# Patient Record
Sex: Female | Born: 1975 | Race: White | Hispanic: No | Marital: Married | Smoking: Former smoker
Health system: Southern US, Community
[De-identification: ages and names within clinical notes are randomized; demographics above are authoritative.]

## PROBLEM LIST (undated history)

## (undated) DIAGNOSIS — F419 Anxiety disorder, unspecified: Secondary | ICD-10-CM

## (undated) DIAGNOSIS — E079 Disorder of thyroid, unspecified: Secondary | ICD-10-CM

## (undated) DIAGNOSIS — I10 Essential (primary) hypertension: Secondary | ICD-10-CM

## (undated) DIAGNOSIS — F329 Major depressive disorder, single episode, unspecified: Secondary | ICD-10-CM

## (undated) DIAGNOSIS — F32A Depression, unspecified: Secondary | ICD-10-CM

## (undated) DIAGNOSIS — E039 Hypothyroidism, unspecified: Secondary | ICD-10-CM

## (undated) HISTORY — DX: Anxiety disorder, unspecified: F41.9

## (undated) HISTORY — DX: Hypothyroidism, unspecified: E03.9

---
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
## 2002-05-27 ENCOUNTER — Other Ambulatory Visit: Admission: RE | Admit: 2002-05-27 | Discharge: 2002-05-27 | Payer: Self-pay | Admitting: Obstetrics and Gynecology

## 2002-10-01 ENCOUNTER — Encounter: Payer: Self-pay | Admitting: Obstetrics and Gynecology

## 2002-10-01 ENCOUNTER — Ambulatory Visit (HOSPITAL_COMMUNITY): Admission: RE | Admit: 2002-10-01 | Discharge: 2002-10-01 | Payer: Self-pay | Admitting: Obstetrics and Gynecology

## 2002-12-02 ENCOUNTER — Inpatient Hospital Stay (HOSPITAL_COMMUNITY): Admission: AD | Admit: 2002-12-02 | Discharge: 2002-12-02 | Payer: Self-pay | Admitting: Obstetrics and Gynecology

## 2003-02-16 ENCOUNTER — Inpatient Hospital Stay (HOSPITAL_COMMUNITY): Admission: AD | Admit: 2003-02-16 | Discharge: 2003-02-17 | Payer: Self-pay | Admitting: Obstetrics and Gynecology

## 2003-02-25 ENCOUNTER — Inpatient Hospital Stay (HOSPITAL_COMMUNITY): Admission: AD | Admit: 2003-02-25 | Discharge: 2003-02-28 | Payer: Self-pay | Admitting: Obstetrics and Gynecology

## 2003-03-01 ENCOUNTER — Encounter: Admission: RE | Admit: 2003-03-01 | Discharge: 2003-03-31 | Payer: Self-pay | Admitting: Obstetrics and Gynecology

## 2003-08-08 ENCOUNTER — Other Ambulatory Visit: Admission: RE | Admit: 2003-08-08 | Discharge: 2003-08-08 | Payer: Self-pay | Admitting: Obstetrics and Gynecology

## 2004-06-08 ENCOUNTER — Other Ambulatory Visit: Admission: RE | Admit: 2004-06-08 | Discharge: 2004-06-08 | Payer: Self-pay | Admitting: Obstetrics and Gynecology

## 2005-06-15 ENCOUNTER — Other Ambulatory Visit: Admission: RE | Admit: 2005-06-15 | Discharge: 2005-06-15 | Payer: Self-pay | Admitting: Obstetrics and Gynecology

## 2009-04-24 ENCOUNTER — Inpatient Hospital Stay (HOSPITAL_COMMUNITY): Admission: AD | Admit: 2009-04-24 | Discharge: 2009-04-24 | Payer: Self-pay | Admitting: Obstetrics and Gynecology

## 2009-05-01 ENCOUNTER — Inpatient Hospital Stay (HOSPITAL_COMMUNITY): Admission: RE | Admit: 2009-05-01 | Discharge: 2009-05-04 | Payer: Self-pay | Admitting: Obstetrics and Gynecology

## 2009-05-01 ENCOUNTER — Encounter (INDEPENDENT_AMBULATORY_CARE_PROVIDER_SITE_OTHER): Payer: Self-pay | Admitting: Obstetrics and Gynecology

## 2010-02-14 DIAGNOSIS — M1990 Unspecified osteoarthritis, unspecified site: Secondary | ICD-10-CM

## 2010-02-14 HISTORY — DX: Unspecified osteoarthritis, unspecified site: M19.90

## 2010-05-09 LAB — CBC
Hemoglobin: 14.1 g/dL (ref 12.0–15.0)
MCHC: 33.8 g/dL (ref 30.0–36.0)
MCV: 94.6 fL (ref 78.0–100.0)
RBC: 4.42 MIL/uL (ref 3.87–5.11)
WBC: 12.1 10*3/uL — ABNORMAL HIGH (ref 4.0–10.5)
WBC: 13.1 10*3/uL — ABNORMAL HIGH (ref 4.0–10.5)

## 2010-05-09 LAB — RPR: RPR Ser Ql: NONREACTIVE

## 2010-06-18 ENCOUNTER — Other Ambulatory Visit: Payer: Self-pay | Admitting: Obstetrics and Gynecology

## 2010-06-18 ENCOUNTER — Ambulatory Visit (HOSPITAL_COMMUNITY)
Admission: RE | Admit: 2010-06-18 | Discharge: 2010-06-18 | Disposition: A | Discharge status: Home / Self Care | Payer: BC Managed Care – PPO | Source: Ambulatory Visit | Attending: Obstetrics and Gynecology | Admitting: Obstetrics and Gynecology

## 2010-06-18 DIAGNOSIS — N920 Excessive and frequent menstruation with regular cycle: Secondary | ICD-10-CM | POA: Insufficient documentation

## 2010-06-18 LAB — CBC
HCT: 40.6 % (ref 36.0–46.0)
Hemoglobin: 13.5 g/dL (ref 12.0–15.0)
MCH: 30.1 pg (ref 26.0–34.0)
MCV: 90.4 fL (ref 78.0–100.0)
Platelets: 225 10*3/uL (ref 150–400)
RBC: 4.49 MIL/uL (ref 3.87–5.11)
RDW: 12.5 % (ref 11.5–15.5)

## 2010-06-22 NOTE — Op Note (Signed)
  NAMECHERREE, Anna Glover NO.:  [id number]  MEDICAL RECORD NO.:  [id number]           PATIENT TYPE:  O  LOCATION:  [facility]                          [facility]  PHYSICIAN:  Sherron Monday, MD        DATE OF BIRTH:  1975/10/05  DATE OF PROCEDURE:  06/18/2010 DATE OF DISCHARGE:                              OPERATIVE REPORT   PREOPERATIVE DIAGNOSIS:  Menorrhagia.  POSTOPERATIVE DIAGNOSIS:  Menorrhagia.  PROCEDURE:  Diagnostic hysteroscopy, dilation and curettage, and NovaSure endometrial ablation.  SURGEON:  Sherron Monday, MD  ANESTHESIA:  General by LMA as well as 14 mL of 1% lidocaine for local anesthetic and paracervical block.  FINDINGS:  Uterus sounds to 10 cm, cervix 4 cm, NovaSure endometrial ablation time 90 seconds with a 25 mL deficit on the hysteroscopy.  SPECIMENS:  Endometrial curettings to Pathology.  ESTIMATED BLOOD LOSS:  Minimal.  IV FLUIDS:  1200 mL.  URINE OUTPUT:  I and O cath prior to the procedure. COMPLICATIONS:  None.  DISPOSITION:  Stable to PACU following the procedure.  PROCEDURE:  After informed consent was reviewed with the patient including risks, benefits, and alternatives of the procedure including but not limited to bleeding, infection, damage to her uterus, and continued bleeding, she was transported to the OR and placed on the table in supine position.  General anesthesia was introduced and the LMA placed and this was found to be adequate.  She was then placed in the Yelofin stirrups, prepped and draped in the normal sterile fashion using an open-sided speculum, her cervix was easily identified and paracervical block was placed at approximately, 12  o'clock, 5 o'clock, and 7 o'clock of 14 mL.  Her uterus was sounded to 10 cm.  Her cervix was then dilated to accommodate the hysteroscope.  A brief survey was performed revealing normal ostia no obvious polyps or fibroids.  The hysteroscope was removed from her uterus.  A D  and C was performed for grittiness in all four quadrants.  The NovaSure device was then placed during the dilatation.  Her cervix was then measured to be 4 cm.  The device was placed, tested, and the NovaSure ablation was performed.  Instruments were removed from her vagina, and the patient tolerated the procedure well.  Sponge, lap, and needle counts were correct x2 at the end of the procedure.  She was awakened in stable condition and transferred to the PACU.     Sherron Monday, MD     JB/MEDQ  D:  06/18/2010  T:  06/19/2010  Job:  [id number]  Electronically Signed by Sherron Monday MD on 06/22/2010 08:54:53 AM

## 2010-06-22 NOTE — H&P (Signed)
  NAMEDORTHIA, TOUT NO.:  [id number]  MEDICAL RECORD NO.:  [id number]           PATIENT TYPE:  O  LOCATION:  [facility]                           [facility]  PHYSICIAN:  Sherron Monday, MD        DATE OF BIRTH:  [birth year]  DATE OF ADMISSION:  05/25/2010 DATE OF DISCHARGE:                             HISTORY & PHYSICAL   ADMITTING DIAGNOSIS:  Menorrhagia.  PROCEDURE PLANNED:  Hysteroscopy, D and C, NovaSure endometrial ablation.  HISTORY OF PRESENT ILLNESS:  A 35 year old G2, P2 who presents with heavy irregular menses, desirous of long-term management as she has had a tubal ligation.  She has no desire for childbearing, does not want hormonal contraception, so the patient is desirous of NovaSure endometrial ablation, but was unable to tolerate a saline infusion ultrasound in the office or biopsy, therefore a hysteroscopy, D and C will be performed prior to the NovaSure endometrial ablation.  Discussed with the patient risks, benefits and alternatives of procedure including, but not limited to bleeding, infection, damage to surrounding organs and continued bleeding.  She voiced understanding and wishes to proceed.  PAST MEDICAL HISTORY:  Significant for irritable bowel syndrome, hypothyroid and depression.  PAST SURGICAL HISTORY:  Significant for C-section x2 as well as a tubal ligation.  PAST OB-GYN HISTORY:  G2, P0 with two term low transverse cesarean section.  MEDICATIONS:  Zoloft, Claritin and Synthroid.  ALLERGIES:  ERYTHROMYCIN AND PENICILLIN.  SOCIAL HISTORY:  Denies alcohol, tobacco or drug use.  She has no history of any abnormal Pap smears or sexually transmitted diseases. 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  FAMILY HISTORY:  Significant for heart disease and hypertension.  PHYSICAL EXAM:  VITAL SIGNS:  Afebrile.  Vital signs stable. GENERAL:  No apparent distress. CARDIOVASCULAR:  Regular rate and rhythm. LUNGS:  Clear to auscultation  bilaterally. ABDOMEN:  Soft, nontender, nondistended with good bowel sounds. EXTREMITIES:  Symmetric and nontender. PELVIC:  Normal external female genitalia.  Normal Bartholin's, urethra, Skene glands.  Cervix and vagina without lesions.  No cervical motion tenderness.  Normal uterus.  Adnexa, no masses, nontender.  ASSESSMENT AND PLAN:  A 35 year old G2, P2 for hysteroscopy, D and C and NovaSure endometrial ablation.  She voices understanding of the risks, benefits, and alternatives and wishes to proceed to control her heavy bleeding.     Sherron Monday, MD     JB/MEDQ  D:  06/17/2010  T:  06/17/2010  Job:  [id number]  Electronically Signed by Sherron Monday MD on 06/22/2010 08:54:45 AM

## 2010-07-02 NOTE — Discharge Summary (Signed)
NAME:  Anna Glover, Anna Glover                        ACCOUNT NO.:  [id number]   MEDICAL RECORD NO.:  [id number]                   PATIENT TYPE:  INP   LOCATION:  9141                                 [facility]  [facility]   PHYSICIAN:  Huel Cote, M.D.              DATE OF BIRTH:  1976/01/18   DATE OF ADMISSION:  02/25/2003  DATE OF DISCHARGE:  02/28/2003                                 DISCHARGE SUMMARY   DISCHARGE DIAGNOSES:  1. Term pregnancy at 39 weeks, delivered.  2. Status post primary low transverse cesarean section for arrest of     dilation and fetal intolerance of labor.   DISCHARGE MEDICATIONS:  1. Motrin 600 mg p.o. q.6h.  2. Percocet one to two tablets p.o. q.4h. p.r.n.   FOLLOW UP:  The patient is to follow up in the office in approximately two  weeks for an incision check.   HOSPITAL COURSE:  The patient is a 35 year old G1, P0 who was admitted at 39  weeks for induction of labor, given a favorable cervix and ongoing symptoms  of prodromal labor.  Prenatal care had been complicated by second trimester  bleeding from an endocervical polyp which resolved and also positive group B  Strep status.   Prenatal labs are as follows:  A positive, antibody negative, RPR  nonreactive, rubella immune.  Hepatitis B surface antigen negative.  HIV  negative.  GC negative.  Chlamydia negative.  Triple screen negative.  Group  B Strep positive.   PAST OBSTETRICAL HISTORY:  None.   PAST GYNECOLOGICAL HISTORY:  None.   PAST SURGICAL HISTORY:  1. Adenoidectomy.  2. Fracture of the right arm.   PAST MEDICAL HISTORY:  None.   ALLERGIES:  1. ERYTHROMYCIN.  2. SUDAFED.   On admission she was afebrile with stable vital signs.  Fetal heart rate was  reactive.  She was gravid and nontender.  Cervix was 50% effaced, 2 cm and -  1 station.  She had ruptured membranes performed with clear fluid obtained  and was placed on Pitocin per protocol.  She advanced to 4 cm and received  an  epidural for discomfort.  She then began to have much slower progress and  an IUPC was placed when the patient was approximately 5 cm for monitoring of  contractions and fetal decelerations.  The patient was noted to have fetal  heart rate with occasional variable decelerations which did respond to  positioning and oxygen.  However, had not achieved adequate labor by her  IUPC at that time.  With increasing Pitocin, it was found that the fetal  heart rate began to have decreased variability and repetitive decelerations.  Cervix reached 80%, 6 to 7 and 0 station and did not progress beyond that  despite increasing Pitocin.  The patient had a dysfunctional labor pattern  and fetal heart rate did not tolerate increasing Pitocin further.  For that  reason, we decided  to proceed with a low transverse cesarean section.  The  patient was delivered of a vigorous female infant, Apgars were 8 and 9, weight  was 7 pounds 15 ounces, and the patient did well without postoperative  complications.   On postoperative day #1, her hemoglobin was 12.2.  She continued to improve.   On postoperative day #3, was tolerating a regular diet, pain was well  controlled and she felt very well and was able to be discharged home.                                               Huel Cote, M.D.    KR/MEDQ  D:  04/07/2003  T:  04/07/2003  Job:  [id number]

## 2010-07-02 NOTE — Op Note (Signed)
NAME:  Anna Glover, Anna Glover                        ACCOUNT NO.:  [id number]   MEDICAL RECORD NO.:  [id number]                   PATIENT TYPE:  INP   LOCATION:  9141                                 [facility]   PHYSICIAN:  Huel Cote, M.D.              DATE OF BIRTH:  1975-09-24   DATE OF PROCEDURE:  02/25/2003  DATE OF DISCHARGE:                                 OPERATIVE REPORT   PREOPERATIVE DIAGNOSES:  1. Term pregnancy at 39 weeks.  2. Fetal intolerance to labor.  3. Arrest of dilation.   POSTOPERATIVE DIAGNOSES:  1. Term pregnancy at 39 weeks.  2. Fetal intolerance to labor.  3. Arrest of dilation.   PROCEDURE:  Primary low transverse cesarean section.   SURGEON:  Huel Cote, M.D.   ANESTHESIA:  Epidural.   FLUIDS:  Estimated blood loss 200 mL.   FINDINGS:  A vigorous female infant in the vertex presentation.  Apgars are 8  and 9.  Weight was 7 pounds, 15 ounces.  Normal uterus, tubes, and ovaries  were noted.   DESCRIPTION OF PROCEDURE:  The patient was taken to the operating room where  epidural anesthesia was found to be adequate by Allis clamp test.  She was  then prepped and draped in the normal sterile fashion in the dorsal supine  position with a leftward tilt.  A Pfannenstiel skin incision was then made  with the scalpel and carried through to the underlying layer of fascia with  a knife and Bovie cautery, and the fascia was then nicked in the midline.  The incision was then extended laterally with Mayo scissors, and the  inferior aspect was grasped with Kocher clamps, elevated, and dissected off  the underlying rectus muscles.  In a similar fashion, the superior aspect of  the fascia was dissected off the rectus muscle.  The rectus muscles were  then separated in the midline, the peritoneum identified, and entered  sharply.  The peritoneal incision was then extended both superiorly and  inferiorly with careful attention to avoid both bowel and  bladder.  The  lower uterine segment was then exposed, and the uterus was increased in a  transverse fashion in the lower uterine segment.  The cavity itself was  entered bluntly, and the incision was extended digitally.  The infant's head  was quite low in the birth canal and quite molded and was delivered  atraumatically and bulb suctioned with the remainder of the body delivered  without difficulty.  The cord was clamped and cut, and the infant was handed  to awaiting pediatricians.  Cord blood was then obtained, and the placenta  was delivered manually.  The uterus was cleared of all clots and debris with  a moist lap sponge, and the incision inspected carefully.  At the right  aspect of the incision, there was an extension inferiorly approximately 3 cm  below the incision.  This was grasped  with an Allis clamp and the uterus  exteriorized for better visualization.  This extension was then repaired  with 0 chromic in a running locked fashion.  The remainder of the uterine  incision was repaired in the normal fashion with 0 chromic in a running  fashion.  The incision appeared hemostatic and therefore the uterus was re-  placed in the abdomen.  The incision was again closely inspected as well as  the extension.  One small area of bleeding at the extension was controlled  with one figure-of-eight suture of 0 Vicryl.  All instruments and sponges  were then removed from the patient's abdomen, and the uterine incision was  completely hemostatic.  The subfascial planes were inspected and found to be  hemostatic; therefore, the fascia was closed with 0 Vicryl in a running  fashion.  Subcutaneous tissue was reapproximated with 3-0 plain in a  subcutaneous stitch and finally the skin was closed with staples.  Sponge,  lap, and needle counts were correct x 2, and the patient was taken to the  recovery room in stable condition.                                               Huel Cote,  M.D.    KR/MEDQ  D:  02/25/2003  T:  02/25/2003  Job:  [id number]

## 2011-09-19 ENCOUNTER — Other Ambulatory Visit: Payer: Self-pay | Admitting: Obstetrics and Gynecology

## 2011-09-19 DIAGNOSIS — N6452 Nipple discharge: Secondary | ICD-10-CM

## 2011-09-27 ENCOUNTER — Ambulatory Visit
Admission: RE | Admit: 2011-09-27 | Discharge: 2011-09-27 | Disposition: A | Discharge status: Home / Self Care | Payer: BC Managed Care – PPO | Source: Ambulatory Visit | Attending: Obstetrics and Gynecology | Admitting: Obstetrics and Gynecology

## 2011-09-27 DIAGNOSIS — N6452 Nipple discharge: Secondary | ICD-10-CM

## 2012-01-17 ENCOUNTER — Encounter (HOSPITAL_BASED_OUTPATIENT_CLINIC_OR_DEPARTMENT_OTHER): Payer: Self-pay

## 2012-01-17 ENCOUNTER — Emergency Department (HOSPITAL_BASED_OUTPATIENT_CLINIC_OR_DEPARTMENT_OTHER): Payer: BC Managed Care – PPO

## 2012-01-17 ENCOUNTER — Emergency Department (HOSPITAL_BASED_OUTPATIENT_CLINIC_OR_DEPARTMENT_OTHER)
Admission: EM | Admit: 2012-01-17 | Discharge: 2012-01-17 | Disposition: A | Discharge status: Home / Self Care | Payer: BC Managed Care – PPO | Attending: Emergency Medicine | Admitting: Emergency Medicine

## 2012-01-17 DIAGNOSIS — F411 Generalized anxiety disorder: Secondary | ICD-10-CM | POA: Insufficient documentation

## 2012-01-17 DIAGNOSIS — R0989 Other specified symptoms and signs involving the circulatory and respiratory systems: Secondary | ICD-10-CM | POA: Insufficient documentation

## 2012-01-17 DIAGNOSIS — R0789 Other chest pain: Secondary | ICD-10-CM

## 2012-01-17 DIAGNOSIS — E079 Disorder of thyroid, unspecified: Secondary | ICD-10-CM | POA: Insufficient documentation

## 2012-01-17 DIAGNOSIS — Z79899 Other long term (current) drug therapy: Secondary | ICD-10-CM | POA: Insufficient documentation

## 2012-01-17 DIAGNOSIS — F3289 Other specified depressive episodes: Secondary | ICD-10-CM | POA: Insufficient documentation

## 2012-01-17 DIAGNOSIS — F329 Major depressive disorder, single episode, unspecified: Secondary | ICD-10-CM | POA: Insufficient documentation

## 2012-01-17 DIAGNOSIS — R064 Hyperventilation: Secondary | ICD-10-CM | POA: Insufficient documentation

## 2012-01-17 DIAGNOSIS — R0609 Other forms of dyspnea: Secondary | ICD-10-CM | POA: Insufficient documentation

## 2012-01-17 DIAGNOSIS — R209 Unspecified disturbances of skin sensation: Secondary | ICD-10-CM | POA: Insufficient documentation

## 2012-01-17 HISTORY — DX: Depression, unspecified: F32.A

## 2012-01-17 HISTORY — DX: Disorder of thyroid, unspecified: E07.9

## 2012-01-17 HISTORY — DX: Major depressive disorder, single episode, unspecified: F32.9

## 2012-01-17 LAB — CBC WITH DIFFERENTIAL/PLATELET
Basophils Absolute: 0 10*3/uL (ref 0.0–0.1)
Basophils Relative: 0 % (ref 0–1)
Eosinophils Absolute: 0.2 10*3/uL (ref 0.0–0.7)
MCH: 30.9 pg (ref 26.0–34.0)
MCHC: 35.9 g/dL (ref 30.0–36.0)
Neutrophils Relative %: 59 % (ref 43–77)
Platelets: 173 10*3/uL (ref 150–400)

## 2012-01-17 LAB — COMPREHENSIVE METABOLIC PANEL
ALT: 31 U/L (ref 0–35)
Albumin: 4.3 g/dL (ref 3.5–5.2)
Alkaline Phosphatase: 58 U/L (ref 39–117)
BUN: 16 mg/dL (ref 6–23)
Potassium: 3.2 mEq/L — ABNORMAL LOW (ref 3.5–5.1)
Sodium: 141 mEq/L (ref 135–145)
Total Protein: 7.5 g/dL (ref 6.0–8.3)

## 2012-01-17 LAB — TROPONIN I: Troponin I: <0.3 ng/mL (ref <0.30)

## 2012-01-17 NOTE — ED Provider Notes (Addendum)
History     CSN: [id number]  Arrival date & time 01/17/12  2206   First MD Initiated Contact with Patient 01/17/12 2218      Chief Complaint  Patient presents with  . Chest Pain    (Consider location/radiation/quality/duration/timing/severity/associated sxs/prior treatment) HPI Comments: Patient started with pressure in the chest about one two hours ago while knitting.  She then had difficulty catching her breath and developed tingling all over.  She denies nausea or radiation to the arm or jaw.  She denies any recent exertional symptoms.  She reports to me she ran a marathon last year but has not been as active recently due to a knee injury.  She does not smoke, is not on medications for DM, HTN, or Cholesterol.  She does have a family history.    Patient is a 36 y.o. female presenting with chest pain. The history is provided by the patient.  Chest Pain The chest pain began 1 - 2 hours ago. Chest pain occurs constantly. The chest pain is improving. The pain is associated with breathing. The severity of the pain is severe. The quality of the pain is described as pressure-like. The pain does not radiate.     Past Medical History  Diagnosis Date  . Thyroid disease   . Depression     Past Surgical History  Procedure Date  . Cesarean section     No family history on file.  History  Substance Use Topics  . Smoking status: Never Smoker   . Smokeless tobacco: Not on file  . Alcohol Use: No    OB History    Grav Para Term Preterm Abortions TAB SAB Ect Mult Living                  Review of Systems  Cardiovascular: Positive for chest pain.  All other systems reviewed and are negative.    Allergies  Erythromycin and Penicillins  Home Medications   Current Outpatient Rx  Name  Route  Sig  Dispense  Refill  . LEVOTHYROXINE SODIUM 137 MCG PO TABS   Oral   Take 137 mcg by mouth daily.         . SERTRALINE HCL 50 MG PO TABS   Oral   Take 50 mg by mouth  daily.           BP 126/72  Pulse 67  Temp 97.5 F (36.4 C) (Oral)  Resp 20  Ht 6' (1.829 m)  Wt 215 lb (97.523 kg)  BMI 29.16 kg/m2  SpO2 100%  LMP 12/27/2011  Physical Exam  Nursing note and vitals reviewed. Constitutional: She is oriented to person, place, and time. She appears well-developed and well-nourished. No distress.       Patient is anxious and hyperventilating.    HENT:  Head: Normocephalic and atraumatic.  Neck: Normal range of motion. Neck supple.  Cardiovascular: Normal rate and regular rhythm.  Exam reveals no gallop and no friction rub.   No murmur heard. Pulmonary/Chest: Effort normal and breath sounds normal. No respiratory distress. She has no wheezes.  Abdominal: Soft. Bowel sounds are normal. She exhibits no distension. There is no tenderness.  Musculoskeletal: Normal range of motion. She exhibits no edema.  Neurological: She is alert and oriented to person, place, and time.  Skin: Skin is warm and dry. She is not diaphoretic.    ED Course  Procedures (including critical care time)   Labs Reviewed  CBC WITH DIFFERENTIAL  COMPREHENSIVE METABOLIC  PANEL  TROPONIN I   No results found.   No diagnosis found.   Date: 01/17/2012  Rate: 62  Rhythm: normal sinus rhythm  QRS Axis: normal  Intervals: normal  ST/T Wave abnormalities: normal  Conduction Disutrbances:none  Narrative Interpretation:   Old EKG Reviewed: none available    MDM  The patient presents here with tightness in the chest, shortness of breath, and tingling in her extremities.  She was hyperventilating when she arrived here and looked every bit a panic attack.  EKG was normal and the troponin is negative.  She is now feeling better.  I do not believe that any further workup is indicated at this time.  Doubt cardiac etiology.  Also doubt PE.  Her oxygen sats are 100% and she is not tachycardic.  She will be discharged to home, to return prn.     Geoffery Lyons, MD 01/17/12  4696  Geoffery Lyons, MD 01/17/12 2325

## 2012-01-17 NOTE — ED Notes (Signed)
CP x 1 hour-episode of same one week ago-did not seek medical treatment at that time

## 2012-01-17 NOTE — ED Notes (Signed)
Patient transported to X-ray 

## 2012-07-15 HISTORY — PX: CHOLECYSTECTOMY: SHX55

## 2013-07-10 ENCOUNTER — Encounter (INDEPENDENT_AMBULATORY_CARE_PROVIDER_SITE_OTHER): Payer: Self-pay | Admitting: General Surgery

## 2013-07-10 ENCOUNTER — Ambulatory Visit (INDEPENDENT_AMBULATORY_CARE_PROVIDER_SITE_OTHER): Payer: BC Managed Care – PPO | Admitting: General Surgery

## 2013-07-10 VITALS — BP 120/78 | HR 70 | Temp 97.6°F | Resp 16 | Ht 72.0 in | Wt 200.4 lb

## 2013-07-10 DIAGNOSIS — R222 Localized swelling, mass and lump, trunk: Secondary | ICD-10-CM

## 2013-07-10 DIAGNOSIS — R1900 Intra-abdominal and pelvic swelling, mass and lump, unspecified site: Secondary | ICD-10-CM

## 2013-07-10 NOTE — Progress Notes (Signed)
Patient ID: Anna Glover, female   DOB: Jul 17, 1975, 38 y.o.   MRN: [id number]  Chief Complaint  Patient presents with  . New Evaluation    eval hernia VS lipoma abd wall    HPI Anna Glover is a 38 y.o. female.  Patient is a 38 year old female who was referred by Dr. Elmon KirschnerBrovard secondary 2 right lower quadrant nodule. This is just medial to her ASIS. She states it is tender to touch at times. Patient states that it has slowly gotten bigger over the last 2 months. HPI  Past Medical History  Diagnosis Date  . Thyroid disease   . Depression     Past Surgical History  Procedure Laterality Date  . Cesarean section      History reviewed. No pertinent family history.  Social History History  Substance Use Topics  . Smoking status: Former Smoker    Quit date: 07/10/2009  . Smokeless tobacco: Not on file  . Alcohol Use: 0.6 oz/week    1 Glasses of wine per week     Comment: occ    Allergies  Allergen Reactions  . Cefdinir Other (See Comments)    Rectal bleeding  . Erythromycin Nausea And Vomiting  . Penicillins Hives  . Sulfur Other (See Comments)    Rectal bleeding    Current Outpatient Prescriptions  Medication Sig Dispense Refill  . Biotin 1000 MCG tablet Take 1,000 mcg by mouth 1 day or 1 dose.      . levothyroxine (SYNTHROID, LEVOTHROID) 137 MCG tablet Take 137 mcg by mouth daily.      . Loratadine (CLARITIN) 10 MG CAPS Take by mouth 1 day or 1 dose.      Marland Kitchen. Lysine 1000 MG TABS Take by mouth.      . sertraline (ZOLOFT) 50 MG tablet Take 50 mg by mouth daily.       No current facility-administered medications for this visit.    Review of Systems Review of Systems  Constitutional: Negative.   HENT: Negative.   Respiratory: Negative.   Cardiovascular: Negative.   Gastrointestinal: Negative.   Neurological: Negative.   All other systems reviewed and are negative.   Blood pressure 120/78, pulse 70, temperature 97.6 F (36.4 C), temperature source  Temporal, resp. rate 16, height 6' (1.829 m), weight 200 lb 6.4 oz (90.901 kg).  Physical Exam Physical Exam  Constitutional: She is oriented to person, place, and time. She appears well-developed and well-nourished.  HENT:  Head: Normocephalic and atraumatic.  Eyes: Conjunctivae and EOM are normal. Pupils are equal, round, and reactive to light.  Neck: Normal range of motion. Neck supple.  Cardiovascular: Normal rate, regular rhythm and normal heart sounds.   Pulmonary/Chest: Effort normal and breath sounds normal.  Abdominal: Soft. Bowel sounds are normal. She exhibits no distension and no mass. There is no tenderness. There is no rebound and no guarding. Hernia confirmed negative in the ventral area.    2 x 2 centimeter, mobile, nontender mass  Musculoskeletal: Normal range of motion.  Neurological: She is alert and oriented to person, place, and time.  Skin: Skin is warm and dry.  Psychiatric: She has a normal mood and affect.    Data Reviewed none  Assessment    38 year old female with a right lower quadrant abdominal wall mass, versus cyst, versus lymph node    Plan    1. Will obtain an ultrasound of this nodule to evaluate for possible lipoma versus cyst versus lymph node. Once we  obtain the results we will call patient with the results and discuss possible further excision of this mass.        Axel Filler 07/10/2013, 3:44 PM

## 2013-07-10 NOTE — Addendum Note (Signed)
Addended by: Maryan Puls on: 07/10/2013 05:36 PM   Modules accepted: Orders

## 2013-07-11 ENCOUNTER — Telehealth (INDEPENDENT_AMBULATORY_CARE_PROVIDER_SITE_OTHER): Payer: Self-pay | Admitting: *Deleted

## 2013-07-11 NOTE — Telephone Encounter (Signed)
LM for pt to return my call regarding her [place]. Please advise pt it is scheduled at [facility] [address]  Appt: 07/12/13 arrive at 10:15 and NPO after midnight tonight.  Thanks!  Victorino Dike

## 2013-07-11 NOTE — Telephone Encounter (Signed)
Patient called back and the appt information below was given.  Patient verbalized understanding.

## 2013-07-12 ENCOUNTER — Other Ambulatory Visit (INDEPENDENT_AMBULATORY_CARE_PROVIDER_SITE_OTHER): Payer: Self-pay | Admitting: General Surgery

## 2013-07-12 ENCOUNTER — Ambulatory Visit
Admission: RE | Admit: 2013-07-12 | Discharge: 2013-07-12 | Disposition: A | Discharge status: Home / Self Care | Payer: BC Managed Care – PPO | Source: Ambulatory Visit | Attending: General Surgery | Admitting: General Surgery

## 2013-07-12 DIAGNOSIS — R222 Localized swelling, mass and lump, trunk: Secondary | ICD-10-CM

## 2013-07-15 ENCOUNTER — Telehealth (INDEPENDENT_AMBULATORY_CARE_PROVIDER_SITE_OTHER): Payer: Self-pay

## 2013-07-15 NOTE — Telephone Encounter (Signed)
Message copied by Maryan Puls on Mon Jul 15, 2013  9:23 AM ------      Message from: Axel Filler      Created: Sat Jul 13, 2013  8:40 AM       Can you call her and let her know that it is likely a lipoma.  If she wants surgery we can set it up and she doesn't have to come in.            Let me know            AR      ----- Message -----         From: Rad Results In Interface         Sent: 07/12/2013  11:49 AM           To: Axel Filler, MD                   ------

## 2013-07-15 NOTE — Telephone Encounter (Signed)
Called and left message for patient to call our office RE:  US findings are likely a Lipoma.  If patient would like to proceed with excision we can have our OR schedulers call to schedule her surgery.

## 2013-07-15 NOTE — Telephone Encounter (Signed)
Patient return call to the office:  Patient given [place] results (Lipoma) at this time she will not proceed with surgical excision.  Patient advised to call our office if she decides to proceed with surgery.

## 2014-06-01 ENCOUNTER — Encounter (HOSPITAL_COMMUNITY): Payer: Self-pay

## 2014-06-01 ENCOUNTER — Emergency Department (HOSPITAL_COMMUNITY)
Admission: EM | Admit: 2014-06-01 | Discharge: 2014-06-01 | Disposition: A | Discharge status: Home / Self Care | Payer: BC Managed Care – PPO | Attending: Emergency Medicine | Admitting: Emergency Medicine

## 2014-06-01 DIAGNOSIS — Z792 Long term (current) use of antibiotics: Secondary | ICD-10-CM | POA: Insufficient documentation

## 2014-06-01 DIAGNOSIS — Z79899 Other long term (current) drug therapy: Secondary | ICD-10-CM | POA: Insufficient documentation

## 2014-06-01 DIAGNOSIS — R519 Headache, unspecified: Secondary | ICD-10-CM

## 2014-06-01 DIAGNOSIS — Z880 Allergy status to penicillin: Secondary | ICD-10-CM | POA: Insufficient documentation

## 2014-06-01 DIAGNOSIS — R51 Headache: Secondary | ICD-10-CM | POA: Diagnosis present

## 2014-06-01 DIAGNOSIS — Z87891 Personal history of nicotine dependence: Secondary | ICD-10-CM | POA: Insufficient documentation

## 2014-06-01 DIAGNOSIS — M542 Cervicalgia: Secondary | ICD-10-CM | POA: Insufficient documentation

## 2014-06-01 DIAGNOSIS — E079 Disorder of thyroid, unspecified: Secondary | ICD-10-CM | POA: Diagnosis not present

## 2014-06-01 DIAGNOSIS — F329 Major depressive disorder, single episode, unspecified: Secondary | ICD-10-CM | POA: Diagnosis not present

## 2014-06-01 LAB — CBC WITH DIFFERENTIAL/PLATELET
BASOS ABS: 0.1 10*3/uL (ref 0.0–0.1)
Basophils Relative: 2 % — ABNORMAL HIGH (ref 0–1)
Eosinophils Absolute: 0.2 10*3/uL (ref 0.0–0.7)
Eosinophils Relative: 4 % (ref 0–5)
HCT: 40.9 % (ref 36.0–46.0)
HEMOGLOBIN: 14.2 g/dL (ref 12.0–15.0)
Lymphocytes Relative: 46 % (ref 12–46)
Lymphs Abs: 2.6 10*3/uL (ref 0.7–4.0)
MCH: 31.5 pg (ref 26.0–34.0)
MCHC: 34.7 g/dL (ref 30.0–36.0)
MCV: 90.7 fL (ref 78.0–100.0)
Monocytes Absolute: 0.6 10*3/uL (ref 0.1–1.0)
Monocytes Relative: 12 % (ref 3–12)
NEUTROS PCT: 36 % — AB (ref 43–77)
Neutro Abs: 1.9 10*3/uL (ref 1.7–7.7)
PLATELETS: 108 10*3/uL — AB (ref 150–400)
RBC: 4.51 MIL/uL (ref 3.87–5.11)
RDW: 13.1 % (ref 11.5–15.5)
WBC: 5.4 10*3/uL (ref 4.0–10.5)

## 2014-06-01 LAB — CSF CELL COUNT WITH DIFFERENTIAL
RBC Count, CSF: 0 /mm3
RBC Count, CSF: 0 /mm3
TUBE #: 4
Tube #: 1
WBC, CSF: 0 /mm3 (ref 0–5)
WBC, CSF: 1 /mm3 (ref 0–5)

## 2014-06-01 LAB — PROTEIN, CSF: Total  Protein, CSF: 25 mg/dL (ref 15–45)

## 2014-06-01 LAB — COMPREHENSIVE METABOLIC PANEL
ALT: 74 U/L — ABNORMAL HIGH (ref 0–35)
ANION GAP: 9 (ref 5–15)
AST: 62 U/L — ABNORMAL HIGH (ref 0–37)
Albumin: 3.4 g/dL — ABNORMAL LOW (ref 3.5–5.2)
Alkaline Phosphatase: 112 U/L (ref 39–117)
BILIRUBIN TOTAL: 0.9 mg/dL (ref 0.3–1.2)
BUN: 11 mg/dL (ref 6–23)
CALCIUM: 9 mg/dL (ref 8.4–10.5)
CHLORIDE: 105 mmol/L (ref 96–112)
CO2: 23 mmol/L (ref 19–32)
Creatinine, Ser: 0.76 mg/dL (ref 0.50–1.10)
GFR calc Af Amer: >90 mL/min (ref >90)
Glucose, Bld: 87 mg/dL (ref 70–99)
POTASSIUM: 3.7 mmol/L (ref 3.5–5.1)
Sodium: 137 mmol/L (ref 135–145)
Total Protein: 6.5 g/dL (ref 6.0–8.3)

## 2014-06-01 LAB — GLUCOSE, CSF: Glucose, CSF: 45 mg/dL (ref 43–76)

## 2014-06-01 LAB — I-STAT CG4 LACTIC ACID, ED
LACTIC ACID, VENOUS: 0.83 mmol/L (ref 0.5–2.0)
Lactic Acid, Venous: 0.67 mmol/L (ref 0.5–2.0)

## 2014-06-01 LAB — GRAM STAIN

## 2014-06-01 MED ORDER — BUTALBITAL-APAP-CAFFEINE 50-325-40 MG PO TABS: 1.0000 | ORAL_TABLET | Freq: Four times a day (QID) | ORAL | Status: AC | PRN

## 2014-06-01 MED ORDER — DIPHENHYDRAMINE HCL 50 MG/ML IJ SOLN
25.0000 mg | Freq: Once | INTRAMUSCULAR | Status: AC
Start: 2014-06-01 — End: 2014-06-01
  Administered 2014-06-01: 25 mg via INTRAVENOUS
  Filled 2014-06-01: qty 1

## 2014-06-01 MED ORDER — METOCLOPRAMIDE HCL 5 MG/ML IJ SOLN
10.0000 mg | Freq: Once | INTRAMUSCULAR | Status: AC
  Administered 2014-06-01: 10 mg via INTRAVENOUS
  Filled 2014-06-01: qty 2

## 2014-06-01 MED ORDER — SODIUM CHLORIDE 0.9 % IV BOLUS (SEPSIS)
1000.0000 mL | Freq: Once | INTRAVENOUS | Status: AC
  Administered 2014-06-01: 1000 mL via INTRAVENOUS

## 2014-06-01 MED ORDER — LIDOCAINE-EPINEPHRINE 2 %-1:100000 IJ SOLN: 20.0000 mL | Freq: Once | INTRAMUSCULAR | Status: DC

## 2014-06-01 MED ORDER — LIDOCAINE-EPINEPHRINE (PF) 2 %-1:200000 IJ SOLN
10.0000 mL | Freq: Once | INTRAMUSCULAR | Status: AC
  Administered 2014-06-01: 10 mL
  Filled 2014-06-01: qty 20

## 2014-06-01 MED ORDER — LIDOCAINE HCL (PF) 1 % IJ SOLN
5.0000 mL | Freq: Once | INTRAMUSCULAR | Status: DC
  Filled 2014-06-01: qty 5

## 2014-06-01 MED ORDER — ONDANSETRON 4 MG PO TBDP: 4.0000 mg | ORAL_TABLET | Freq: Three times a day (TID) | ORAL | Status: DC | PRN

## 2014-06-01 NOTE — ED Provider Notes (Signed)
CSN: [id number]     Arrival date & time 06/01/14  1022 History   First MD Initiated Contact with Patient 06/01/14 1044     Chief Complaint  Patient presents with  . Neck Pain  . Headache     (Consider location/radiation/quality/duration/timing/severity/associated sxs/prior Treatment) The history is provided by the patient and medical records.    This is a 39 year old female with past medical history significant for hypothyroidism, depression, presenting to the ED for headache and neck pain.  Patient states neck pain began on Monday 05/26/14.  States she attributed it to "sleeping wrong" but when she developed headache and fever the next day she went to see her PCP.   She had multiple lab tests performed including HIV, RPR, lyme titers, RMSF titers, TSH, lactic acid, etc.  Patient stated her PCP did express some concern for meningitis, but felt more likely to be due to infection. She was started on Augmentin and allergy medicine for sinusitis. 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  Lyme titers also negative.  She was started on doxycycline.  States she has taken 5 doses of the medication thus far.  States she does have some nausea, unsure if related to headache or SE of medications.  No hx of migraine headaches.  She reports associated photophobia, phonophobia, dizziness, lightheadedness, changes in speech, confusion.  Endorses some continued subjective fever and chills.  No sweats.  No chest pain or SOB.  VSS.  Past Medical History  Diagnosis Date  . Thyroid disease   . Depression    Past Surgical History  Procedure Laterality Date  . Cesarean section     No family history on file. History  Substance Use Topics  . Smoking status: Former Smoker    Quit date: 07/10/2009  . Smokeless tobacco: Not on file  . Alcohol Use: 0.6 oz/week    1 Glasses of wine per week     Comment: occ   OB History    No data  available     Review of Systems  Musculoskeletal: Positive for neck pain.  Neurological: Positive for headaches.  All other systems reviewed and are negative.     Allergies  Cefdinir; Erythromycin; Penicillins; and Sulfur  Home Medications   Prior to Admission medications   Medication Sig Start Date End Date Taking? Authorizing Provider  azithromycin (ZITHROMAX) 250 MG tablet  05/28/14   Historical Provider, MD  Biotin 1000 MCG tablet Take 1,000 mcg by mouth 1 day or 1 dose.    Historical Provider, MD  celecoxib (CELEBREX) 200 MG capsule  05/08/14   Historical Provider, MD  levothyroxine (SYNTHROID, LEVOTHROID) 137 MCG tablet Take 137 mcg by mouth daily.    Historical Provider, MD  Loratadine (CLARITIN) 10 MG CAPS Take by mouth 1 day or 1 dose.    Historical Provider, MD  Lysine 1000 MG TABS Take by mouth.    Historical Provider, MD  sertraline (ZOLOFT) 50 MG tablet Take 50 mg by mouth daily.    Historical Provider, MD   BP 131/71 mmHg  Pulse 56  Temp(Src) 98.4 F (36.9 C) (Oral)  Resp 16  Ht 6' (1.829 m)  Wt 192 lb (87.091 kg)  BMI 26.03 kg/m2  SpO2 100%  LMP 05/13/2014   Physical Exam  Constitutional: She is oriented to person, place, and time. She appears well-developed and well-nourished.  HENT:  Head: Normocephalic and atraumatic.  Mouth/Throat: Oropharynx is clear and moist.  Eyes: Conjunctivae and EOM are normal. Pupils are equal, round, and reactive to light.  Neck: Trachea normal and phonation normal. Neck supple. No spinous process tenderness and no muscular tenderness present.  Limited ROM of neck due to pain, pain greatest with flexion; no reproducible spinal or muscular tenderness  Cardiovascular: Normal rate, regular rhythm and normal heart sounds.   Pulmonary/Chest: Effort normal and breath sounds normal.  Abdominal: Soft. Bowel sounds are normal.  Musculoskeletal: Normal range of motion.  Neurological: She is alert and oriented to person, place, and  time.  AAOx3, answering questions and following commands appropriately; equal strength UE and LE bilaterally; CN grossly intact; moves all extremities appropriately without ataxia; no focal neuro deficits or facial asymmetry appreciated  Skin: Skin is warm and dry. No rash noted.  No visible rash or skin lesions  Psychiatric: She has a normal mood and affect.  Nursing note and vitals reviewed.   ED Course  Procedures (including critical care time) Labs Review Labs Reviewed  CBC WITH DIFFERENTIAL/PLATELET - Abnormal; Notable for the following:    Platelets 108 (*)    Neutrophils Relative % 36 (*)    Basophils Relative 2 (*)    All other components within normal limits  COMPREHENSIVE METABOLIC PANEL - Abnormal; Notable for the following:    Albumin 3.4 (*)    AST 62 (*)    ALT 74 (*)    All other components within normal limits  GRAM STAIN  CSF CULTURE  CULTURE, BLOOD (ROUTINE X 2)  CULTURE, BLOOD (ROUTINE X 2)  CSF CELL COUNT WITH DIFFERENTIAL  CSF CELL COUNT WITH DIFFERENTIAL  GLUCOSE, CSF  PROTEIN, CSF  I-STAT CG4 LACTIC ACID, ED  I-STAT CG4 LACTIC ACID, ED    Imaging Review No results found.   EKG Interpretation None      MDM   Final diagnoses:  Headache, unspecified headache type   39 year old female with headache, neck pain, and subjective fever. She was recently diagnosed with large amounts body fever and is currently taking doxycycline. On exam, she is afebrile and overall nontoxic in appearance.  She has limited range of motion of her neck due to pain, greatest discomfort with flexion. She has no reproducible muscular or spinal tenderness. Her neurologic exam is nonfocal.  Given known ongoing infection, will obtain basic labs and LP to r/o meningitis.  Patient given IVF with benadryl/reglan.    After migraine cocktail patient states she is feeling better, currently eating snack in room.  Basic lab work reassuring.  LP performed by Dr. Rosalia Hammersay-- CSF without signs of  infection.  Will d/c home with supportive care, patient to continue doxycycline for RMSF.  She has previously scheduled FU with PCP tomm.  Discussed plan with patient, he/she acknowledged understanding and agreed with plan of care.  Return precautions given for new or worsening symptoms  Garlon HatchetLisa M Sanders, PA-C 06/01/14 1515  Margarita Grizzleanielle Ray, MD 06/01/14 [phone]

## 2014-06-01 NOTE — ED Provider Notes (Signed)
39 year old female who presents today complaining of headache. She had a tick bite recently has been seen in her primary care office. She had [facility] and is started on doxycycline this week. She presents today complaining of headache and neck pain. Patient was seen with Ms. Sanders. Please see her note for full evaluation. Lumbar puncture performed by myself - patient informed of risks and benefits and consent obtained. Patient placed in left lateral decubitus position. Back had landmarks identified. Prepped and draped in usual sterile manner. Lidocaine with epinephrine 2 mL used for local anesthesia. #22 with a needle entered the L4-L5 space with return of clear fluid. Opening pressure was 22 4 -tubes1 mL each of fluid obtained. Stylette replaced the needle removed. Bandage placed. Tolerated procedure well.   I performed a history and physical examination of Anna Glover and discussed her management with Ms. Sanders.  I agree with the history, physical, assessment, and plan of care, with the following exceptions: None  I was present for the following procedures: None Time Spent in Critical Care of the patient: None Time spent in discussions with the patient and family: 3415  Anna Glover Hilario QuarryS    Romero Letizia, MD 06/01/14 [phone]

## 2014-06-01 NOTE — ED Notes (Signed)
Pt. With ongoing neck pain and headache x 1 week. Pt. Was bitten by a tic in the groin on 4/3. Pt. Seen at PCP Friday. Pt. Running high fever last week. Pt. Taking doxycycline for possible [place] spotted fever. Pt. Feeling nauseous/tingly.

## 2014-06-01 NOTE — ED Notes (Signed)
MD at bedside for lumbar puncture.

## 2014-06-01 NOTE — Discharge Instructions (Signed)
Take the prescribed medication as directed as needed for recurrent headache and/or nausea.  Continue taking your doxycycline as prescribed. Follow-up with your primary care physician-- recommend to call in the morning. Return to the ED for new or worsening symptoms.

## 2014-06-03 ENCOUNTER — Inpatient Hospital Stay (HOSPITAL_COMMUNITY)
Admission: EM | Admit: 2014-06-03 | Discharge: 2014-06-04 | DRG: 103 | Disposition: A | Discharge status: Home / Self Care | Payer: BC Managed Care – PPO | Attending: Internal Medicine | Admitting: Internal Medicine

## 2014-06-03 ENCOUNTER — Observation Stay (HOSPITAL_COMMUNITY): Payer: BC Managed Care – PPO

## 2014-06-03 ENCOUNTER — Encounter (HOSPITAL_COMMUNITY): Payer: Self-pay | Admitting: *Deleted

## 2014-06-03 DIAGNOSIS — E079 Disorder of thyroid, unspecified: Secondary | ICD-10-CM | POA: Diagnosis present

## 2014-06-03 DIAGNOSIS — Z880 Allergy status to penicillin: Secondary | ICD-10-CM

## 2014-06-03 DIAGNOSIS — R519 Headache, unspecified: Secondary | ICD-10-CM | POA: Diagnosis present

## 2014-06-03 DIAGNOSIS — G971 Other reaction to spinal and lumbar puncture: Secondary | ICD-10-CM | POA: Diagnosis not present

## 2014-06-03 DIAGNOSIS — Z87891 Personal history of nicotine dependence: Secondary | ICD-10-CM

## 2014-06-03 DIAGNOSIS — Z888 Allergy status to other drugs, medicaments and biological substances status: Secondary | ICD-10-CM

## 2014-06-03 DIAGNOSIS — A770 Spotted fever due to Rickettsia rickettsii: Secondary | ICD-10-CM

## 2014-06-03 DIAGNOSIS — Z882 Allergy status to sulfonamides status: Secondary | ICD-10-CM

## 2014-06-03 DIAGNOSIS — D696 Thrombocytopenia, unspecified: Secondary | ICD-10-CM | POA: Diagnosis not present

## 2014-06-03 DIAGNOSIS — R51 Headache: Secondary | ICD-10-CM

## 2014-06-03 DIAGNOSIS — R7989 Other specified abnormal findings of blood chemistry: Secondary | ICD-10-CM | POA: Diagnosis not present

## 2014-06-03 DIAGNOSIS — Z881 Allergy status to other antibiotic agents status: Secondary | ICD-10-CM

## 2014-06-03 DIAGNOSIS — R945 Abnormal results of liver function studies: Secondary | ICD-10-CM

## 2014-06-03 LAB — COMPREHENSIVE METABOLIC PANEL
ALBUMIN: 3.6 g/dL (ref 3.5–5.2)
ALT: 186 U/L — AB (ref 0–35)
ANION GAP: 7 (ref 5–15)
AST: 206 U/L — AB (ref 0–37)
Alkaline Phosphatase: 113 U/L (ref 39–117)
BILIRUBIN TOTAL: 0.8 mg/dL (ref 0.3–1.2)
BUN: 10 mg/dL (ref 6–23)
CHLORIDE: 107 mmol/L (ref 96–112)
CO2: 25 mmol/L (ref 19–32)
Calcium: 8.9 mg/dL (ref 8.4–10.5)
Creatinine, Ser: 0.85 mg/dL (ref 0.50–1.10)
GFR calc Af Amer: >90 mL/min (ref >90)
GFR calc non Af Amer: 85 mL/min — ABNORMAL LOW (ref >90)
Glucose, Bld: 87 mg/dL (ref 70–99)
Potassium: 3.8 mmol/L (ref 3.5–5.1)
Sodium: 139 mmol/L (ref 135–145)
TOTAL PROTEIN: 6.2 g/dL (ref 6.0–8.3)

## 2014-06-03 LAB — CBC WITH DIFFERENTIAL/PLATELET
BASOS ABS: 0 10*3/uL (ref 0.0–0.1)
BASOS PCT: 1 % (ref 0–1)
EOS ABS: 0.2 10*3/uL (ref 0.0–0.7)
EOS PCT: 2 % (ref 0–5)
HEMATOCRIT: 37.3 % (ref 36.0–46.0)
HEMOGLOBIN: 12.3 g/dL (ref 12.0–15.0)
LYMPHS ABS: 3.1 10*3/uL (ref 0.7–4.0)
Lymphocytes Relative: 42 % (ref 12–46)
MCH: 30.5 pg (ref 26.0–34.0)
MCHC: 33 g/dL (ref 30.0–36.0)
MCV: 92.6 fL (ref 78.0–100.0)
Monocytes Absolute: 0.4 10*3/uL (ref 0.1–1.0)
Monocytes Relative: 6 % (ref 3–12)
Neutro Abs: 3.7 10*3/uL (ref 1.7–7.7)
Neutrophils Relative %: 49 % (ref 43–77)
Platelets: 131 10*3/uL — ABNORMAL LOW (ref 150–400)
RBC: 4.03 MIL/uL (ref 3.87–5.11)
RDW: 13 % (ref 11.5–15.5)
WBC: 7.4 10*3/uL (ref 4.0–10.5)

## 2014-06-03 LAB — URINALYSIS, ROUTINE W REFLEX MICROSCOPIC
BILIRUBIN URINE: NEGATIVE
Glucose, UA: NEGATIVE mg/dL
Hgb urine dipstick: NEGATIVE
Ketones, ur: NEGATIVE mg/dL
LEUKOCYTES UA: NEGATIVE
Nitrite: NEGATIVE
PROTEIN: NEGATIVE mg/dL
Specific Gravity, Urine: 1.014 (ref 1.005–1.030)
Urobilinogen, UA: 2 mg/dL — ABNORMAL HIGH (ref 0.0–1.0)
pH: 6 (ref 5.0–8.0)

## 2014-06-03 LAB — APTT: APTT: 27 s (ref 24–37)

## 2014-06-03 LAB — PROTIME-INR
INR: 1.03 (ref 0.00–1.49)
Prothrombin Time: 13.6 seconds (ref 11.6–15.2)

## 2014-06-03 MED ORDER — HYDROMORPHONE HCL 1 MG/ML IJ SOLN: 1.0000 mg | INTRAMUSCULAR | Status: DC | PRN

## 2014-06-03 MED ORDER — SODIUM CHLORIDE 0.9 % IV SOLN: 250.0000 mL | INTRAVENOUS | Status: DC | PRN

## 2014-06-03 MED ORDER — ACETAMINOPHEN 325 MG PO TABS
650.0000 mg | ORAL_TABLET | Freq: Four times a day (QID) | ORAL | Status: DC | PRN
  Filled 2014-06-03: qty 2

## 2014-06-03 MED ORDER — SODIUM CHLORIDE 0.9 % IV BOLUS (SEPSIS)
1000.0000 mL | Freq: Once | INTRAVENOUS | Status: AC
  Administered 2014-06-03: 1000 mL via INTRAVENOUS

## 2014-06-03 MED ORDER — SODIUM CHLORIDE 0.9 % IJ SOLN: 3.0000 mL | INTRAMUSCULAR | Status: DC | PRN

## 2014-06-03 MED ORDER — DIPHENHYDRAMINE HCL 50 MG/ML IJ SOLN
25.0000 mg | Freq: Three times a day (TID) | INTRAMUSCULAR | Status: DC
  Administered 2014-06-03 – 2014-06-04 (×5): 25 mg via INTRAVENOUS
  Filled 2014-06-03 (×5): qty 1

## 2014-06-03 MED ORDER — PROCHLORPERAZINE EDISYLATE 5 MG/ML IJ SOLN
10.0000 mg | Freq: Once | INTRAMUSCULAR | Status: AC
  Administered 2014-06-03: 10 mg via INTRAVENOUS
  Filled 2014-06-03: qty 2

## 2014-06-03 MED ORDER — SERTRALINE HCL 50 MG PO TABS
50.0000 mg | ORAL_TABLET | Freq: Every day | ORAL | Status: DC
  Administered 2014-06-03 – 2014-06-04 (×2): 50 mg via ORAL
  Filled 2014-06-03 (×2): qty 1

## 2014-06-03 MED ORDER — ACETAMINOPHEN 650 MG RE SUPP
650.0000 mg | Freq: Four times a day (QID) | RECTAL | Status: DC | PRN
Start: 2014-06-03 — End: 2014-06-05

## 2014-06-03 MED ORDER — SODIUM CHLORIDE 0.9 % IV SOLN
INTRAVENOUS | Status: DC
  Administered 2014-06-03: 19:00:00 via INTRAVENOUS

## 2014-06-03 MED ORDER — SERTRALINE HCL 50 MG PO TABS: 50.0000 mg | ORAL_TABLET | Freq: Every day | ORAL | Status: DC

## 2014-06-03 MED ORDER — HYDROMORPHONE HCL 1 MG/ML IJ SOLN
0.5000 mg | Freq: Once | INTRAMUSCULAR | Status: DC
  Filled 2014-06-03: qty 1

## 2014-06-03 MED ORDER — LORAZEPAM 2 MG/ML IJ SOLN
1.0000 mg | Freq: Once | INTRAMUSCULAR | Status: AC
Start: 2014-06-03 — End: 2014-06-03
  Administered 2014-06-03: 1 mg via INTRAVENOUS
  Filled 2014-06-03: qty 1

## 2014-06-03 MED ORDER — HYDROMORPHONE HCL 1 MG/ML IJ SOLN
0.5000 mg | Freq: Once | INTRAMUSCULAR | Status: AC
  Administered 2014-06-03: 0.5 mg via INTRAVENOUS

## 2014-06-03 MED ORDER — DIPHENHYDRAMINE HCL 50 MG/ML IJ SOLN
25.0000 mg | Freq: Once | INTRAMUSCULAR | Status: AC
  Administered 2014-06-03: 25 mg via INTRAVENOUS
  Filled 2014-06-03: qty 1

## 2014-06-03 MED ORDER — METHYLPREDNISOLONE SODIUM SUCC 125 MG IJ SOLR
125.0000 mg | Freq: Once | INTRAMUSCULAR | Status: AC
  Administered 2014-06-03: 125 mg via INTRAVENOUS
  Filled 2014-06-03: qty 2

## 2014-06-03 MED ORDER — KETOROLAC TROMETHAMINE 30 MG/ML IJ SOLN
30.0000 mg | Freq: Three times a day (TID) | INTRAMUSCULAR | Status: DC
  Administered 2014-06-03 – 2014-06-04 (×5): 30 mg via INTRAVENOUS
  Filled 2014-06-03 (×5): qty 1

## 2014-06-03 MED ORDER — ACETAMINOPHEN 325 MG PO TABS
650.0000 mg | ORAL_TABLET | Freq: Four times a day (QID) | ORAL | Status: DC | PRN
  Administered 2014-06-04: 650 mg via ORAL

## 2014-06-03 MED ORDER — METOCLOPRAMIDE HCL 5 MG/ML IJ SOLN
10.0000 mg | Freq: Three times a day (TID) | INTRAMUSCULAR | Status: DC
  Administered 2014-06-03 – 2014-06-04 (×4): 10 mg via INTRAVENOUS
  Filled 2014-06-03 (×4): qty 2

## 2014-06-03 MED ORDER — ONDANSETRON HCL 4 MG/2ML IJ SOLN: 4.0000 mg | Freq: Three times a day (TID) | INTRAMUSCULAR | Status: AC | PRN

## 2014-06-03 MED ORDER — LORATADINE 10 MG PO TABS
10.0000 mg | ORAL_TABLET | Freq: Every day | ORAL | Status: DC
  Administered 2014-06-03: 10 mg via ORAL
  Filled 2014-06-03 (×3): qty 1

## 2014-06-03 MED ORDER — MAGNESIUM SULFATE 2 GM/50ML IV SOLN
2.0000 g | Freq: Once | INTRAVENOUS | Status: AC
  Administered 2014-06-03: 2 g via INTRAVENOUS
  Filled 2014-06-03: qty 50

## 2014-06-03 MED ORDER — LEVOTHYROXINE SODIUM 137 MCG PO TABS
137.0000 ug | ORAL_TABLET | Freq: Every day | ORAL | Status: DC
  Administered 2014-06-04: 137 ug via ORAL
  Filled 2014-06-03 (×4): qty 1

## 2014-06-03 MED ORDER — SODIUM CHLORIDE 0.9 % IJ SOLN
3.0000 mL | Freq: Two times a day (BID) | INTRAMUSCULAR | Status: DC
  Administered 2014-06-03 – 2014-06-04 (×2): 3 mL via INTRAVENOUS

## 2014-06-03 MED ORDER — DOXYCYCLINE HYCLATE 100 MG PO TABS
100.0000 mg | ORAL_TABLET | Freq: Two times a day (BID) | ORAL | Status: DC
  Administered 2014-06-03 – 2014-06-04 (×3): 100 mg via ORAL
  Filled 2014-06-03 (×3): qty 1

## 2014-06-03 MED ORDER — VALPROATE SODIUM 500 MG/5ML IV SOLN: 500.0000 mg | Freq: Once | INTRAVENOUS | Status: DC

## 2014-06-03 NOTE — H&P (Signed)
[facility] History and Physical  ZALEIGH BERMINGHAM ZOX:[id number] DOB: 1975/05/19 DOA: 06/03/2014  Referring physician: er PCP: Pcp Not In System   Chief Complaint: headache  HPI: Anna Glover is a 39 y.o. 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  She was started on doxy on 4/16.  She has had headache since 4/14.  Headache worsened on 4/17 and patient presented to the ER with headache and concern for meninigitis.  LP was negative.  Headache is better laying flat but has always improved with this.  Worse with sitting up, no change since LP done +photophobia and +sensitivities to sound.  No fevers since abx started.  In ER, her LFTs were elevated and headache did not improve with dilaudid, benadryl, steroids, magnesium and compazine.  Hospitalist were asked to consult   Review of Systems:  All systems reviewed, negative unless stated above    Past Medical History  Diagnosis Date  . Thyroid disease   . Depression    Past Surgical History  Procedure Laterality Date  . Cesarean section     Social History:  reports that she quit smoking about 4 years ago. She does not have any smokeless tobacco history on file. She reports that she drinks about 0.6 oz of alcohol per week. She reports that she does not use illicit drugs.  Allergies  Allergen Reactions  . Cefdinir Other (See Comments)    Rectal bleeding  . Erythromycin Nausea And Vomiting  . Penicillins Hives  . Sulfur Other (See Comments)    Rectal bleeding    Family History  Problem Relation Age of Onset  . Hypertension Mother   . Hyperlipidemia Mother   . Hypertension Father      Prior to Admission medications   Medication Sig Start Date End Date Taking? Authorizing Provider  acetaminophen (TYLENOL) 325 MG tablet Take 650 mg by mouth every 6 (six) hours as needed for mild pain or moderate pain.   Yes Historical Provider, MD  doxycycline (VIBRA-TABS) 100 MG tablet Take 100 mg by mouth 2 (two)  times daily.   Yes Historical Provider, MD  Hydrocodone-Acetaminophen 5-300 MG TABS Take 1 tablet by mouth daily as needed (headache).   Yes Historical Provider, MD  ibuprofen (ADVIL,MOTRIN) 200 MG tablet Take 200 mg by mouth every 6 (six) hours as needed for mild pain.   Yes Historical Provider, MD  levothyroxine (SYNTHROID, LEVOTHROID) 137 MCG tablet Take 137 mcg by mouth daily.   Yes Historical Provider, MD  Loratadine (CLARITIN) 10 MG CAPS Take 10 mg by mouth daily.    Yes Historical Provider, MD  sertraline (ZOLOFT) 50 MG tablet Take 50 mg by mouth daily.   Yes Historical Provider, MD  butalbital-acetaminophen-caffeine (FIORICET) 50-325-40 MG per tablet Take 1 tablet by mouth every 6 (six) hours as needed for headache. Patient not taking: Reported on 06/03/2014 06/01/14 06/01/15  Garlon Hatchet, PA-C  ondansetron (ZOFRAN ODT) 4 MG disintegrating tablet Take 1 tablet (4 mg total) by mouth every 8 (eight) hours as needed for nausea. Patient not taking: Reported on 06/03/2014 06/01/14   Garlon Hatchet, PA-C   Physical Exam: Filed Vitals:   06/03/14 1530 06/03/14 1545 06/03/14 1600 06/03/14 1615  BP: 94/46 107/61 110/61 119/68  Pulse: 51 49 49 81  Temp:      TempSrc:      Resp:      Weight:      SpO2: 96% 97% 96% 97%    Wt  Readings from Last 3 Encounters:  06/03/14 87.091 kg (192 lb)  06/01/14 87.091 kg (192 lb)  07/10/13 90.901 kg (200 lb 6.4 oz)    General:  Eyes closed- sensitive to the light, approriate Eyes: PERRL, normal lids, irises & conjunctiva ENT: grossly normal hearing, lips & tongue Neck: no LAD, masses or thyromegaly Cardiovascular: RRR, no m/r/g. No LE edema. Respiratory: CTA bilaterally, no w/r/r. Normal respiratory effort. Abdomen: soft, ntnd Skin: no rash or induration seen on limited exam Musculoskeletal: grossly normal tone BUE/BLE Psychiatric: grossly normal mood and affect, speech fluent and appropriate Neurologic: grossly non-focal.          Labs on  Admission:  Basic Metabolic Panel:  Recent Labs Lab 06/01/14 1054 06/03/14 1355  NA 137 139  K 3.7 3.8  CL 105 107  CO2 23 25  GLUCOSE 87 87  BUN 11 10  CREATININE 0.76 0.85  CALCIUM 9.0 8.9   Liver Function Tests:  Recent Labs Lab 06/01/14 1054 06/03/14 1355  AST 62* 206*  ALT 74* 186*  ALKPHOS 112 113  BILITOT 0.9 0.8  PROT 6.5 6.2  ALBUMIN 3.4* 3.6   No results for input(s): LIPASE, AMYLASE in the last 168 hours. No results for input(s): AMMONIA in the last 168 hours. CBC:  Recent Labs Lab 06/01/14 1054 06/03/14 1355  WBC 5.4 7.4  NEUTROABS 1.9 3.7  HGB 14.2 12.3  HCT 40.9 37.3  MCV 90.7 92.6  PLT 108* 131*   Cardiac Enzymes: No results for input(s): CKTOTAL, CKMB, CKMBINDEX, TROPONINI in the last 168 hours.  BNP (last 3 results) No results for input(s): BNP in the last 8760 hours.  ProBNP (last 3 results) No results for input(s): PROBNP in the last 8760 hours.  CBG: No results for input(s): GLUCAP in the last 168 hours.  Radiological Exams on Admission: No results found.  EKG: Independently reviewed. pending  Assessment/Plan Active Problems:   Headache   [facility]   Thrombocytopenia   Elevated LFTs  Headache: -? Post LP -?migraine Appreciate neuro -MRI pending -headache cocktail -unable to give Depacon due to elevated LFTs  RMSF: -continue doxy -started 4/16  thrombocytopenia -trend -low from RMSF?  Elevated LFTs -trend  Neuro following  Code Status: full DVT Prophylaxis: Family Communication:  Disposition Plan:   Time spent: 65 min  Marlin CanaryVANN, JESSICA [facility] Pager [phone]

## 2014-06-03 NOTE — Consult Note (Signed)
NEURO HOSPITALIST CONSULT NOTE    Reason for Consult: HA S/P LP  HPI:                                                                                                                                          Anna Glover is an 39 y.o. female who was recently diagnosed with [place] spotted fever.  She  received a LP on Sunday. Per patient prior to LP she had a HA which she states was positional and the same as the HA she is having today.  Prior to arriving to ED she states she had a 10/10 HA with both photophobia and phonophobia. Currently she has received benadryl, compazine, solumedrol and magnesium which has decreased HA to a 3/10.  HA remains positional. No numbness, tingling, weakness, speech abnormalities.   Past Medical History  Diagnosis Date  . Thyroid disease   . Depression     Past Surgical History  Procedure Laterality Date  . Cesarean section      Family History  Problem Relation Age of Onset  . Hypertension Mother   . Hyperlipidemia Mother   . Hypertension Father      Social History:  reports that she quit smoking about 4 years ago. She does not have any smokeless tobacco history on file. She reports that she drinks about 0.6 oz of alcohol per week. She reports that she does not use illicit drugs.  Allergies  Allergen Reactions  . Cefdinir Other (See Comments)    Rectal bleeding  . Erythromycin Nausea And Vomiting  . Penicillins Hives  . Sulfur Other (See Comments)    Rectal bleeding    MEDICATIONS:                                                                                                                     Current Facility-Administered Medications  Medication Dose Route Frequency Provider Last Rate Last Dose  . diphenhydrAMINE (BENADRYL) injection 25 mg  25 mg Intravenous 3 times per day Ramond Marrow, DO      . HYDROmorphone (DILAUDID) injection 1 mg  1 mg Intravenous Q4H PRN Nicole Pisciotta, PA-C      . ketorolac  (TORADOL) 30 MG/ML injection 30 mg  30 mg Intravenous 3 times  per day Ramond Marrow, DO      . metoCLOPramide (REGLAN) injection 10 mg  10 mg Intravenous 3 times per day Ramond Marrow, DO      . ondansetron [facility]) injection 4 mg  4 mg Intravenous Q8H PRN Nicole Pisciotta, PA-C      . valproate (DEPACON) 500 mg in dextrose 5 % 50 mL IVPB  500 mg Intravenous Once Ramond Marrow, DO       Current Outpatient Prescriptions  Medication Sig Dispense Refill  . acetaminophen (TYLENOL) 325 MG tablet Take 650 mg by mouth every 6 (six) hours as needed for mild pain or moderate pain.    Marland Kitchen doxycycline (VIBRA-TABS) 100 MG tablet Take 100 mg by mouth 2 (two) times daily.    . Hydrocodone-Acetaminophen 5-300 MG TABS Take 1 tablet by mouth daily as needed (headache).    Marland Kitchen ibuprofen (ADVIL,MOTRIN) 200 MG tablet Take 200 mg by mouth every 6 (six) hours as needed for mild pain.    Marland Kitchen levothyroxine (SYNTHROID, LEVOTHROID) 137 MCG tablet Take 137 mcg by mouth daily.    . Loratadine (CLARITIN) 10 MG CAPS Take 10 mg by mouth daily.     . sertraline (ZOLOFT) 50 MG tablet Take 50 mg by mouth daily.    . butalbital-acetaminophen-caffeine (FIORICET) 50-325-40 MG per tablet Take 1 tablet by mouth every 6 (six) hours as needed for headache. (Patient not taking: Reported on 06/03/2014) 20 tablet 0  . ondansetron (ZOFRAN ODT) 4 MG disintegrating tablet Take 1 tablet (4 mg total) by mouth every 8 (eight) hours as needed for nausea. (Patient not taking: Reported on 06/03/2014) 10 tablet 0      ROS:                                                                                                                                       History obtained from the patient  General ROS: negative for - chills, fatigue, fever, night sweats, weight gain or weight loss Psychological ROS: negative for - behavioral disorder, hallucinations, memory difficulties, mood swings or suicidal ideation Ophthalmic ROS: negative for - blurry vision,  double vision, eye pain or loss of vision ENT ROS: negative for - epistaxis, nasal discharge, oral lesions, sore throat, tinnitus or vertigo Allergy and Immunology ROS: negative for - hives or itchy/watery eyes Hematological and Lymphatic ROS: negative for - bleeding problems, bruising or swollen lymph nodes Endocrine ROS: negative for - galactorrhea, hair pattern changes, polydipsia/polyuria or temperature intolerance Respiratory ROS: negative for - cough, hemoptysis, shortness of breath or wheezing Cardiovascular ROS: negative for - chest pain, dyspnea on exertion, edema or irregular heartbeat Gastrointestinal ROS: negative for - abdominal pain, diarrhea, hematemesis, nausea/vomiting or stool incontinence Genito-Urinary ROS: negative for - dysuria, hematuria, incontinence or urinary frequency/urgency Musculoskeletal ROS: negative for - joint swelling or muscular weakness Neurological ROS: as noted in HPI Dermatological ROS: negative for rash  and skin lesion changes   Blood pressure 104/50, pulse 55, temperature 98.4 F (36.9 C), temperature source Oral, resp. rate 17, weight 87.091 kg (192 lb), last menstrual period 05/13/2014, SpO2 99 %.   Neurologic Examination:                                                                                                      HEENT-  Normocephalic, no lesions, without obvious abnormality.  Normal external eye and conjunctiva.  Normal TM's bilaterally.  Normal auditory canals and external ears. Normal external nose, mucus membranes and septum.  Normal pharynx. Cardiovascular- S1, S2 normal, pulses palpable throughout   Lungs- chest clear, no wheezing, rales, normal symmetric air entry Abdomen- normal findings: bowel sounds normal Extremities- no edema Lymph-no adenopathy palpable Musculoskeletal-no joint tenderness, deformity or swelling Skin-warm and dry, no hyperpigmentation, vitiligo, or suspicious lesions  Neurological Examination Mental  Status: Alert, oriented, thought content appropriate.  Speech fluent without evidence of aphasia.  Able to follow 3 step commands without difficulty. Cranial Nerves: II: Discs flat bilaterally; Visual fields grossly normal, pupils equal, round, reactive to light and accommodation III,IV, VI: ptosis not present, extra-ocular motions intact bilaterally V,VII: smile symmetric, facial light touch sensation normal bilaterally VIII: hearing normal bilaterally IX,X: uvula rises symmetrically XI: bilateral shoulder shrug XII: midline tongue extension Motor: Right : Upper extremity   5/5    Left:     Upper extremity   5/5  Lower extremity   5/5     Lower extremity   5/5 Tone and bulk:normal tone throughout; no atrophy noted Sensory: Pinprick and light touch intact throughout, bilaterally Deep Tendon Reflexes: 2+ and symmetric throughout Plantars: Right: downgoing   Left: downgoing Cerebellar: normal finger-to-nose, normal rapid alternating movements and normal heel-to-shin test Gait: not tested due to safety      Lab Results: Basic Metabolic Panel:  Recent Labs Lab 06/01/14 1054 06/03/14 1355  NA 137 139  K 3.7 3.8  CL 105 107  CO2 23 25  GLUCOSE 87 87  BUN 11 10  CREATININE 0.76 0.85  CALCIUM 9.0 8.9    Liver Function Tests:  Recent Labs Lab 06/01/14 1054 06/03/14 1355  AST 62* 206*  ALT 74* 186*  ALKPHOS 112 113  BILITOT 0.9 0.8  PROT 6.5 6.2  ALBUMIN 3.4* 3.6   No results for input(s): LIPASE, AMYLASE in the last 168 hours. No results for input(s): AMMONIA in the last 168 hours.  CBC:  Recent Labs Lab 06/01/14 1054 06/03/14 1355  WBC 5.4 7.4  NEUTROABS 1.9 3.7  HGB 14.2 12.3  HCT 40.9 37.3  MCV 90.7 92.6  PLT 108* 131*    Cardiac Enzymes: No results for input(s): CKTOTAL, CKMB, CKMBINDEX, TROPONINI in the last 168 hours.  Lipid Panel: No results for input(s): CHOL, TRIG, HDL, CHOLHDL, VLDL, LDLCALC in the last 168 hours.  CBG: No results for  input(s): GLUCAP in the last 168 hours.  Microbiology: Results for orders placed or performed during the hospital encounter of 06/01/14  Culture, blood (routine x 2)  Status: None (Preliminary result)   Collection Time: 06/01/14 11:55 AM  Result Value Ref Range Status   Specimen Description BLOOD LEFT ANTECUBITAL  Final   Special Requests BOTTLES DRAWN AEROBIC AND ANAEROBIC 10CC  Final   Culture   Final           BLOOD CULTURE RECEIVED NO GROWTH TO DATE CULTURE WILL BE HELD FOR 5 DAYS BEFORE ISSUING A FINAL NEGATIVE REPORT Performed at [facility]    Report Status PENDING  Incomplete  Culture, blood (routine x 2)     Status: None (Preliminary result)   Collection Time: 06/01/14 12:00 PM  Result Value Ref Range Status   Specimen Description BLOOD RIGHT ARM  Final   Special Requests BOTTLES DRAWN AEROBIC AND ANAEROBIC 10 CC  Final   Culture   Final           BLOOD CULTURE RECEIVED NO GROWTH TO DATE CULTURE WILL BE HELD FOR 5 DAYS BEFORE ISSUING A FINAL NEGATIVE REPORT Performed at [facility]    Report Status PENDING  Incomplete  CSF culture     Status: None (Preliminary result)   Collection Time: 06/01/14  1:11 PM  Result Value Ref Range Status   Specimen Description CSF BACK  Final   Special Requests NONE  Final   Gram Stain   Final    CYTOSPIN NO WBC SEEN NO ORGANISMS SEEN Performed at [facility] Performed at [facility]    Culture   Final    NO GROWTH 2 DAYS Performed at [facility]    Report Status PENDING  Incomplete  Gram stain     Status: None   Collection Time: 06/01/14  1:11 PM  Result Value Ref Range Status   Specimen Description CSF BACK  Final   Special Requests NONE  Final   Gram Stain NO WBC SEEN NO ORGANISMS SEEN CYTOSPIN SLIDE  Final   Report Status 06/01/2014 FINAL  Final    Coagulation Studies:  Recent Labs  06/03/14 1355  LABPROT 13.6  INR 1.03    Imaging: No results found.     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  Exam is non focal and no neuroimaging is available. Likely related to RMSF but cannot rule out post LP HA versus migraine.   Recommend: 1) MRV and MRI head 2) Continue to treat symptomatically with Toradol, Benadryl and Reglan.  3) If no improvement would consider referral for blood patch  Elspeth Cho, DO [facility] [phone]  If 7pm- 7am, please page neurology on call as listed in [facility].

## 2014-06-03 NOTE — ED Notes (Signed)
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 Pt reports having continued headache and body aches. Pt states that she has had a lumbar punture to rule out meningitis as well. Pt was seen by her pcp yesterday for same and was given Vicodin. Pt was told to come here for IV antibiotics if symptoms were not relieved

## 2014-06-03 NOTE — ED Provider Notes (Signed)
CSN: [id number]     Arrival date & time 06/03/14  1207 History   First MD Initiated Contact with Patient 06/03/14 1238     Chief Complaint  Patient presents with  . Headache     (Consider location/radiation/quality/duration/timing/severity/associated sxs/prior Treatment) HPI   Anna MichaelYvonne M Loyal is a 39 y.o. female complaining of a severe headache and neck pain.  She was bitten by a tick on 4/3 and [facility] at her PCP.   She returned to her PCP on 4/15 with severe headache and 3 days of fever (ranged from 102-104F).  She was given oral doxycycline.  [facility] on 4/17 with continued HA and neck pain; r/o meningitis.  She went back to her PCP yesterday and received vicodin for the pain, and was instructed to come to the ED if it didn't relieve her pain.  She describes the headache as 10/10 stabbing pain on top of her head, on her forehead, and neck pain that makes it difficult to move her head.  The pain is made worse when standing or sitting up.  She is exquisitely light and sound sensitive.  She gets some relief from laying down.  Headache exacerbation was always worse with sitting up even before the LP. She has experienced dizziness, blurry vision and shortness of breath.  She has had migraines in the past, but it has been years since she has experienced one and she says headaches are not typical for her.  She denies chest pain, diarrhea, vomiting, or body aches.  Her last bowel movement was last Thursday.    PCP [facility]  Past Medical History  Diagnosis Date  . Thyroid disease   . Depression    Past Surgical History  Procedure Laterality Date  . Cesarean section     No family history on file. History  Substance Use Topics  . Smoking status: Former Smoker    Quit date: 07/10/2009  . Smokeless tobacco: Not on file  . Alcohol Use: 0.6 oz/week    1 Glasses of wine per week     Comment: occ   OB History    No data  available     Review of Systems  10 systems reviewed and found to be negative, except as noted in the HPI.  Allergies  Cefdinir; Erythromycin; Penicillins; and Sulfur  Home Medications   Prior to Admission medications   Medication Sig Start Date End Date Taking? Authorizing Provider  acetaminophen (TYLENOL) 325 MG tablet Take 650 mg by mouth every 6 (six) hours as needed for mild pain or moderate pain.   Yes Historical Provider, MD  doxycycline (VIBRA-TABS) 100 MG tablet Take 100 mg by mouth 2 (two) times daily.   Yes Historical Provider, MD  Hydrocodone-Acetaminophen 5-300 MG TABS Take 1 tablet by mouth daily as needed (headache).   Yes Historical Provider, MD  ibuprofen (ADVIL,MOTRIN) 200 MG tablet Take 200 mg by mouth every 6 (six) hours as needed for mild pain.   Yes Historical Provider, MD  levothyroxine (SYNTHROID, LEVOTHROID) 137 MCG tablet Take 137 mcg by mouth daily.   Yes Historical Provider, MD  Loratadine (CLARITIN) 10 MG CAPS Take 10 mg by mouth daily.    Yes Historical Provider, MD  sertraline (ZOLOFT) 50 MG tablet Take 50 mg by mouth daily.   Yes Historical Provider, MD  butalbital-acetaminophen-caffeine (FIORICET) 50-325-40 MG per tablet Take 1 tablet by mouth every 6 (six) hours as needed  for headache. Patient not taking: Reported on 06/03/2014 06/01/14 06/01/15  Garlon Hatchet, PA-C  ondansetron (ZOFRAN ODT) 4 MG disintegrating tablet Take 1 tablet (4 mg total) by mouth every 8 (eight) hours as needed for nausea. Patient not taking: Reported on 06/03/2014 06/01/14   Garlon Hatchet, PA-C   BP 104/50 mmHg  Pulse 55  Temp(Src) 98.4 F (36.9 C) (Oral)  Resp 17  Wt 192 lb (87.091 kg)  SpO2 99%  LMP 05/13/2014 Physical Exam  Constitutional: She is oriented to person, place, and time. She appears well-developed and well-nourished. No distress.  Tearful, eyes closed  HENT:  Head: Normocephalic.  Mouth/Throat: Oropharynx is clear and moist.  Eyes: Conjunctivae and EOM are  normal. Pupils are equal, round, and reactive to light.  Neck: Normal range of motion.  Cardiovascular: Normal rate, regular rhythm and intact distal pulses.   Pulmonary/Chest: Effort normal and breath sounds normal. No stridor. No respiratory distress. She has no wheezes. She has no rales. She exhibits no tenderness.  Abdominal: Soft. Bowel sounds are normal. She exhibits no distension and no mass. There is no tenderness. There is no rebound and no guarding.  Musculoskeletal: Normal range of motion.  Neurological: She is alert and oriented to person, place, and time.  II-Visual fields grossly intact. III/IV/VI-Extraocular movements intact.  Pupils reactive bilaterally. V/VII-Smile symmetric, equal eyebrow raise,  facial sensation intact VIII- Hearing grossly intact IX/X-Normal gag XI-bilateral shoulder shrug XII-midline tongue extension Motor: 5/5 bilaterally with normal tone and bulk Cerebellar: Normal finger-to-nose  and normal heel-to-shin test.   Romberg negative Ambulates with a coordinated gait   Nursing note and vitals reviewed.   ED Course  Procedures (including critical care time) Labs Review Labs Reviewed  CBC WITH DIFFERENTIAL/PLATELET - Abnormal; Notable for the following:    Platelets 131 (*)    All other components within normal limits  COMPREHENSIVE METABOLIC PANEL - Abnormal; Notable for the following:    AST 206 (*)    ALT 186 (*)    GFR calc non Af Amer 85 (*)    All other components within normal limits  APTT  PROTIME-INR  URINALYSIS, ROUTINE W REFLEX MICROSCOPIC    Imaging Review No results found.   EKG Interpretation None      MDM   Final diagnoses:  Acute intractable headache, unspecified headache type  RMSF [facility] spotted fever)   Filed Vitals:   06/03/14 1415 06/03/14 1430 06/03/14 1500 06/03/14 1515  BP: 103/59 97/44 105/56 104/50  Pulse: 51 48 49 55  Temp:      TempSrc:      Resp:  17 17   Weight:      SpO2: 96% 100% 96%  99%   Medications  sodium chloride 0.9 % bolus 1,000 mL (1,000 mLs Intravenous New Bag/Given 06/03/14 1530)  HYDROmorphone (DILAUDID) injection 1 mg (not administered)  ondansetron (ZOFRAN) injection 4 mg (not administered)  sodium chloride 0.9 % bolus 1,000 mL (0 mLs Intravenous Stopped 06/03/14 1525)  prochlorperazine (COMPAZINE) injection 10 mg (10 mg Intravenous Given 06/03/14 1340)  diphenhydrAMINE (BENADRYL) injection 25 mg (25 mg Intravenous Given 06/03/14 1343)  methylPREDNISolone sodium succinate (SOLU-MEDROL) 125 mg/2 mL injection 125 mg (125 mg Intravenous Given 06/03/14 1347)  LORazepam (ATIVAN) injection 1 mg (1 mg Intravenous Given 06/03/14 1349)  magnesium sulfate IVPB 2 g 50 mL (2 g Intravenous New Bag/Given 06/03/14 1446)  HYDROmorphone (DILAUDID) injection 0.5 mg (0.5 mg Intravenous Given 06/03/14 1522)   Anna Glover is  a pleasant 39 y.o. female presenting with severe unrelenting headache. Patient was recently diagnosed document with [place] spotted fever. She is afebrile in the ED. She is taking Vicodin at home with little relief. Patient is given headache cocktail with little relief. Magnesium given with some early swelling down but severe headache exacerbation when sitting up. I do not think this is a LP headache she states that the headache was also always exacerbated by sitting upright.   Patient is given half milligram Dilaudid, she states that her headache is moderate when sitting up. LFTs are elevated consistent with RMSF. Patient will need admission for IV pain medication.  Patient will be an unassigned admission to [facility] hospitalist Dr. Benjamine Mola who requests head CT and neuro consult.   Neurology consult from Dr. Hosie Poisson appreciated: He will come to see the patient.  This is a shared visit with the attending physician who personally evaluated the patient and agrees with the care plan.     Wynetta Emery, PA-C 06/03/14 1612  Elwin Mocha, MD 06/03/14 [phone]

## 2014-06-04 ENCOUNTER — Observation Stay (HOSPITAL_COMMUNITY): Payer: BC Managed Care – PPO

## 2014-06-04 ENCOUNTER — Other Ambulatory Visit: Payer: Self-pay | Admitting: Radiology

## 2014-06-04 DIAGNOSIS — Z882 Allergy status to sulfonamides status: Secondary | ICD-10-CM | POA: Diagnosis not present

## 2014-06-04 DIAGNOSIS — Z888 Allergy status to other drugs, medicaments and biological substances status: Secondary | ICD-10-CM | POA: Diagnosis not present

## 2014-06-04 DIAGNOSIS — Z87891 Personal history of nicotine dependence: Secondary | ICD-10-CM | POA: Diagnosis not present

## 2014-06-04 DIAGNOSIS — R7989 Other specified abnormal findings of blood chemistry: Secondary | ICD-10-CM | POA: Diagnosis not present

## 2014-06-04 DIAGNOSIS — G971 Other reaction to spinal and lumbar puncture: Secondary | ICD-10-CM | POA: Diagnosis present

## 2014-06-04 DIAGNOSIS — R51 Headache: Secondary | ICD-10-CM | POA: Diagnosis present

## 2014-06-04 DIAGNOSIS — Z880 Allergy status to penicillin: Secondary | ICD-10-CM | POA: Diagnosis not present

## 2014-06-04 DIAGNOSIS — E079 Disorder of thyroid, unspecified: Secondary | ICD-10-CM | POA: Diagnosis present

## 2014-06-04 DIAGNOSIS — Z881 Allergy status to other antibiotic agents status: Secondary | ICD-10-CM | POA: Diagnosis not present

## 2014-06-04 DIAGNOSIS — D696 Thrombocytopenia, unspecified: Secondary | ICD-10-CM | POA: Diagnosis not present

## 2014-06-04 DIAGNOSIS — A770 Spotted fever due to Rickettsia rickettsii: Secondary | ICD-10-CM | POA: Diagnosis not present

## 2014-06-04 LAB — COMPREHENSIVE METABOLIC PANEL
ALT: 206 U/L — AB (ref 0–35)
AST: 184 U/L — ABNORMAL HIGH (ref 0–37)
Albumin: 3 g/dL — ABNORMAL LOW (ref 3.5–5.2)
Alkaline Phosphatase: 96 U/L (ref 39–117)
Anion gap: 10 (ref 5–15)
BUN: 11 mg/dL (ref 6–23)
CO2: 21 mmol/L (ref 19–32)
Calcium: 8.7 mg/dL (ref 8.4–10.5)
Chloride: 107 mmol/L (ref 96–112)
Creatinine, Ser: 0.83 mg/dL (ref 0.50–1.10)
GFR calc Af Amer: >90 mL/min (ref >90)
GFR calc non Af Amer: 88 mL/min — ABNORMAL LOW (ref >90)
Glucose, Bld: 110 mg/dL — ABNORMAL HIGH (ref 70–99)
Potassium: 4.4 mmol/L (ref 3.5–5.1)
SODIUM: 138 mmol/L (ref 135–145)
TOTAL PROTEIN: 5.7 g/dL — AB (ref 6.0–8.3)
Total Bilirubin: 0.8 mg/dL (ref 0.3–1.2)

## 2014-06-04 LAB — CBC
HCT: 35.5 % — ABNORMAL LOW (ref 36.0–46.0)
Hemoglobin: 11.8 g/dL — ABNORMAL LOW (ref 12.0–15.0)
MCH: 30.3 pg (ref 26.0–34.0)
MCHC: 33.2 g/dL (ref 30.0–36.0)
MCV: 91 fL (ref 78.0–100.0)
PLATELETS: 149 10*3/uL — AB (ref 150–400)
RBC: 3.9 MIL/uL (ref 3.87–5.11)
RDW: 13 % (ref 11.5–15.5)
WBC: 11 10*3/uL — ABNORMAL HIGH (ref 4.0–10.5)

## 2014-06-04 MED ORDER — TOPIRAMATE 25 MG PO TABS: 25.0000 mg | ORAL_TABLET | Freq: Every day | ORAL | Status: DC

## 2014-06-04 MED ORDER — ONDANSETRON HCL 4 MG/2ML IJ SOLN: 4.0000 mg | Freq: Four times a day (QID) | INTRAMUSCULAR | Status: DC | PRN

## 2014-06-04 MED ORDER — IOHEXOL 180 MG/ML  SOLN
20.0000 mL | Freq: Once | INTRAMUSCULAR | Status: AC | PRN
  Administered 2014-06-04: 1 mL via INTRAVENOUS

## 2014-06-04 MED ORDER — TOPIRAMATE 25 MG PO TABS
25.0000 mg | ORAL_TABLET | Freq: Every day | ORAL | Status: DC
  Administered 2014-06-04: 25 mg via ORAL
  Filled 2014-06-04: qty 1

## 2014-06-04 MED ORDER — OXYCODONE HCL 5 MG PO TABS: 5.0000 mg | ORAL_TABLET | Freq: Four times a day (QID) | ORAL | Status: DC | PRN

## 2014-06-04 MED ORDER — LIDOCAINE HCL (PF) 1 % IJ SOLN
INTRAMUSCULAR | Status: AC
  Filled 2014-06-04: qty 10

## 2014-06-04 MED ORDER — GADOBENATE DIMEGLUMINE 529 MG/ML IV SOLN
18.0000 mL | Freq: Once | INTRAVENOUS | Status: AC | PRN
  Administered 2014-06-04: 18 mL via INTRAVENOUS

## 2014-06-04 MED ORDER — SODIUM CHLORIDE 0.9 % IJ SOLN
INTRAMUSCULAR | Status: AC
  Filled 2014-06-04: qty 10

## 2014-06-04 MED ORDER — METHYLPREDNISOLONE 4 MG PO TBPK: ORAL_TABLET | ORAL | Status: DC

## 2014-06-04 NOTE — Discharge Summary (Signed)
PATIENT DETAILS Name: Anna Glover Age: 39 y.o. Sex: female Date of Birth: 1975/04/25 MRN: [id number]. Admitting Physician: Joseph Art, DO WUJ:[id number], DOROTHY, MD  Admit Date: 06/03/2014 Discharge date: 06/04/2014  Recommendations for Outpatient Follow-up:  1. New medication: Topamax, and Medrol Dosepak 2. Ensure follow-up with neurology-up appointment already made  3. Please repeat LFTs and CBC within 1 week.  PRIMARY DISCHARGE DIAGNOSIS:  Active Problems:   Headache   [place] spotted fever   Thrombocytopenia   Elevated LFTs      PAST MEDICAL HISTORY: Past Medical History  Diagnosis Date  . Thyroid disease   . Depression     DISCHARGE MEDICATIONS: Current Discharge Medication List    START taking these medications   Details  methylPREDNISolone (MEDROL DOSEPAK) 4 MG TBPK tablet Use as instructed Qty: 21 tablet, Refills: 0    oxyCODONE (ROXICODONE) 5 MG immediate release tablet Take 1 tablet (5 mg total) by mouth every 6 (six) hours as needed for severe pain. Qty: 20 tablet, Refills: 0    topiramate (TOPAMAX) 25 MG tablet Take 1 tablet (25 mg total) by mouth at bedtime. Qty: 30 tablet, Refills: 0      CONTINUE these medications which have NOT CHANGED   Details  doxycycline (VIBRA-TABS) 100 MG tablet Take 100 mg by mouth 2 (two) times daily.    ibuprofen (ADVIL,MOTRIN) 200 MG tablet Take 200 mg by mouth every 6 (six) hours as needed for mild pain.    levothyroxine (SYNTHROID, LEVOTHROID) 137 MCG tablet Take 137 mcg by mouth daily.    Loratadine (CLARITIN) 10 MG CAPS Take 10 mg by mouth daily.     sertraline (ZOLOFT) 50 MG tablet Take 50 mg by mouth daily.    butalbital-acetaminophen-caffeine (FIORICET) 50-325-40 MG per tablet Take 1 tablet by mouth every 6 (six) hours as needed for headache. Qty: 20 tablet, Refills: 0      STOP taking these medications     acetaminophen (TYLENOL) 325 MG tablet      Hydrocodone-Acetaminophen 5-300 MG  TABS      ondansetron (ZOFRAN ODT) 4 MG disintegrating tablet         ALLERGIES:   Allergies  Allergen Reactions  . Cefdinir Other (See Comments)    Rectal bleeding  . Erythromycin Nausea And Vomiting  . Penicillins Hives  . Sulfur Other (See Comments)    Rectal bleeding    BRIEF HPI:  See H&P, Labs, Consult and Test reports for all details in brief, patient is a 39 year old female who was recently diagnosed with a [facility] spotted fever by her PCP and started on doxycycline. She underwent LP on 4/17. She presented back to the emergency room with worsening headache. She was subsequently admitted for further evaluation and treatment  CONSULTATIONS:   neurology  PERTINENT RADIOLOGIC STUDIES: Mr Brain Wo Contrast  06/04/2014   CLINICAL DATA:  Recently diagnosed with [facility] spotted fever, status post lumbar puncture 2 days ago, subsequent positional 10/10 headache, photophobia and phonophobia.  EXAM: MRI HEAD WITHOUT CONTRAST  MRV HEAD WITHOUT CONTRAST  TECHNIQUE: Multiplanar, multiecho pulse sequences of the brain and surrounding structures were obtained without intravenous contrast. Angiographic images of the intracranial venous structures were obtained using MRV technique without intravenous contrast.  COMPARISON:  None.  FINDINGS: MRI head: The ventricles and sulci are normal for patient's age. No abnormal parenchymal signal, mass lesions, mass effect. No reduced diffusion to suggest acute ischemia. No susceptibility artifact to suggest hemorrhage.  No abnormal  extra-axial fluid collections. No extra-axial masses though, contrast enhanced sequences would be more sensitive. Normal major intracranial vascular flow voids seen at the skull base.  Ocular globes and orbital contents are unremarkable though not tailored for evaluation. No abnormal sellar expansion. RIGHT maxillary mucosal retention cysts without paranasal sinus air-fluid levels. The mastoid air cells are well  aerated. No suspicious calvarial bone marrow signal. No abnormal sellar expansion. Craniocervical junction maintained.  MRV head: Normal flow related enhancement of superior sagittal sinus and torcula of oral Foley. The RIGHT transverse sinus is dominant, with flow voids enhancement of the sigmoid and internal jugular veins.  Normal flow related enhancement within the internal cerebral veins though, not tailored for evaluation.  IMPRESSION: Normal noncontrast MRI of the head. However, findings of intracranial hypotension better appreciated on contrast-enhanced imaging.  No dural venous sinus thrombosis though, contrast MRV may be more sensitive.   Electronically Signed   By: Awilda Metro   On: 06/04/2014 01:35   Mr Brain W Contrast  06/04/2014   CLINICAL DATA:  Recently diagnosed with [facility] spotted fever. Status post lumbar puncture x2 today with severe headache.  EXAM: MRI HEAD WITH CONTRAST  TECHNIQUE: Multiplanar, multiecho pulse sequences of the brain and surrounding structures were obtained with intravenous contrast.  COMPARISON:  None.  CONTRAST:  18mL MULTIHANCE GADOBENATE DIMEGLUMINE 529 MG/ML IV SOLN  FINDINGS: No abnormal calvarial or extracranial enhancement. Nonenhancing mucosal disease in the right maxillary sinus is consistent with mucous retention cysts.  Mild dural thickening, best visualized on coronal imaging, which is likely a manifestation of recent lumbar punctures. No brain sagging, subdural effusion, or herniation. There is no abnormal parenchymal enhancement suggestive of a demyelinating disease or cerebritis. No abnormal cranial nerve enhancement. 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  IMPRESSION: 1. No findings related to history of [facility] spotted fever. 2. Minimal dural thickening, likely related to recent lumbar punctures. No brain sag or other evidence of significant intracranial hypotension.    Electronically Signed   By: Marnee Spring M.D.   On: 06/04/2014 03:40   Mr Mrv Head Wo Cm  06/04/2014   CLINICAL DATA:  Recently diagnosed with [facility] spotted fever, status post lumbar puncture 2 days ago, subsequent positional 10/10 headache, photophobia and phonophobia.  EXAM: MRI HEAD WITHOUT CONTRAST  MRV HEAD WITHOUT CONTRAST  TECHNIQUE: Multiplanar, multiecho pulse sequences of the brain and surrounding structures were obtained without intravenous contrast. Angiographic images of the intracranial venous structures were obtained using MRV technique without intravenous contrast.  COMPARISON:  None.  FINDINGS: MRI head: The ventricles and sulci are normal for patient's age. No abnormal parenchymal signal, mass lesions, mass effect. No reduced diffusion to suggest acute ischemia. No susceptibility artifact to suggest hemorrhage.  No abnormal extra-axial fluid collections. No extra-axial masses though, contrast enhanced sequences would be more sensitive. Normal major intracranial vascular flow voids seen at the skull base.  Ocular globes and orbital contents are unremarkable though not tailored for evaluation. No abnormal sellar expansion. RIGHT maxillary mucosal retention cysts without paranasal sinus air-fluid levels. The mastoid air cells are well aerated. No suspicious calvarial bone marrow signal. No abnormal sellar expansion. Craniocervical junction maintained.  MRV head: Normal flow related enhancement of superior sagittal sinus and torcula of oral Foley. The RIGHT transverse sinus is dominant, with flow voids enhancement of the sigmoid and internal jugular veins.  Normal flow related enhancement within the internal cerebral veins though, not tailored  for evaluation.  IMPRESSION: Normal noncontrast MRI of the head. However, findings of intracranial hypotension better appreciated on contrast-enhanced imaging.  No dural venous sinus thrombosis though, contrast MRV may be more sensitive.    Electronically Signed   By: Awilda Metro   On: 06/04/2014 01:35     PERTINENT LAB RESULTS: CBC:  Recent Labs  06/03/14 1355 06/04/14 0422  WBC 7.4 11.0*  HGB 12.3 11.8*  HCT 37.3 35.5*  PLT 131* 149*   CMET CMP     Component Value Date/Time   NA 138 06/04/2014 0422   K 4.4 06/04/2014 0422   CL 107 06/04/2014 0422   CO2 21 06/04/2014 0422   GLUCOSE 110* 06/04/2014 0422   BUN 11 06/04/2014 0422   CREATININE 0.83 06/04/2014 0422   CALCIUM 8.7 06/04/2014 0422   PROT 5.7* 06/04/2014 0422   ALBUMIN 3.0* 06/04/2014 0422   AST 184* 06/04/2014 0422   ALT 206* 06/04/2014 0422   ALKPHOS 96 06/04/2014 0422   BILITOT 0.8 06/04/2014 0422   GFRNONAA 88* 06/04/2014 0422   GFRAA >90 06/04/2014 0422    GFR Estimated Creatinine Clearance: 105 mL/min (by C-G formula based on Cr of 0.83). No results for input(s): LIPASE, AMYLASE in the last 72 hours. No results for input(s): CKTOTAL, CKMB, CKMBINDEX, TROPONINI in the last 72 hours. Invalid input(s): POCBNP No results for input(s): DDIMER in the last 72 hours. No results for input(s): HGBA1C in the last 72 hours. No results for input(s): CHOL, HDL, LDLCALC, TRIG, CHOLHDL, LDLDIRECT in the last 72 hours. No results for input(s): TSH, T4TOTAL, T3FREE, THYROIDAB in the last 72 hours.  Invalid input(s): FREET3 No results for input(s): VITAMINB12, FOLATE, FERRITIN, TIBC, IRON, RETICCTPCT in the last 72 hours. Coags:  Recent Labs  06/03/14 1355  INR 1.03   Microbiology: Recent Results (from the past 240 hour(s))  Culture, blood (routine x 2)     Status: None (Preliminary result)   Collection Time: 06/01/14 11:55 AM  Result Value Ref Range Status   Specimen Description BLOOD LEFT ANTECUBITAL  Final   Special Requests BOTTLES DRAWN AEROBIC AND ANAEROBIC 10CC  Final   Culture   Final           BLOOD CULTURE RECEIVED NO GROWTH TO DATE CULTURE WILL BE HELD FOR 5 DAYS BEFORE ISSUING A FINAL NEGATIVE REPORT Performed at [facility]    Report Status PENDING  Incomplete  Culture, blood (routine x 2)     Status: None (Preliminary result)   Collection Time: 06/01/14 12:00 PM  Result Value Ref Range Status   Specimen Description BLOOD RIGHT ARM  Final   Special Requests BOTTLES DRAWN AEROBIC AND ANAEROBIC 10 CC  Final   Culture   Final           BLOOD CULTURE RECEIVED NO GROWTH TO DATE CULTURE WILL BE HELD FOR 5 DAYS BEFORE ISSUING A FINAL NEGATIVE REPORT Performed at [facility]    Report Status PENDING  Incomplete  CSF culture     Status: None (Preliminary result)   Collection Time: 06/01/14  1:11 PM  Result Value Ref Range Status   Specimen Description CSF BACK  Final   Special Requests NONE  Final   Gram Stain   Final    CYTOSPIN NO WBC SEEN NO ORGANISMS SEEN Performed at [facility] Performed at [facility]    Culture   Final    NO GROWTH 3 DAYS Performed at [facility]    Report Status PENDING  Incomplete  Gram stain     Status: None   Collection Time: 06/01/14  1:11 PM  Result Value Ref Range Status   Specimen Description CSF BACK  Final   Special Requests NONE  Final   Gram Stain NO WBC SEEN NO ORGANISMS SEEN CYTOSPIN SLIDE  Final   Report Status 06/01/2014 FINAL  Final     BRIEF HOSPITAL COURSE:   Active Problems:   Headache: Thought to be secondary to a post lumbar puncture headache. Alternatively could have lingering headache from RMSF as well. Patient was admitted and neurology was consulted. Subsequently underwent negative MRI brain and negative MRV brain. Neurology subsequently recommended a blood patch placement, and starting patient on both Topamax and Medrol Dosepak on discharge. Neurology does not have any further recommendations, neurology is already set up a outpatient follow-up. Currently doing well, headache was minimal, stable for discharge for further workup/monitoring to be done in the outpatient setting.    [facility] spotted  fever: Continue doxycycline    Thrombocytopenia: Very minimal, continue to follow CBC in the outpatient setting    Elevated LFTs: Suspected from [facility] spotted fever, avoid hepatotoxic agents, instructed patient to make sure her PCP repeat LFTs in 1 week  TODAY-DAY OF DISCHARGE:  Subjective:   Anna Glover today has minimal headache,no chest abdominal pain,no new weakness tingling or numbness, feels much better wants to go home today.   Objective:   Blood pressure 101/53, pulse 62, temperature 98.2 F (36.8 C), temperature source Oral, resp. rate 16, height 6' (1.829 m), weight 87.091 kg (192 lb), last menstrual period 05/13/2014, SpO2 98 %. No intake or output data in the 24 hours ending 06/04/14 1531 Filed Weights   06/03/14 1225 06/03/14 1717  Weight: 87.091 kg (192 lb) 87.091 kg (192 lb)    Exam Awake Alert, Oriented *3, No new F.N deficits, Normal affect [place].AT,PERRAL Supple Neck,No JVD, No cervical lymphadenopathy appriciated.  Symmetrical Chest wall movement, Good air movement bilaterally, CTAB RRR,No Gallops,Rubs or new Murmurs, No Parasternal Heave +ve B.Sounds, Abd Soft, Non tender, No organomegaly appriciated, No rebound -guarding or rigidity. No Cyanosis, Clubbing or edema, No new Rash or bruise  DISCHARGE CONDITION: Stable  DISPOSITION: Home  DISCHARGE INSTRUCTIONS:    Activity:  As tolerated  Diet recommendation: Heart Healthy diet  Discharge Instructions    Call MD for:  persistant nausea and vomiting    Complete by:  As directed      Call MD for:  severe uncontrolled pain    Complete by:  As directed      Diet - low sodium heart healthy    Complete by:  As directed      Increase activity slowly    Complete by:  As directed            Follow-up Information    Follow up with Anson Fret, MD On 06/10/2014.   Specialty:  Neurology   Why:  appt at 3 PM   Contact information:   [address] [place] [address] [phone]        Follow up with Dolan Amen, MD. Schedule an appointment as soon as possible for a visit in 1 week.   Specialty:  Internal Medicine   Why:  Please ask your primary care M.D. to repeat LFTs and CBC in 1 week   Contact information:   [address] [place] [address] [phone]  Total Time spent on discharge equals 25  minutes.  SignedJeoffrey Massed: GHIMIRE,SHANKER 06/04/2014 3:31 PM

## 2014-06-04 NOTE — Progress Notes (Signed)
Patient removed from flat bedrest. Sitting on side of bed for 20 minutes. No complaints of headache. Some soreness in back from LP site but otherwise pain in control. IV removed with tip intact. Discharge instruction given and reviewed with patient and family. Prescriptions given to patient. Pt left with Nurse tech in wheelchair to be picked up and driven home by husband.

## 2014-06-04 NOTE — Progress Notes (Signed)
Subjective:  patient now has a HA of 3/10 and states her HA is still present and increases when she gets up and goes to the bathroom.    Objective: Current vital signs: BP 101/55 mmHg  Pulse 47  Temp(Src) 98.8 F (37.1 C) (Oral)  Resp 18  Ht 6' (1.829 m)  Wt 87.091 kg (192 lb)  BMI 26.03 kg/m2  SpO2 98%  LMP 05/13/2014 Vital signs in last 24 hours: Temp:  [97.9 F (36.6 C)-98.8 F (37.1 C)] 98.8 F (37.1 C) (04/20 0540) Pulse Rate:  [47-81] 47 (04/20 0540) Resp:  [16-20] 18 (04/20 0540) BP: (88-125)/(44-71) 101/55 mmHg (04/20 0540) SpO2:  [96 %-100 %] 98 % (04/20 0540) Weight:  [87.091 kg (192 lb)] 87.091 kg (192 lb) (04/19 1717)  Intake/Output from previous day:   Intake/Output this shift:   Nutritional status: Diet regular Room service appropriate?: Yes; Fluid consistency:: Thin  Neurologic Exam:  Mental Status: Alert, oriented, thought content appropriate.  Speech fluent without evidence of aphasia.  Able to follow 3 step commands without difficulty. Cranial Nerves: II:  Visual fields grossly normal, pupils equal, round, reactive to light and accommodation III,IV, VI: ptosis not present, extra-ocular motions intact bilaterally V,VII: smile symmetric, facial light touch sensation normal bilaterally VIII: hearing normal bilaterally IX,X: uvula rises symmetrically XI: bilateral shoulder shrug XII: midline tongue extension without atrophy or fasciculations  Motor: Right : Upper extremity   5/5    Left:     Upper extremity   5/5  Lower extremity   5/5     Lower extremity   5/5 Tone and bulk:normal tone throughout; no atrophy noted Sensory: Pinprick and light touch intact throughout, bilaterally Deep Tendon Reflexes:  Right: Upper Extremity   Left: Upper extremity   biceps (C-5 to C-6) 2/4   biceps (C-5 to C-6) 2/4 tricep (C7) 2/4    triceps (C7) 2/4 Brachioradialis (C6) 2/4  Brachioradialis (C6) 2/4  Lower Extremity Lower Extremity  quadriceps (L-2 to L-4)  2/4   quadriceps (L-2 to L-4) 2/4 Achilles (S1) 2/4   Achilles (S1) 2/4  Plantars: Right: downgoing   Left: downgoing Cerebellar: normal finger-to-nose,  normal heel-to-shin test    Lab Results: Basic Metabolic Panel:  Recent Labs Lab 06/01/14 1054 06/03/14 1355 06/04/14 0422  NA 137 139 138  K 3.7 3.8 4.4  CL 105 107 107  CO2 23 25 21   GLUCOSE 87 87 110*  BUN 11 10 11   CREATININE 0.76 0.85 0.83  CALCIUM 9.0 8.9 8.7    Liver Function Tests:  Recent Labs Lab 06/01/14 1054 06/03/14 1355 06/04/14 0422  AST 62* 206* 184*  ALT 74* 186* 206*  ALKPHOS 112 113 96  BILITOT 0.9 0.8 0.8  PROT 6.5 6.2 5.7*  ALBUMIN 3.4* 3.6 3.0*   No results for input(s): LIPASE, AMYLASE in the last 168 hours. No results for input(s): AMMONIA in the last 168 hours.  CBC:  Recent Labs Lab 06/01/14 1054 06/03/14 1355 06/04/14 0422  WBC 5.4 7.4 11.0*  NEUTROABS 1.9 3.7  --   HGB 14.2 12.3 11.8*  HCT 40.9 37.3 35.5*  MCV 90.7 92.6 91.0  PLT 108* 131* 149*    Cardiac Enzymes: No results for input(s): CKTOTAL, CKMB, CKMBINDEX, TROPONINI in the last 168 hours.  Lipid Panel: No results for input(s): CHOL, TRIG, HDL, CHOLHDL, VLDL, LDLCALC in the last 168 hours.  CBG: No results for input(s): GLUCAP in the last 168 hours.  Microbiology: Results for orders placed or performed  during the hospital encounter of 06/01/14  Culture, blood (routine x 2)     Status: None (Preliminary result)   Collection Time: 06/01/14 11:55 AM  Result Value Ref Range Status   Specimen Description BLOOD LEFT ANTECUBITAL  Final   Special Requests BOTTLES DRAWN AEROBIC AND ANAEROBIC 10CC  Final   Culture   Final           BLOOD CULTURE RECEIVED NO GROWTH TO DATE CULTURE WILL BE HELD FOR 5 DAYS BEFORE ISSUING A FINAL NEGATIVE REPORT Performed at [facility]    Report Status PENDING  Incomplete  Culture, blood (routine x 2)     Status: None (Preliminary result)   Collection Time: 06/01/14  12:00 PM  Result Value Ref Range Status   Specimen Description BLOOD RIGHT ARM  Final   Special Requests BOTTLES DRAWN AEROBIC AND ANAEROBIC 10 CC  Final   Culture   Final           BLOOD CULTURE RECEIVED NO GROWTH TO DATE CULTURE WILL BE HELD FOR 5 DAYS BEFORE ISSUING A FINAL NEGATIVE REPORT Performed at [facility]    Report Status PENDING  Incomplete  CSF culture     Status: None (Preliminary result)   Collection Time: 06/01/14  1:11 PM  Result Value Ref Range Status   Specimen Description CSF BACK  Final   Special Requests NONE  Final   Gram Stain   Final    CYTOSPIN NO WBC SEEN NO ORGANISMS SEEN [facility] Performed at [facility]    Culture   Final    NO GROWTH 2 DAYS Performed at [facility]    Report Status PENDING  Incomplete  Gram stain     Status: None   Collection Time: 06/01/14  1:11 PM  Result Value Ref Range Status   Specimen Description CSF BACK  Final   Special Requests NONE  Final   Gram Stain NO WBC SEEN NO ORGANISMS SEEN CYTOSPIN SLIDE  Final   Report Status 06/01/2014 FINAL  Final    Coagulation Studies:  Recent Labs  06/03/14 1355  LABPROT 13.6  INR 1.03    Imaging: Mr Brain Wo Contrast  06/04/2014   CLINICAL DATA:  [facility], status post lumbar puncture 2 days ago, subsequent positional 10/10 headache, photophobia and phonophobia.  EXAM: MRI HEAD WITHOUT CONTRAST  MRV HEAD WITHOUT CONTRAST  TECHNIQUE: Multiplanar, multiecho pulse sequences of the brain and surrounding structures were obtained without intravenous contrast. Angiographic images of the intracranial venous structures were obtained using MRV technique without intravenous contrast.  COMPARISON:  None.  FINDINGS: MRI head: The ventricles and sulci are normal for patient's age. No abnormal parenchymal signal, mass lesions, mass effect. No reduced diffusion to suggest acute ischemia. No susceptibility  artifact to suggest hemorrhage.  No abnormal extra-axial fluid collections. No extra-axial masses though, contrast enhanced sequences would be more sensitive. Normal major intracranial vascular flow voids seen at the skull base.  Ocular globes and orbital contents are unremarkable though not tailored for evaluation. No abnormal sellar expansion. RIGHT maxillary mucosal retention cysts without paranasal sinus air-fluid levels. The mastoid air cells are well aerated. No suspicious calvarial bone marrow signal. No abnormal sellar expansion. Craniocervical junction maintained.  MRV head: Normal flow related enhancement of superior sagittal sinus and torcula of oral Foley. The RIGHT transverse sinus is dominant, with flow voids enhancement of the sigmoid and internal jugular veins.  Normal flow related enhancement within the internal cerebral veins though, not tailored for evaluation.  IMPRESSION: Normal noncontrast MRI of the head. However, findings of intracranial hypotension better appreciated on contrast-enhanced imaging.  No dural venous sinus thrombosis though, contrast MRV may be more sensitive.   Electronically Signed   By: Awilda Metro   On: 06/04/2014 01:35   Mr Brain W Contrast  06/04/2014   CLINICAL DATA:  Recently diagnosed with [facility] spotted fever. Status post lumbar puncture x2 today with severe headache.  EXAM: MRI HEAD WITH CONTRAST  TECHNIQUE: Multiplanar, multiecho pulse sequences of the brain and surrounding structures were obtained with intravenous contrast.  COMPARISON:  None.  CONTRAST:  18mL MULTIHANCE GADOBENATE DIMEGLUMINE 529 MG/ML IV SOLN  FINDINGS: No abnormal calvarial or extracranial enhancement. Nonenhancing mucosal disease in the right maxillary sinus is consistent with mucous retention cysts.  Mild dural thickening, best visualized on coronal imaging, which is likely a manifestation of recent lumbar punctures. No brain sagging, subdural effusion, or herniation. There is  no abnormal parenchymal enhancement suggestive of a demyelinating disease or cerebritis. No abnormal cranial nerve enhancement. 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  IMPRESSION: 1. No findings related to history of [facility] spotted fever. 2. Minimal dural thickening, likely related to recent lumbar punctures. No brain sag or other evidence of significant intracranial hypotension.   Electronically Signed   By: Marnee Spring M.D.   On: 06/04/2014 03:40   Mr Mrv Head Wo Cm  06/04/2014   CLINICAL DATA:  Recently diagnosed with [facility] spotted fever, status post lumbar puncture 2 days ago, subsequent positional 10/10 headache, photophobia and phonophobia.  EXAM: MRI HEAD WITHOUT CONTRAST  MRV HEAD WITHOUT CONTRAST  TECHNIQUE: Multiplanar, multiecho pulse sequences of the brain and surrounding structures were obtained without intravenous contrast. Angiographic images of the intracranial venous structures were obtained using MRV technique without intravenous contrast.  COMPARISON:  None.  FINDINGS: MRI head: The ventricles and sulci are normal for patient's age. No abnormal parenchymal signal, mass lesions, mass effect. No reduced diffusion to suggest acute ischemia. No susceptibility artifact to suggest hemorrhage.  No abnormal extra-axial fluid collections. No extra-axial masses though, contrast enhanced sequences would be more sensitive. Normal major intracranial vascular flow voids seen at the skull base.  Ocular globes and orbital contents are unremarkable though not tailored for evaluation. No abnormal sellar expansion. RIGHT maxillary mucosal retention cysts without paranasal sinus air-fluid levels. The mastoid air cells are well aerated. No suspicious calvarial bone marrow signal. No abnormal sellar expansion. Craniocervical junction maintained.  MRV head: Normal flow related enhancement of superior sagittal sinus and torcula of oral Foley.  The RIGHT transverse sinus is dominant, with flow voids enhancement of the sigmoid and internal jugular veins.  Normal flow related enhancement within the internal cerebral veins though, not tailored for evaluation.  IMPRESSION: Normal noncontrast MRI of the head. However, findings of intracranial hypotension better appreciated on contrast-enhanced imaging.  No dural venous sinus thrombosis though, contrast MRV may be more sensitive.   Electronically Signed   By: Awilda Metro   On: 06/04/2014 01:35    Medications:  Scheduled: . diphenhydrAMINE  25 mg Intravenous 3 times per day  . doxycycline  100 mg Oral BID  . ketorolac  30 mg Intravenous 3 times per day  . levothyroxine  137 mcg Oral Daily  . loratadine  10 mg Oral Daily  . metoCLOPramide (REGLAN) injection  10  mg Intravenous 3 times per day  . sertraline  50 mg Oral QHS  . sodium chloride  3 mL Intravenous Q12H  . topiramate  25 mg Oral QHS    Assessment/Plan:  39 YO female with recently diagnosed RMSF presenting with HA which has worsened S/P LP. Exam is non focal and MRI and MRV is negative. Likely related to RMSF but cannot rule out post LP HA versus migraine. Patient seems to improve with migraine cocktail but continues to have HA when standing.  Radiology consulted and they will perform a blood patch today. Will also start her on Topamax 25 mg QHS. If no better after Blood patch will treat symptomatically and have follow up with Dr Lucia Gaskins at [facility] on April 26 at [facility].    Felicie Morn PA-C [facility] [phone]  06/04/2014, 9:41 AM   Patient seen and evaluated. Agree with above assessment and plan. Patient will be scheduled for blood patch today for possible low pressure headache. Scheduled with neurology follow up on April 26 with Dr Lucia Gaskins.

## 2014-06-04 NOTE — Progress Notes (Addendum)
RN notified by radiology staff member that Radiologist needed a new order for MRI with contrast. On-call NP paged. Verbal orders given for RN to put in new order for MRI with contrast. Monia PouchShakenna Avalyn Molino, RN

## 2014-06-05 LAB — CSF CULTURE
Culture: NO GROWTH
GRAM STAIN: NONE SEEN

## 2014-06-05 LAB — CSF CULTURE W GRAM STAIN

## 2014-06-07 LAB — CULTURE, BLOOD (ROUTINE X 2)
Culture: NO GROWTH
Culture: NO GROWTH

## 2014-06-10 ENCOUNTER — Ambulatory Visit (INDEPENDENT_AMBULATORY_CARE_PROVIDER_SITE_OTHER): Payer: BC Managed Care – PPO | Admitting: Neurology

## 2014-06-10 ENCOUNTER — Encounter: Payer: Self-pay | Admitting: Neurology

## 2014-06-10 VITALS — BP 109/71 | HR 79 | Temp 98.2°F | Ht 72.0 in | Wt 189.0 lb

## 2014-06-10 DIAGNOSIS — G441 Vascular headache, not elsewhere classified: Secondary | ICD-10-CM

## 2014-06-10 DIAGNOSIS — G971 Other reaction to spinal and lumbar puncture: Secondary | ICD-10-CM

## 2014-06-10 MED ORDER — METHYLPREDNISOLONE 4 MG PO TBPK: ORAL_TABLET | ORAL | Status: DC

## 2014-06-10 NOTE — Progress Notes (Signed)
[facility]    Provider:  Dr Lucia Gaskins Referring Provider: No ref. provider found Primary Care Physician:  Dolan Amen, MD  CC:  headache  HPI:  Anna Glover is a 39 y.o. female here as a referral from Dr. No ref. provider found for headache  2 weeks ago Found a tick on her, not engorged, maybe was there for 1-2 hours max. Tweezed it off. Started having a headache 10 days later and stiff neck with fever. No rash. Headache worse when standing. Thought it was a sinus infection. Headache was pressure, severe like someone taking a sledge hammer and smashing her head. Fever 104 degrees. Temp usually 96.6. Was put on doxycycline. Felt like she was going to die. On the 17th she was in a dark room, couldn't eat, febrile, started on Abx on 15th. Went to ED 17th had an LP. Headache became profoundly worse. 19th had an MRI, had a blood patch. She has been headache free since then. She had a prednisone taper and she felt better. She has a stiff neck today. She has been dizzy today, some stiff neck. She bent down and stood up and it was more pronounced. She drinks enough fluids.   Reviewed notes, labs and imaging from outside physicians, which showed: CSF protein, glucose, cell count and diff wnl. CMP showed elevated AST/ALT 206/186 from a normal baseline. Patient was diagnosed with [facility] spotted fever by her primary care physician. She was placed on doxycycline on April 16. She has had headaches since April 14. On April 17 headache worsened and she presented to the emergency room with headache and concern for meningitis. Lumbar puncture was negative. Patient developed a lumbar puncture headache, which improved with blood patch. Patient at the time reported no fevers since antibiotics started. In the emergency room her upper function tests were elevated and headache did not improve with Dilaudid Benadryl, steroids, magnesium, Compazine. Neurology was consulted for 10 out of 10  headache with photophobia and phonophobia. Headache was positional after lumbar puncture. No numbness, tingling, weakness, speech abnormalities were noted in the emergency room. MRI and MRV were negative. Diagnosis was likely related to [facility] spotted fever but could not rule out post-LP headache versus migraine. Patient improved with migraine cocktail. Patient was also started on Topamax 25 mg.  MRI/MRV: MRi brain IMPRESSION: 1. No findings related to history of [facility] spotted fever. 2. Minimal dural thickening, likely related to recent lumbar punctures. No brain sag or other evidence of significant intracranial hypotension.  MRV head: IMPRESSION: Normal noncontrast MRI of the head. However, findings of intracranial hypotension better appreciated on contrast-enhanced imaging.    Review of Systems: Patient complains of symptoms per HPI as well as the following symptoms: no CP. No SOB. Pertinent negatives per HPI. All others negative.   History   Social History  . Marital Status: Married    Spouse Name: Harriett Sine   . Number of Children: 2  . Years of Education: Masters    Occupational History  . Kindergarten Teacher    Social History Main Topics  . Smoking status: Former Smoker    Quit date: 07/10/2009  . Smokeless tobacco: Not on file  . Alcohol Use: 0.6 oz/week    1 Glasses of wine per week     Comment: occ: 1-2 glasses wine every other day   . Drug Use: No  . Sexual Activity: Not on file   Other Topics Concern  . Not on file   Social  History Narrative   Lives at home with husband   Caffeine use: Drinks coffee every day        Family History  Problem Relation Age of Onset  . Hypertension Mother   . Hyperlipidemia Mother   . Hypertension Father   . Heart disease Father   . Pulmonary fibrosis Father     Past Medical History  Diagnosis Date  . Thyroid disease   . Depression   . Anxiety   . Hypothyroidism     Past Surgical History    Procedure Laterality Date  . Cesarean section  02/25/2003  . Cesarean section  05/01/2009  . Cholecystectomy  June 2014    Current Outpatient Prescriptions  Medication Sig Dispense Refill  . DiphenhydrAMINE HCl (BENADRYL ALLERGY PO) Take 2 tablets by mouth at bedtime.    Marland Kitchen. levothyroxine (SYNTHROID, LEVOTHROID) 137 MCG tablet Take 137 mcg by mouth daily.    . Loratadine (CLARITIN) 10 MG CAPS Take 10 mg by mouth daily.     . sertraline (ZOLOFT) 50 MG tablet Take 50 mg by mouth daily.    . butalbital-acetaminophen-caffeine (FIORICET) 50-325-40 MG per tablet Take 1 tablet by mouth every 6 (six) hours as needed for headache. (Patient not taking: Reported on 06/03/2014) 20 tablet 0  . celecoxib (CELEBREX) 200 MG capsule Take 200 mg by mouth as needed.  2  . doxycycline (VIBRA-TABS) 100 MG tablet Take 100 mg by mouth 2 (two) times daily.    Marland Kitchen. ibuprofen (ADVIL,MOTRIN) 200 MG tablet Take 200 mg by mouth every 6 (six) hours as needed for mild pain.    . methylPREDNISolone (MEDROL DOSEPAK) 4 MG TBPK tablet Use as instructed (Patient not taking: Reported on 06/10/2014) 21 tablet 0  . methylPREDNISolone (MEDROL DOSEPAK) 4 MG TBPK tablet Take all together in the morning daily each day 21 tablet 1  . oxyCODONE (ROXICODONE) 5 MG immediate release tablet Take 1 tablet (5 mg total) by mouth every 6 (six) hours as needed for severe pain. (Patient not taking: Reported on 06/10/2014) 20 tablet 0   No current facility-administered medications for this visit.    Allergies as of 06/10/2014 - Review Complete 06/10/2014  Allergen Reaction Noted  . Cefdinir Other (See Comments) 07/10/2013  . Erythromycin Nausea And Vomiting 01/17/2012  . Penicillins Hives 01/17/2012  . Sulfur Other (See Comments) 07/10/2013    Vitals: BP 109/71 mmHg  Pulse 79  Temp(Src) 98.2 F (36.8 C)  Ht 6' (1.829 m)  Wt 189 lb (85.73 kg)  BMI 25.63 kg/m2  LMP 05/13/2014 Last Weight:  Wt Readings from Last 1 Encounters:  06/10/14 189  lb (85.73 kg)   Last Height:   Ht Readings from Last 1 Encounters:  06/10/14 6' (1.829 m)    Physical exam: Exam: Gen: NAD, conversant, well nourised,  well groomed                     CV: RRR, no MRG. No Carotid Bruits. No peripheral edema, warm, nontender Eyes: Conjunctivae clear without exudates or hemorrhage  Neuro: Detailed Neurologic Exam  Speech:    Speech is normal; fluent and spontaneous with normal comprehension.  Cognition:    The patient is oriented to person, place, and time;     recent and remote memory intact;     language fluent;     normal attention, concentration,     fund of knowledge Cranial Nerves:    The pupils are equal, round, and reactive to light. The  fundi are normal and spontaneous venous pulsations are present. Visual fields are full to finger confrontation. Extraocular movements are intact. Trigeminal sensation is intact and the muscles of mastication are normal. The face is symmetric. The palate elevates in the midline. Hearing intact. Voice is normal. Shoulder shrug is normal. The tongue has normal motion without fasciculations.   Coordination:    Normal finger to nose and heel to shin. Normal rapid alternating movements.   Gait:    Heel-toe and tandem gait are normal.   Motor Observation:    No asymmetry, no atrophy, and no involuntary movements noted. Tone:    Normal muscle tone.    Posture:    Posture is normal. normal erect    Strength:    Strength is V/V in the upper and lower limbs.      Sensation: intact to LT     Reflex Exam:  DTR's:    Deep tendon reflexes in the upper and lower extremities are normal bilaterally.   Toes:    The toes are downgoing bilaterally.   Clonus:    Clonus is absent.      Assessment/Plan:  39 year old patient with headache, photophobia, phonophobia after being diagnosed with [facility] spotted fever and having a lumbar puncture performed. Unclear if headache related to suppose it [facility] spotted fever or possibly post lumbar puncture or migrainous. MRI MRV of the head unremarkable except for intracranial hypotension. Lumbar puncture CSF labs were within normal limits. Patient does not have a previous history of headaches. Patient feeling better on a Medrol dose pack but headaches returning now that the dose pack is in [place], can repeat Medrol Dosepak. She just needs a longer taper. Unclear if Topamax in the evenings is helping considering patient has no previous history of headaches or migraines. We'll try one more taper Medrol Dosepak.  Naomie Dean, MD  [address] [address] [place], [place] [address]  Phone [phone] Fax [phone]

## 2014-06-15 ENCOUNTER — Encounter: Payer: Self-pay | Admitting: Neurology

## 2014-06-29 ENCOUNTER — Other Ambulatory Visit: Payer: Self-pay | Admitting: Internal Medicine

## 2015-04-02 MED FILL — OSELTAMIVIR PHOS 75 MG CAP: 75 | 5 days supply | Qty: 10 | Fill #0

## 2016-03-07 ENCOUNTER — Encounter (INDEPENDENT_AMBULATORY_CARE_PROVIDER_SITE_OTHER): Payer: Self-pay | Admitting: Orthopaedic Surgery

## 2016-03-07 ENCOUNTER — Ambulatory Visit (INDEPENDENT_AMBULATORY_CARE_PROVIDER_SITE_OTHER): Payer: Self-pay

## 2016-03-07 ENCOUNTER — Ambulatory Visit (INDEPENDENT_AMBULATORY_CARE_PROVIDER_SITE_OTHER): Payer: BC Managed Care – PPO | Admitting: Orthopaedic Surgery

## 2016-03-07 DIAGNOSIS — M25562 Pain in left knee: Secondary | ICD-10-CM | POA: Diagnosis not present

## 2016-03-07 DIAGNOSIS — G8929 Other chronic pain: Secondary | ICD-10-CM | POA: Diagnosis not present

## 2016-03-07 DIAGNOSIS — M25561 Pain in right knee: Secondary | ICD-10-CM

## 2016-03-07 MED ORDER — LIDOCAINE HCL 1 % IJ SOLN
2.0000 mL | INTRAMUSCULAR | Status: AC | PRN
  Administered 2016-03-07: 2 mL

## 2016-03-07 MED ORDER — METHYLPREDNISOLONE ACETATE 40 MG/ML IJ SUSP
40.0000 mg | INTRAMUSCULAR | Status: AC | PRN
  Administered 2016-03-07: 40 mg via INTRA_ARTICULAR

## 2016-03-07 MED ORDER — BUPIVACAINE HCL 0.5 % IJ SOLN
2.0000 mL | INTRAMUSCULAR | Status: AC | PRN
  Administered 2016-03-07: 2 mL via INTRA_ARTICULAR

## 2016-03-07 MED ORDER — CELECOXIB 200 MG PO CAPS: 200.0000 mg | ORAL_CAPSULE | ORAL | 2 refills | Status: DC | PRN

## 2016-03-07 NOTE — Progress Notes (Signed)
Office Visit Note   Patient: Anna Glover           Date of Birth: 02/26/1975           MRN: [id number] Visit Date: 03/07/2016              Requested by: Dolan Amenorothy Norwood, MD [address] [place], [place] [id number] PCP: Dolan AmenNORWOOD, DOROTHY, MD   Assessment & Plan: Visit Diagnoses:  1. Chronic pain of both knees     Plan: Impression is exacerbation of bilateral patellofemoral chondromalacia. Patient understands that activity will exacerbate the condition specialty long-distance running. Bilateral knees were injected today. Celebrex refill. Follow-up as needed.  Follow-Up Instructions: Return if symptoms worsen or fail to improve.   Orders:  Orders Placed This Encounter  Procedures  . XR KNEE 3 VIEW LEFT  . XR KNEE 3 VIEW RIGHT  . Ambulatory referral to Physical Therapy   Meds ordered this encounter  Medications  . celecoxib (CELEBREX) 200 MG capsule    Sig: Take 1 capsule (200 mg total) by mouth as needed.    Dispense:  60 capsule    Refill:  2      Procedures: Large Joint Inj Date/Time: 03/07/2016 9:43 PM Performed by: Tarry KosXU, Charde Macfarlane M Authorized by: Tarry KosXU, Niana Martorana M   Consent Given by:  Patient Timeout: prior to procedure the correct patient, procedure, and site was verified   Indications:  Pain Location:  Knee Site:  R knee Prep: patient was prepped and draped in usual sterile fashion   Needle Size:  22 G Ultrasound Guidance: No   Fluoroscopic Guidance: No   Arthrogram: No   Patient tolerance:  Patient tolerated the procedure well with no immediate complications Large Joint Inj Date/Time: 03/07/2016 9:43 PM Performed by: Tarry KosXU, Ziaire Bieser M Authorized by: Tarry KosXU, Kassady Laboy M   Consent Given by:  Patient Timeout: prior to procedure the correct patient, procedure, and site was verified   Indications:  Pain Location:  Knee Site:  R knee Prep: patient was prepped and draped in usual sterile fashion   Needle Size:  22 G Ultrasound Guidance: No   Fluoroscopic Guidance: No    Arthrogram: No   Medications:  2 mL lidocaine 1 %; 2 mL bupivacaine 0.5 %; 40 mg methylPREDNISolone acetate 40 MG/ML Patient tolerance:  Patient tolerated the procedure well with no immediate complications     Clinical Data: No additional findings.   Subjective: Chief Complaint  Patient presents with  . Right Knee - Pain  . Left Knee - Pain    Patient comes back today for bilateral knee pain related to patellofemoral chondromalacia. Patient is getting worse with increased activity. She is currently training for marathon. Her last injections helped significantly. She will like another injection.    Review of Systems   Objective: Vital Signs: There were no vitals taken for this visit.  Physical Exam  Ortho Exam Exam of bilateral knee shows no joint effusion. Good range of motion. Specialty Comments:  No specialty comments available.  Imaging: No results found.   PMFS History: Patient Active Problem List   Diagnosis Date Noted  . Headache 06/03/2014  . 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 Thrombocytopenia (HCC) 06/03/2014  . Elevated LFTs 06/03/2014   Past Medical History:  Diagnosis Date  . Anxiety   . Depression   . Hypothyroidism   . Thyroid disease     Family History  Problem Relation Age of Onset  . Hypertension Mother   .  Hyperlipidemia Mother   . Hypertension Father   . Heart disease Father   . Pulmonary fibrosis Father   . Migraines Neg Hx     Past Surgical History:  Procedure Laterality Date  . CESAREAN SECTION  02/25/2003  . CESAREAN SECTION  05/01/2009  . CHOLECYSTECTOMY  June 2014   Social History   Occupational History  . Kindergarten Teacher    Social History Main Topics  . Smoking status: Former Smoker    Quit date: 07/10/2009  . Smokeless tobacco: Never Used  . Alcohol use 0.6 oz/week    1 Glasses of wine per week     Comment: occ: 1-2 glasses wine every other day   . Drug use: No  . Sexual activity: Not on file

## 2016-04-06 ENCOUNTER — Encounter (INDEPENDENT_AMBULATORY_CARE_PROVIDER_SITE_OTHER): Payer: Self-pay | Admitting: *Deleted

## 2016-05-10 IMAGING — MR MR MRV HEAD W/O CM
10 of 13 series · 27 of 48 positions shown · non-contrast
Comparison: None.

CLINICAL DATA: Recently diagnosed with Bryan Jesus Tano
fever, status post lumbar puncture 2 days ago, subsequent positional
[DATE] headache, photophobia and phonophobia.

EXAM:
MRI HEAD WITHOUT CONTRAST
MRV HEAD WITHOUT CONTRAST
TECHNIQUE: Multiplanar, multiecho pulse sequences of the brain and surrounding
structures were obtained without intravenous contrast. Angiographic
images of the intracranial venous structures were obtained using MRV
technique without intravenous contrast.

[Series 2: FLAIR · sagittal · 5.0mm · 0.47mm/px · 1 of 23 slices shown (1 of 2)]
[im 1/23]
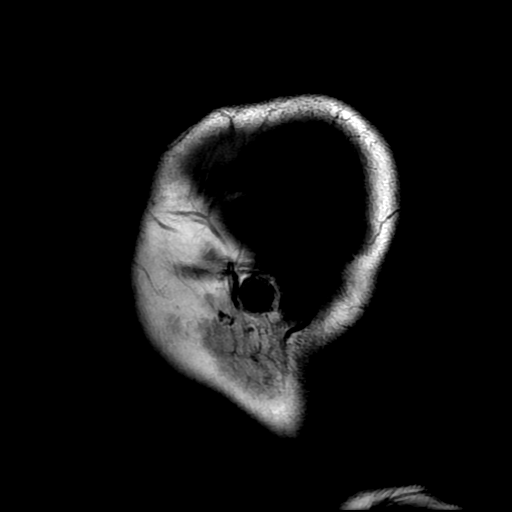

[Series 4: DWI · axial · 3.0mm · 0.94mm/px · z∈[-68,+68]mm · 5 of 94 slices shown (1 of 4)]
[im 1/94]
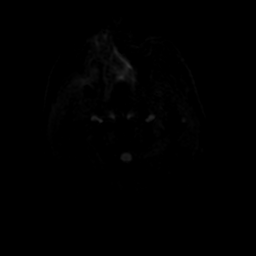
[im 24/94]
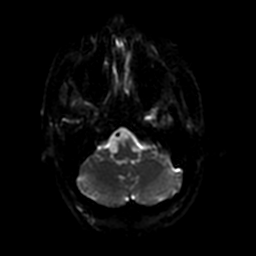
[im 47/94]
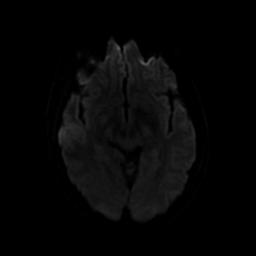
[im 70/94]
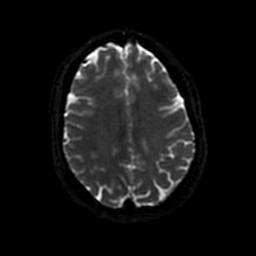
[im 94/94]
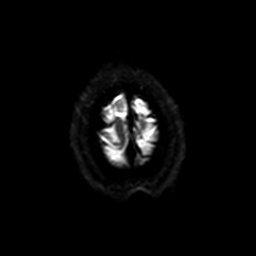

[Series 5: MRV · coronal · 1.5mm · 0.43mm/px · 7 of 146 slices shown]
[im 1/146]
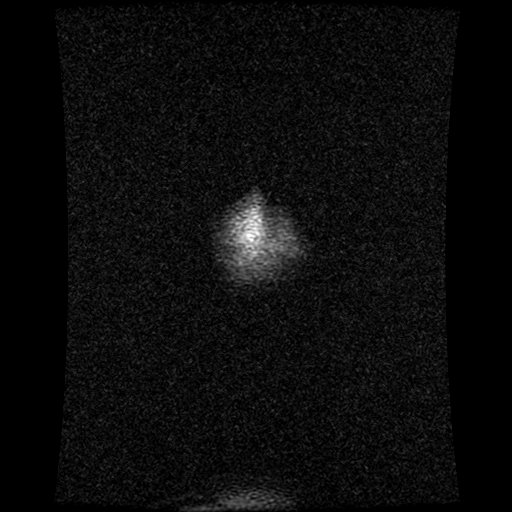
[im 25/146]
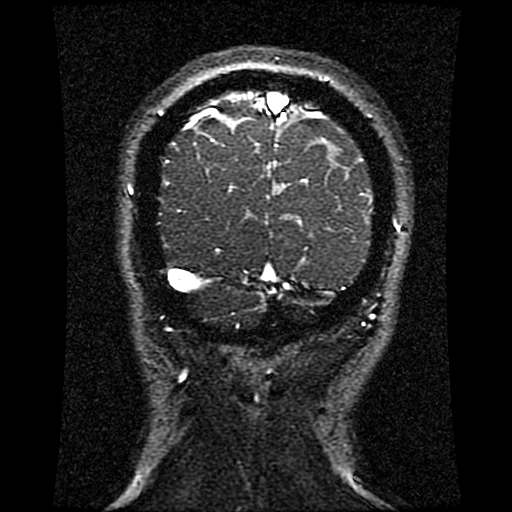
[im 49/146]
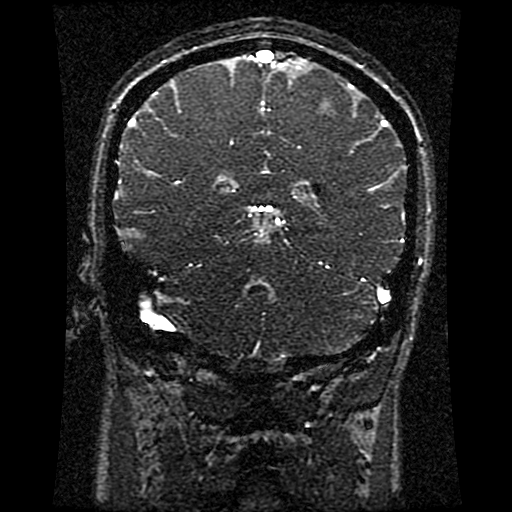
[im 73/146]
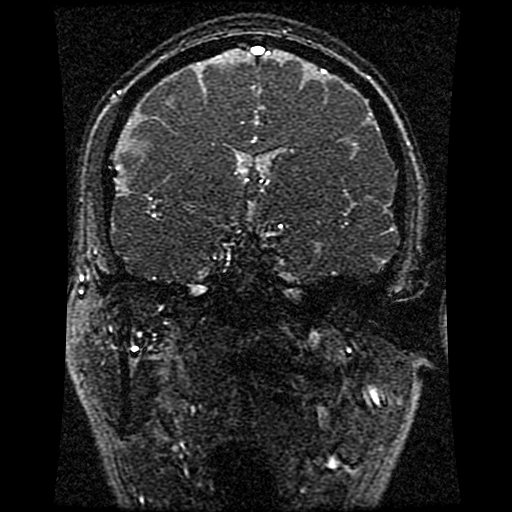
[im 97/146]
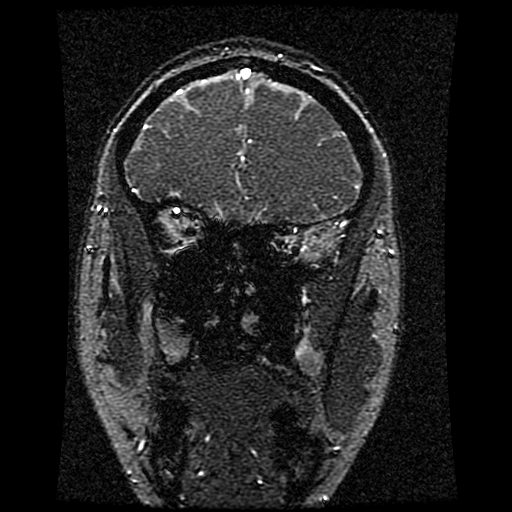
[im 121/146]
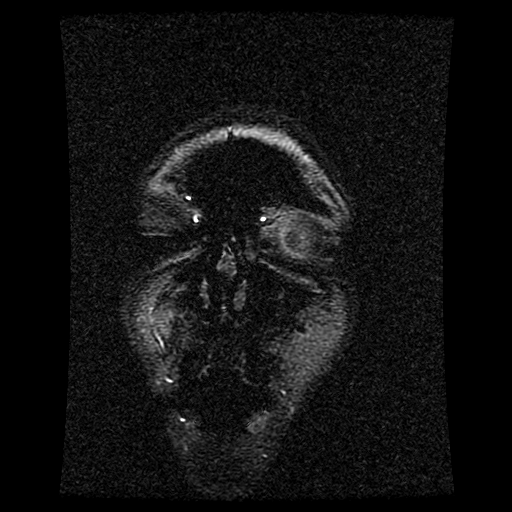
[im 146/146]
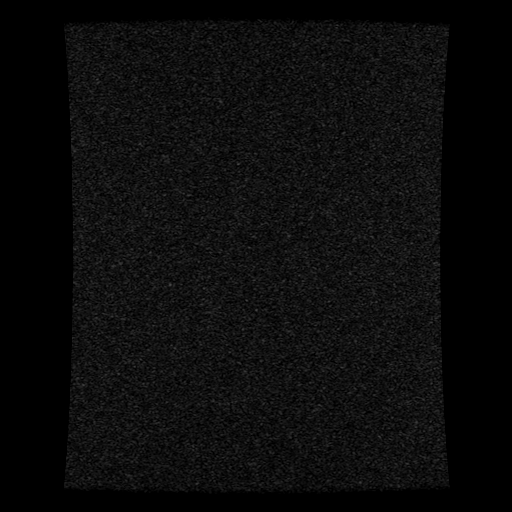

[Series 6: T2 · axial · 5.0mm · 0.47mm/px · 1 of 24 slices shown (1 of 2)]
[im 1/24]
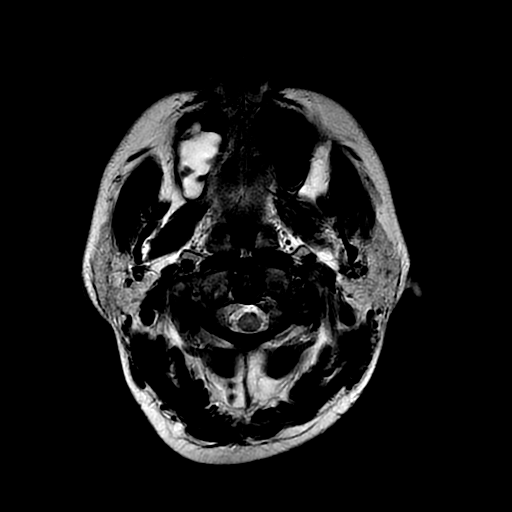

[Series 7: FLAIR · axial · 5.0mm · 0.47mm/px · 1 of 24 slices shown (2 of 2)]
[im 1/24]
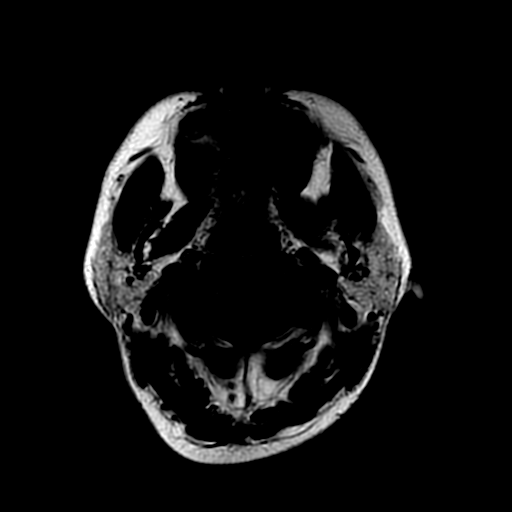

[Series 8: DWI · coronal · 5.0mm · 0.94mm/px · 3 of 66 slices shown (2 of 4)]
[im 1/66]
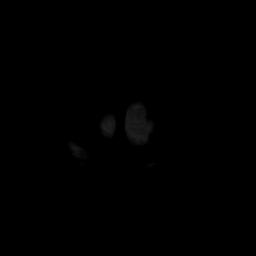
[im 33/66]
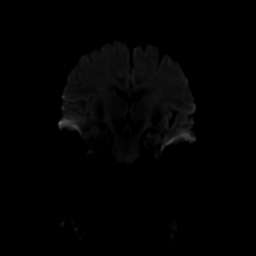
[im 66/66]
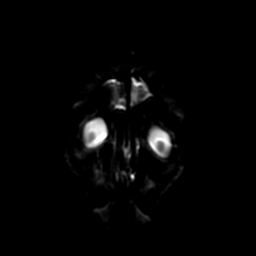

[Series 9: sag inhance (id) · sagittal · 1.8mm · 0.47mm/px · 4 of 295 slices shown]
[im 1/295]
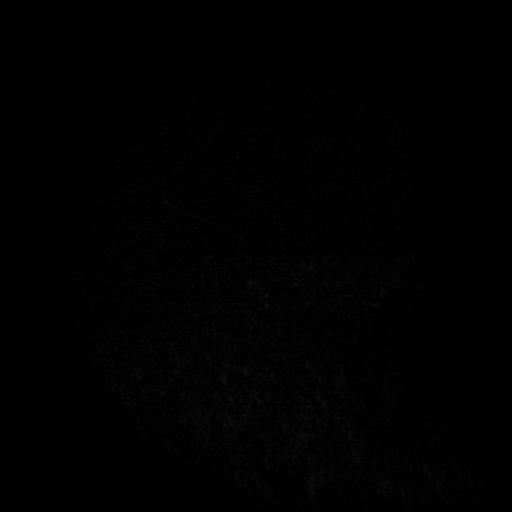
[im 43/295]
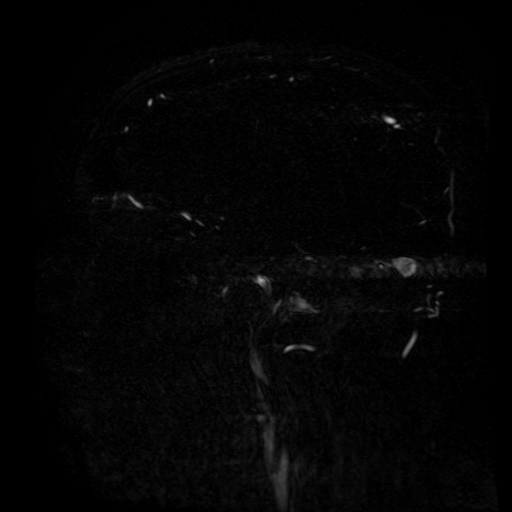
[im 85/295]
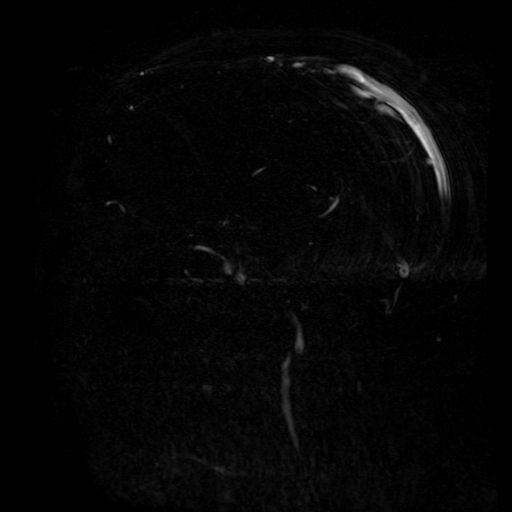
[im 127/295]
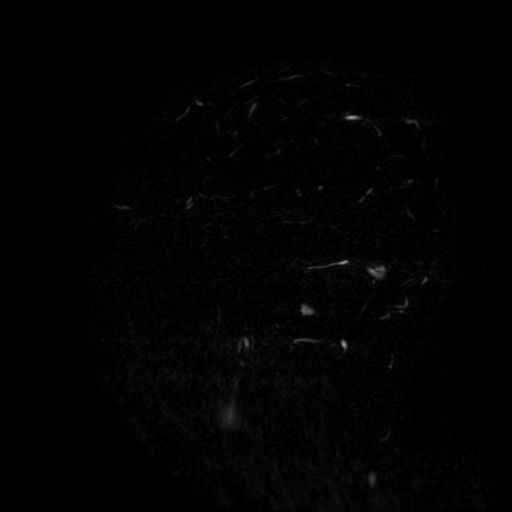

[Series 12: T2 · coronal · 5.0mm · 0.47mm/px · 1 of 28 slices shown (2 of 2)]
[im 1/28]
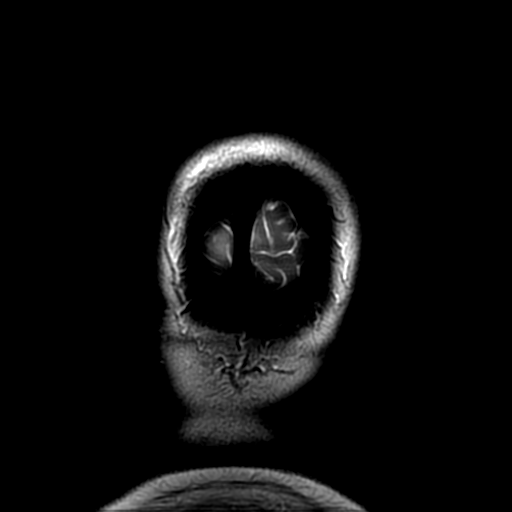

[Series 400: DWI · axial · 3.0mm · 0.94mm/px · z∈[-68,+68]mm · 2 of 47 slices shown (3 of 4)]
[im 1/47]
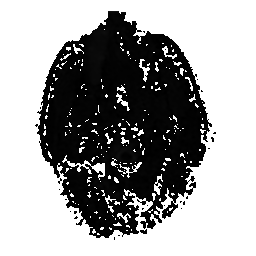
[im 47/47]
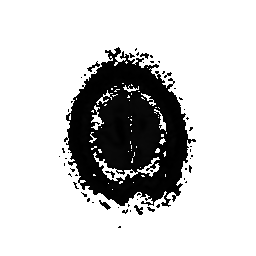

[Series 800: DWI · coronal · 5.0mm · 0.94mm/px · 2 of 33 slices shown (4 of 4)]
[im 1/33]
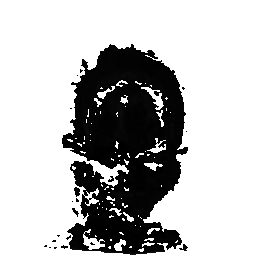
[im 33/33]
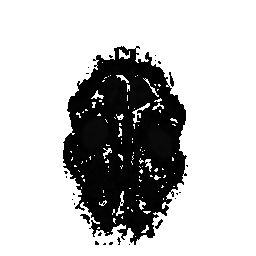

[27 of 48 positions shown; findings below may reference images not displayed]

FINDINGS: MRI head: The ventricles and sulci are normal for patient's age. No
abnormal parenchymal signal, mass lesions, mass effect. No reduced
diffusion to suggest acute ischemia. No susceptibility artifact to
suggest hemorrhage.

No abnormal extra-axial fluid collections. No extra-axial masses
though, contrast enhanced sequences would be more sensitive. Normal
major intracranial vascular flow voids seen at the skull base.

Ocular globes and orbital contents are unremarkable though not
tailored for evaluation. No abnormal sellar expansion. RIGHT
maxillary mucosal retention cysts without paranasal sinus air-fluid
levels. The mastoid air cells are well aerated. No suspicious
calvarial bone marrow signal. No abnormal sellar expansion.
Craniocervical junction maintained.

MRV head: Normal flow related enhancement of superior sagittal sinus
and torcula of oral Foley. The RIGHT transverse sinus is dominant,
with flow voids enhancement of the sigmoid and internal jugular
veins.

Normal flow related enhancement within the internal cerebral veins
though, not tailored for evaluation.
IMPRESSION: Normal noncontrast MRI of the head. However, findings of
intracranial hypotension better appreciated on contrast-enhanced
imaging.

No dural venous sinus thrombosis though, contrast MRV may be more
sensitive.

By: Dayani Rothschild

## 2016-06-07 ENCOUNTER — Other Ambulatory Visit (INDEPENDENT_AMBULATORY_CARE_PROVIDER_SITE_OTHER): Payer: Self-pay | Admitting: Orthopaedic Surgery

## 2016-06-08 NOTE — Telephone Encounter (Signed)
Please advise. Thank you

## 2017-03-15 ENCOUNTER — Other Ambulatory Visit: Payer: Self-pay | Admitting: Endocrinology

## 2017-03-15 DIAGNOSIS — R221 Localized swelling, mass and lump, neck: Secondary | ICD-10-CM

## 2017-03-22 ENCOUNTER — Ambulatory Visit
Admission: RE | Admit: 2017-03-22 | Discharge: 2017-03-22 | Disposition: A | Discharge status: Home / Self Care | Payer: BC Managed Care – PPO | Source: Ambulatory Visit | Attending: Endocrinology | Admitting: Endocrinology

## 2017-03-22 DIAGNOSIS — R221 Localized swelling, mass and lump, neck: Secondary | ICD-10-CM

## 2017-03-22 MED ORDER — IOPAMIDOL (ISOVUE-300) INJECTION 61%
75.0000 mL | Freq: Once | INTRAVENOUS | Status: AC | PRN
  Administered 2017-03-22: 75 mL via INTRAVENOUS

## 2017-07-20 ENCOUNTER — Telehealth (INDEPENDENT_AMBULATORY_CARE_PROVIDER_SITE_OTHER): Payer: Self-pay | Admitting: Orthopaedic Surgery

## 2017-07-20 NOTE — Telephone Encounter (Signed)
See message below °

## 2017-07-20 NOTE — Telephone Encounter (Signed)
Patient is requesting rx refill on celecoxib, she said it was the generic of celebrex. She hasnt seen Dr. Roda ShuttersXu in a couple of years so she doesn't know if he would need to see her first or if he can call it in. She now uses [facility] on [address]. Please advise when it is called in or if she needs to make an appt. Patients # [phone]

## 2017-07-21 ENCOUNTER — Other Ambulatory Visit (INDEPENDENT_AMBULATORY_CARE_PROVIDER_SITE_OTHER): Payer: Self-pay

## 2017-07-21 MED ORDER — CELECOXIB 200 MG PO CAPS: ORAL_CAPSULE | ORAL | 0 refills | Status: DC

## 2017-07-21 NOTE — Telephone Encounter (Signed)
Faxed into pharm  

## 2017-07-21 NOTE — Telephone Encounter (Signed)
Celebrex is fine

## 2017-08-25 ENCOUNTER — Other Ambulatory Visit (INDEPENDENT_AMBULATORY_CARE_PROVIDER_SITE_OTHER): Payer: Self-pay

## 2017-08-25 MED ORDER — CELECOXIB 200 MG PO CAPS: ORAL_CAPSULE | ORAL | 12 refills | Status: DC

## 2018-02-26 ENCOUNTER — Telehealth (INDEPENDENT_AMBULATORY_CARE_PROVIDER_SITE_OTHER): Payer: Self-pay | Admitting: Orthopaedic Surgery

## 2018-02-26 NOTE — Telephone Encounter (Signed)
Patient request approval for Gel injection

## 2018-02-27 ENCOUNTER — Telehealth (INDEPENDENT_AMBULATORY_CARE_PROVIDER_SITE_OTHER): Payer: Self-pay

## 2018-02-27 NOTE — Telephone Encounter (Signed)
Please submit gel injection. Thank you.  

## 2018-02-27 NOTE — Telephone Encounter (Signed)
Sent msg to April

## 2018-03-06 NOTE — Telephone Encounter (Signed)
Noted  

## 2018-03-08 ENCOUNTER — Telehealth (INDEPENDENT_AMBULATORY_CARE_PROVIDER_SITE_OTHER): Payer: Self-pay

## 2018-03-08 NOTE — Telephone Encounter (Signed)
Submitted VOB for SynviscOne, bilateral knee. 

## 2018-03-21 ENCOUNTER — Telehealth (INDEPENDENT_AMBULATORY_CARE_PROVIDER_SITE_OTHER): Payer: Self-pay

## 2018-03-21 NOTE — Telephone Encounter (Signed)
Patient called to check on the status of the authorization for her gel injection.  CB#[id number].  Thank you.

## 2018-03-21 NOTE — Telephone Encounter (Signed)
Talked with patient concerning gel injection.  

## 2018-03-21 NOTE — Telephone Encounter (Signed)
PA required for SynviscOne, bilateral knee. Faxed completed PA form to [employer] at [phone]. 

## 2018-03-26 ENCOUNTER — Telehealth (INDEPENDENT_AMBULATORY_CARE_PROVIDER_SITE_OTHER): Payer: Self-pay

## 2018-03-26 NOTE — Telephone Encounter (Signed)
Talked with patient and advised her that she is approved for gel injection.  Approved for SynviscOne, bilateral knee. Buy & Bill Covered at 100% after Co-pay Co-pay of $80.00 PA required PA Approval# [id number] 96.00 Units Valid 03/21/2018- 03/21/2019  Appt. 03/27/2018 with Dr. Roda Shutters

## 2018-03-27 ENCOUNTER — Ambulatory Visit (INDEPENDENT_AMBULATORY_CARE_PROVIDER_SITE_OTHER): Payer: BC Managed Care – PPO | Admitting: Orthopaedic Surgery

## 2018-03-27 DIAGNOSIS — M170 Bilateral primary osteoarthritis of knee: Secondary | ICD-10-CM

## 2018-03-27 MED ORDER — HYLAN G-F 20 48 MG/6ML IX SOSY
48.0000 mg | PREFILLED_SYRINGE | INTRA_ARTICULAR | Status: AC | PRN
  Administered 2018-03-27: 48 mg via INTRA_ARTICULAR

## 2018-03-27 MED ORDER — LIDOCAINE HCL 1 % IJ SOLN
3.0000 mL | INTRAMUSCULAR | Status: AC | PRN
  Administered 2018-03-27: 3 mL

## 2018-03-27 MED ORDER — BUPIVACAINE HCL 0.25 % IJ SOLN
0.6600 mL | INTRAMUSCULAR | Status: AC | PRN
  Administered 2018-03-27: .66 mL via INTRA_ARTICULAR

## 2018-03-27 NOTE — Progress Notes (Signed)
   Procedure Note  Patient: Anna Glover             Date of Birth: 1975/07/31           MRN: [id number]             Visit Date: 03/27/2018  Procedures: Visit Diagnoses: Bilateral primary osteoarthritis of knee - Plan: Large Joint Inj: bilateral knee  Large Joint Inj: bilateral knee on 03/27/2018 4:48 PM Indications: pain Details: 22 G needle, anterolateral approach Medications (Right): 0.66 mL bupivacaine 0.25 %; 3 mL lidocaine 1 %; 48 mg Hylan 48 MG/6ML Medications (Left): 0.66 mL bupivacaine 0.25 %; 3 mL lidocaine 1 %; 48 mg Hylan 48 MG/6ML

## 2018-08-06 ENCOUNTER — Telehealth: Payer: Self-pay | Admitting: Orthopaedic Surgery

## 2018-08-06 NOTE — Telephone Encounter (Signed)
Returned call to patient left message to call back to schedule an appointment with Dr Erlinda Hong for upper arm and right knee pain per patient request    The number to contact patient is [phone]

## 2018-08-09 ENCOUNTER — Other Ambulatory Visit: Payer: Self-pay

## 2018-08-09 ENCOUNTER — Ambulatory Visit: Payer: BC Managed Care – PPO

## 2018-08-09 ENCOUNTER — Ambulatory Visit (INDEPENDENT_AMBULATORY_CARE_PROVIDER_SITE_OTHER): Payer: BC Managed Care – PPO | Admitting: Orthopaedic Surgery

## 2018-08-09 DIAGNOSIS — M79622 Pain in left upper arm: Secondary | ICD-10-CM

## 2018-08-09 DIAGNOSIS — M170 Bilateral primary osteoarthritis of knee: Secondary | ICD-10-CM | POA: Diagnosis not present

## 2018-08-09 MED ORDER — BUPIVACAINE HCL 0.5 % IJ SOLN
2.0000 mL | INTRAMUSCULAR | Status: AC | PRN
  Administered 2018-08-09: 2 mL via INTRA_ARTICULAR

## 2018-08-09 MED ORDER — METHYLPREDNISOLONE ACETATE 40 MG/ML IJ SUSP
40.0000 mg | INTRAMUSCULAR | Status: AC | PRN
  Administered 2018-08-09: 40 mg via INTRA_ARTICULAR

## 2018-08-09 MED ORDER — LIDOCAINE HCL 1 % IJ SOLN
2.0000 mL | INTRAMUSCULAR | Status: AC | PRN
  Administered 2018-08-09: 2 mL

## 2018-08-09 NOTE — Progress Notes (Signed)
Office Visit Note   Patient: Anna Glover           Date of Birth: [birth year]           MRN: [id number] Visit Date: 08/09/2018              Requested by: Annetta Maw, MD [address] [place],  [place] [address] PCP: Annetta Maw, MD   Assessment & Plan: Visit Diagnoses:  1. Bilateral primary osteoarthritis of knee   2. 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  For the right knee we provide her with a cortisone injection today in hopes of giving her some relief.  She is to take it easy over the next couple days.  If she does not notice any improvement over the next couple weeks we would likely need to obtain an MRI to rule out structural abnormalities.  For the left arm I recommend symptomatic treatment with heat and ice and stretching.  She has no neuro deficits.  Anticipate that this will be self-limiting.  Questions encouraged and answered.  Follow-up as needed.  Follow-Up Instructions: Return if symptoms worsen or fail to improve.   Orders:  Orders Placed This Encounter  Procedures  . XR KNEE 3 VIEW RIGHT  . XR KNEE 3 VIEW LEFT   No orders of the defined types were placed in this encounter.     Procedures: Large Joint Inj: R knee on 08/09/2018 8:52 AM Indications: pain Details: 22 G needle  Arthrogram: No  Medications: 40 mg methylPREDNISolone acetate 40 MG/ML; 2 mL lidocaine 1 %; 2 mL bupivacaine 0.5 % Consent was given by the patient. Patient was prepped and draped in the usual sterile fashion.       Clinical Data: No additional findings.   Subjective: No chief complaint on file.   Anna Glover is a 43 year old female that I have been seeing for the last couple years who comes in for acute worsening of her right knee pain and left upper arm pain.  In terms of the right knee pain she states that it acutely started after she was chasing a  deer.  She has had continued anterior knee pain and parapatellar pain.  Denies any mechanical symptoms or swelling.  She does feels increased anterior knee pain with deep flexion of the knee.  For the left arm she states that she had acute onset of pain in her triceps area when she was taking off her sports bra.  She denies any numbness and tingling.  She has no motor deficits.  Denies any neck pain or radicular symptoms.   Review of Systems  Constitutional: Negative.   HENT: Negative.   Eyes: Negative.   Respiratory: Negative.   Cardiovascular: Negative.   Endocrine: Negative.   Musculoskeletal: Negative.   Neurological: Negative.   Hematological: Negative.   Psychiatric/Behavioral: Negative.   All other systems reviewed and are negative.    Objective: Vital Signs: There were no vitals taken for this visit.  Physical Exam Vitals signs and nursing note reviewed.  Constitutional:      Appearance: She is well-developed.  Pulmonary:     Effort: Pulmonary effort is normal.  Skin:    General: Skin is warm.     Capillary Refill: Capillary refill takes less than 2 seconds.  Neurological:     Mental Status: She is alert and oriented to person,  place, and time.  Psychiatric:        Behavior: Behavior normal.        Thought Content: Thought content normal.        Judgment: Judgment normal.     Ortho Exam Right knee exam shows a trace joint effusion.  Collaterals and cruciates are stable.  No focal joint line tenderness. Left arm exam shows no motor or sensory deficits.  She is very tender near the proximal insertion of the deep head of the triceps.  No neurovascular compromise.  She has discomfort with resisted triceps extension. Specialty Comments:  No specialty comments available.  Imaging: Xr Knee 3 View Left  Result Date: 08/09/2018 No acute or structural abnormalities  Xr Knee 3 View Right  Result Date: 08/09/2018 No acute or structural abnormalities.     PMFS  History: Patient Active Problem List   Diagnosis Date Noted  . Headache 06/03/2014  . 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 Thrombocytopenia (HCC) 06/03/2014  . Elevated LFTs 06/03/2014   Past Medical History:  Diagnosis Date  . Anxiety   . Depression   . Hypothyroidism   . Thyroid disease     Family History  Problem Relation Age of Onset  . Hypertension Mother   . Hyperlipidemia Mother   . Hypertension Father   . Heart disease Father   . Pulmonary fibrosis Father   . Migraines Neg Hx     Past Surgical History:  Procedure Laterality Date  . CESAREAN SECTION  02/25/2003  . CESAREAN SECTION  05/01/2009  . CHOLECYSTECTOMY  June 2014   Social History   Occupational History  . Occupation: MidwifeKindergarten Teacher  Tobacco Use  . Smoking status: Former Smoker    Quit date: 07/10/2009    Years since quitting: 9.0  . Smokeless tobacco: Never Used  Substance and Sexual Activity  . Alcohol use: Yes    Alcohol/week: 1.0 standard drinks    Types: 1 Glasses of wine per week    Comment: occ: 1-2 glasses wine every other day   . Drug use: No  . Sexual activity: Not on file

## 2018-08-12 ENCOUNTER — Other Ambulatory Visit (INDEPENDENT_AMBULATORY_CARE_PROVIDER_SITE_OTHER): Payer: Self-pay | Admitting: Orthopaedic Surgery

## 2018-11-29 ENCOUNTER — Other Ambulatory Visit: Payer: Self-pay

## 2018-11-29 DIAGNOSIS — Z20822 Contact with and (suspected) exposure to covid-19: Secondary | ICD-10-CM

## 2018-11-30 LAB — NOVEL CORONAVIRUS, NAA: SARS-CoV-2, NAA: NOT DETECTED

## 2018-12-14 ENCOUNTER — Other Ambulatory Visit: Payer: Self-pay

## 2018-12-14 DIAGNOSIS — Z20822 Contact with and (suspected) exposure to covid-19: Secondary | ICD-10-CM

## 2018-12-16 LAB — NOVEL CORONAVIRUS, NAA: SARS-CoV-2, NAA: NOT DETECTED

## 2019-03-28 ENCOUNTER — Ambulatory Visit: Payer: BC Managed Care – PPO

## 2019-04-11 ENCOUNTER — Ambulatory Visit: Payer: BC Managed Care – PPO

## 2019-06-11 ENCOUNTER — Ambulatory Visit: Payer: BC Managed Care – PPO | Admitting: Orthopaedic Surgery

## 2019-12-03 ENCOUNTER — Ambulatory Visit: Payer: BC Managed Care – PPO | Admitting: Orthopaedic Surgery

## 2019-12-03 ENCOUNTER — Other Ambulatory Visit: Payer: Self-pay

## 2019-12-03 DIAGNOSIS — M7989 Other specified soft tissue disorders: Secondary | ICD-10-CM

## 2019-12-03 NOTE — Progress Notes (Signed)
Office Visit Note   Patient: Anna Glover           Date of Birth: September 27, 1975           MRN: [id number] Visit Date: 12/03/2019              Requested by: Dolan Amen, MD [address] [place],  [place] [address] PCP: Dolan Amen, MD   Assessment & Plan: Visit Diagnoses:  1. Left leg swelling     Plan: Impression is bilateral knee arthritis flareup and questionable DVT left lower extremity.  In regards to her bilateral knee pain, we will wait approximately 1-1/2 to 2 weeks before proceeding with repeat cortisone injections as she recently had the Covid booster this past Sunday.  In regards to the left calf pain, we will refer her for venous Doppler ultrasound to rule out DVT.  Should she develop any chest pain or shortness of breath in the meantime, she has been instructed to go to the ED.  Follow-Up Instructions:    Orders:  Orders Placed This Encounter  Procedures  . US Venous Img Lower Unilateral Left (DVT)   No orders of the defined types were placed in this encounter.     Procedures: No procedures performed   Clinical Data: No additional findings.   Subjective: Chief Complaint  Patient presents with  . Right Knee - Pain, Follow-up  . Left Knee - Pain, Follow-up    HPI patient is a very pleasant 44 year old female who comes in today with bilateral knee pain left greater than right as well as left calf pain and swelling.  In regards to her knee pain, this is been ongoing for over a year.  She is a long-distance runner and has a history of patellofemoral osteoarthritis.  She had bilateral cortisone injections in February 2020 and then right knee cortisone injection in June 2020 with great relief of symptoms until about 3 weeks ago.  No new injury or change in activity.  The pain is constant but worse going from sitting to standing as well as with running.  She occasionally even has pain with walking.  She does note locking and catching to the left knee.   She has been taking Advil or Celebrex without significant relief of symptoms.  In regards to the left calf pain, this recently started after having her Covid booster this past Sunday.  Her calf hurts even with walking.  She has noticed swelling as well.  No injury or change in activity.  No personal or family history of DVT or PE.  She is a non-smoker and does not take oral contraceptives.  Review of Systems as detailed in HPI.  All other reviewed and are negative.   Objective: Vital Signs: There were no vitals taken for this visit.  Physical Exam well-developed well-nourished female no acute distress.  Alert and oriented x3.  Ortho Exam bilateral knee exam shows no effusion.  Range of motion 0 to 120 degrees.  Minimal joint line tenderness both sides.  Moderate patellofemoral crepitus both sides.  Left calf is markedly tender and mildly swollen.  No erythema.  She does have a positive Homans' sign.  She is neurovascular intact distally.  Specialty Comments:  No specialty comments available.  Imaging: No new imaging   PMFS History: Patient Active Problem List   Diagnosis Date Noted  . Headache 06/03/2014  . 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 Thrombocytopenia (HCC) 06/03/2014  . Elevated LFTs 06/03/2014  Past Medical History:  Diagnosis Date  . Anxiety   . Depression   . Hypothyroidism   . Thyroid disease     Family History  Problem Relation Age of Onset  . Hypertension Mother   . Hyperlipidemia Mother   . Hypertension Father   . Heart disease Father   . Pulmonary fibrosis Father   . Migraines Neg Hx     Past Surgical History:  Procedure Laterality Date  . CESAREAN SECTION  02/25/2003  . CESAREAN SECTION  05/01/2009  . CHOLECYSTECTOMY  June 2014   Social History   Occupational History  . Occupation: Midwife  Tobacco Use  . Smoking status: Former Smoker    Quit date: 07/10/2009    Years since quitting: 10.4  . Smokeless tobacco: Never Used    Substance and Sexual Activity  . Alcohol use: Yes    Alcohol/week: 1.0 standard drink    Types: 1 Glasses of wine per week    Comment: occ: 1-2 glasses wine every other day   . Drug use: No  . Sexual activity: Not on file

## 2019-12-04 ENCOUNTER — Ambulatory Visit (HOSPITAL_COMMUNITY)
Admission: RE | Admit: 2019-12-04 | Discharge: 2019-12-04 | Disposition: A | Discharge status: Home / Self Care | Payer: BC Managed Care – PPO | Source: Ambulatory Visit | Attending: Orthopaedic Surgery | Admitting: Orthopaedic Surgery

## 2019-12-04 ENCOUNTER — Other Ambulatory Visit: Payer: Self-pay | Admitting: Orthopaedic Surgery

## 2019-12-04 ENCOUNTER — Telehealth: Payer: Self-pay

## 2019-12-04 DIAGNOSIS — M7989 Other specified soft tissue disorders: Secondary | ICD-10-CM | POA: Diagnosis not present

## 2019-12-04 NOTE — Telephone Encounter (Signed)
Per Tammy, she states negative for blood clots

## 2019-12-04 NOTE — Telephone Encounter (Signed)
Ok good

## 2019-12-17 ENCOUNTER — Ambulatory Visit: Payer: Self-pay

## 2019-12-17 ENCOUNTER — Other Ambulatory Visit: Payer: Self-pay

## 2019-12-17 ENCOUNTER — Encounter: Payer: Self-pay | Admitting: Orthopaedic Surgery

## 2019-12-17 ENCOUNTER — Ambulatory Visit (INDEPENDENT_AMBULATORY_CARE_PROVIDER_SITE_OTHER): Payer: BC Managed Care – PPO | Admitting: Orthopaedic Surgery

## 2019-12-17 DIAGNOSIS — M170 Bilateral primary osteoarthritis of knee: Secondary | ICD-10-CM | POA: Diagnosis not present

## 2019-12-17 NOTE — Progress Notes (Signed)
Office Visit Note   Patient: Anna Glover           Date of Birth: 1975-03-10           MRN: [id number] Visit Date: 12/17/2019              Requested by: Dolan Amen, MD [address] [place],  [place] [address] PCP: Dolan Amen, MD   Assessment & Plan: Visit Diagnoses:  1. Osteoarthritis of patellofemoral joints, bilateral     Plan: Impression is bilateral knee patellofemoral osteoarthritis.  We injected both knees with cortisone today.  We also discussed viscosupplementation injections should her symptoms not improve with cortisone.  She will follow up with [place] as needed.  Follow-Up Instructions: Return if symptoms worsen or fail to improve.   Orders:  Orders Placed This Encounter  Procedures  . XR KNEE 3 VIEW LEFT  . XR KNEE 3 VIEW RIGHT   No orders of the defined types were placed in this encounter.     Procedures: Large Joint Inj: bilateral knee on 12/17/2019 4:22 PM Indications: pain Details: 22 G needle, anterolateral approach Medications (Right): 0.66 mL bupivacaine 0.25 %; 3 mL lidocaine 1 %; 13.33 mg methylPREDNISolone acetate 40 MG/ML Medications (Left): 0.66 mL bupivacaine 0.25 %; 3 mL lidocaine 1 %; 13.33 mg methylPREDNISolone acetate 40 MG/ML      Clinical Data: No additional findings.   Subjective: Chief Complaint  Patient presents with  . Right Knee - Pain  . Left Knee - Pain    HPI patient is a very pleasant 44 year old female who comes in today with recurrent bilateral knee pain.  She has been dealing with this for several years.  However pain is retropatellar and is worse with squatting and kneeling.  She is an avid runner and this is starting to affect her ability to run.  She had a right knee cortisone injection in June 2020 as well as both knees injected with viscosupplementation in February 2020.  Review of Systems as detailed in HPI.  All other reviewed and are negative.   Objective: Vital Signs: There were no vitals  taken for this visit.  Physical Exam well-developed well-nourished female no acute distress.  Alert and oriented x3.  Ortho Exam bilateral knee exam shows no effusions.  Range of motion 0 to 120 degrees.  No joint line tenderness.  Moderate patellofemoral crepitus.  Ligaments are stable.  She is neurovascular intact distally.  Specialty Comments:  No specialty comments available.  Imaging: XR KNEE 3 VIEW LEFT  Result Date: 12/17/2019 Moderate degenerative changes the patellofemoral compartment  XR KNEE 3 VIEW RIGHT  Result Date: 12/17/2019 Moderate degenerative changes the patellofemoral compartment    PMFS History: Patient Active Problem List   Diagnosis Date Noted  . Headache 06/03/2014  . 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 Thrombocytopenia (HCC) 06/03/2014  . Elevated LFTs 06/03/2014   Past Medical History:  Diagnosis Date  . Anxiety   . Depression   . Hypothyroidism   . Thyroid disease     Family History  Problem Relation Age of Onset  . Hypertension Mother   . Hyperlipidemia Mother   . Hypertension Father   . Heart disease Father   . Pulmonary fibrosis Father   . Migraines Neg Hx     Past Surgical History:  Procedure Laterality Date  . CESAREAN SECTION  02/25/2003  . CESAREAN SECTION  05/01/2009  . CHOLECYSTECTOMY  June 2014   Social History   Occupational  History  . Occupation: Midwife  Tobacco Use  . Smoking status: Former Smoker    Quit date: 07/10/2009    Years since quitting: 10.4  . Smokeless tobacco: Never Used  Substance and Sexual Activity  . Alcohol use: Yes    Alcohol/week: 1.0 standard drink    Types: 1 Glasses of wine per week    Comment: occ: 1-2 glasses wine every other day   . Drug use: No  . Sexual activity: Not on file

## 2019-12-18 MED ORDER — LIDOCAINE HCL 1 % IJ SOLN
3.0000 mL | INTRAMUSCULAR | Status: AC | PRN
  Administered 2019-12-17: 3 mL

## 2019-12-18 MED ORDER — BUPIVACAINE HCL 0.25 % IJ SOLN
0.6600 mL | INTRAMUSCULAR | Status: AC | PRN
  Administered 2019-12-17: .66 mL via INTRA_ARTICULAR

## 2019-12-18 MED ORDER — METHYLPREDNISOLONE ACETATE 40 MG/ML IJ SUSP
13.3300 mg | INTRAMUSCULAR | Status: AC | PRN
  Administered 2019-12-17: 13.33 mg via INTRA_ARTICULAR

## 2020-01-14 ENCOUNTER — Encounter: Payer: Self-pay | Admitting: Orthopaedic Surgery

## 2020-01-14 ENCOUNTER — Other Ambulatory Visit: Payer: Self-pay

## 2020-01-14 ENCOUNTER — Ambulatory Visit: Payer: BC Managed Care – PPO | Admitting: Orthopaedic Surgery

## 2020-01-14 DIAGNOSIS — M170 Bilateral primary osteoarthritis of knee: Secondary | ICD-10-CM | POA: Diagnosis not present

## 2020-01-14 NOTE — Progress Notes (Signed)
Office Visit Note   Patient: Anna Glover           Date of Birth: 1975/11/28           MRN: [id number] Visit Date: 01/14/2020              Requested by: Dolan Amen, MD [address] [place],  [place] [address] PCP: Dolan Amen, MD   Assessment & Plan: Visit Diagnoses:  1. Osteoarthritis of patellofemoral joints, bilateral     Plan: Impression is bilateral knee patellofemoral osteoarthritis.  Today, we discussed viscosupplementation injections as well as the possibility of limiting her running should her symptoms not improve with injections.  She would like to submit for approval for viscosupplementation injection.  She will follow up with [place] once approved. This patient is diagnosed with osteoarthritis of the knee(s).    Radiographs show evidence of joint space narrowing, osteophytes, subchondral sclerosis and/or subchondral cysts.  This patient has knee pain which interferes with functional and activities of daily living.    This patient has experienced inadequate response, adverse effects and/or intolerance with conservative treatments such as acetaminophen, NSAIDS, topical creams, physical therapy or regular exercise, knee bracing and/or weight loss.   This patient has experienced inadequate response or has a contraindication to intra articular steroid injections for at least 3 months.   This patient is not scheduled to have a total knee replacement within 6 months of starting treatment with viscosupplementation.   Follow-Up Instructions: Return for after bilateral knee visco approval.   Orders:  No orders of the defined types were placed in this encounter.  No orders of the defined types were placed in this encounter.     Procedures: No procedures performed   Clinical Data: No additional findings.   Subjective: Chief Complaint  Patient presents with  . Right Knee - Pain  . Left Knee - Pain    HPI patient is a pleasant 44 year old female who  comes in today with recurrent bilateral knee pain.  History of degenerative changes to the patellofemoral compartments of both knees.  We have been seeing her for this for a while now.  She has had both knees injected with cortisone as well as viscosupplementation in the past.  We recently saw her as her pain had started to worsen while running.  We injected her knees with cortisone which mildly improved her symptoms.  She is still having pain with running.  Minimal to no pain at rest.  Review of Systems as detailed in HPI.  All others reviewed and are negative.   Objective: Vital Signs: There were no vitals taken for this visit.  Physical Exam well-developed well-nourished female in no acute distress.  Alert oriented x3.  Ortho Exam stable exam of both knees  Specialty Comments:  No specialty comments available.  Imaging: No new imaging   PMFS History: Patient Active Problem List   Diagnosis Date Noted  . Headache 06/03/2014  . 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 Thrombocytopenia (HCC) 06/03/2014  . Elevated LFTs 06/03/2014   Past Medical History:  Diagnosis Date  . Anxiety   . Depression   . Hypothyroidism   . Thyroid disease     Family History  Problem Relation Age of Onset  . Hypertension Mother   . Hyperlipidemia Mother   . Hypertension Father   . Heart disease Father   . Pulmonary fibrosis Father   . Migraines Neg Hx     Past Surgical History:  Procedure  Laterality Date  . CESAREAN SECTION  02/25/2003  . CESAREAN SECTION  05/01/2009  . CHOLECYSTECTOMY  June 2014   Social History   Occupational History  . Occupation: Midwife  Tobacco Use  . Smoking status: Former Smoker    Quit date: 07/10/2009    Years since quitting: 10.5  . Smokeless tobacco: Never Used  Substance and Sexual Activity  . Alcohol use: Yes    Alcohol/week: 1.0 standard drink    Types: 1 Glasses of wine per week    Comment: occ: 1-2 glasses wine every other day   .  Drug use: No  . Sexual activity: Not on file

## 2020-01-15 ENCOUNTER — Telehealth: Payer: Self-pay

## 2020-01-15 NOTE — Telephone Encounter (Signed)
Please submit for bil knee gel inj °

## 2020-01-15 NOTE — Telephone Encounter (Signed)
Noted  

## 2020-01-31 ENCOUNTER — Telehealth: Payer: Self-pay

## 2020-01-31 ENCOUNTER — Telehealth: Payer: Self-pay | Admitting: Orthopaedic Surgery

## 2020-01-31 NOTE — Telephone Encounter (Signed)
Pt would like to be updated on how much longer before she will know if she's been approved or not for the gel injections; she would like a CB   [phone]

## 2020-01-31 NOTE — Telephone Encounter (Signed)
Submitted VOB, SynviscOne, bilateral knee. 

## 2020-01-31 NOTE — Telephone Encounter (Signed)
Called and left a VM for patient to call back concerning gel injection.

## 2020-02-05 NOTE — Telephone Encounter (Signed)
Pt called asking about the status of her gel inj

## 2020-02-11 NOTE — Telephone Encounter (Signed)
Talked with patient concerning gel injection.  Advised her that product is not covered due to her insurance not be effective until 02/15/2020.  Advised patient that I will submit after January 4th, 2022.  Patient voiced that she understand.

## 2020-02-19 ENCOUNTER — Telehealth: Payer: Self-pay

## 2020-02-19 NOTE — Telephone Encounter (Signed)
Submitted VOB, SynviscOne, bilateral knee. 

## 2020-02-24 ENCOUNTER — Telehealth: Payer: Self-pay | Admitting: Orthopaedic Surgery

## 2020-02-24 ENCOUNTER — Telehealth: Payer: Self-pay

## 2020-02-24 NOTE — Telephone Encounter (Signed)
PA required for SynviscOne, bilateral knee. Faxed completed PA form to [employer] at [phone]. 

## 2020-02-24 NOTE — Telephone Encounter (Signed)
Pt called wanting to know the status of her gel injections; she would like a CB to be updated and she states that if she doesn't answer please leave a message please.   [phone]

## 2020-02-24 NOTE — Telephone Encounter (Signed)
Talked with patient and advised her that a PA is required and once approved through her insurance, she will receive a call to schedule an appointment.  Patient voiced that she understands.

## 2020-03-09 ENCOUNTER — Telehealth: Payer: Self-pay

## 2020-03-09 NOTE — Telephone Encounter (Signed)
Approved, SynviscOne, bilateral knee. Buy & Bill Patient is covered at 100% after co-pay Co-pay of $80.00-$160.00 required PA required PA Approval# [id number] Valid 02/24/2020- 08/22/2020  Appt. 03/18/2020 with Mardella Layman

## 2020-03-18 ENCOUNTER — Ambulatory Visit: Payer: BC Managed Care – PPO | Admitting: Physician Assistant

## 2020-03-31 ENCOUNTER — Ambulatory Visit: Payer: BC Managed Care – PPO | Admitting: Orthopaedic Surgery

## 2020-03-31 DIAGNOSIS — M1711 Unilateral primary osteoarthritis, right knee: Secondary | ICD-10-CM | POA: Insufficient documentation

## 2020-03-31 DIAGNOSIS — M1712 Unilateral primary osteoarthritis, left knee: Secondary | ICD-10-CM

## 2020-03-31 MED ORDER — BUPIVACAINE HCL 0.5 % IJ SOLN
2.0000 mL | INTRAMUSCULAR | Status: AC | PRN
  Administered 2020-03-31: 2 mL via INTRA_ARTICULAR

## 2020-03-31 MED ORDER — HYLAN G-F 20 48 MG/6ML IX SOSY
48.0000 mg | PREFILLED_SYRINGE | INTRA_ARTICULAR | Status: AC | PRN
  Administered 2020-03-31: 48 mg via INTRA_ARTICULAR

## 2020-03-31 MED ORDER — LIDOCAINE HCL 1 % IJ SOLN
2.0000 mL | INTRAMUSCULAR | Status: AC | PRN
  Administered 2020-03-31: 2 mL

## 2020-03-31 NOTE — Progress Notes (Signed)
   Office Visit Note   Patient: Anna Glover           Date of Birth: Apr 20, 1975           MRN: [id number] Visit Date: 03/31/2020              Requested by: Dolan Amen, MD [address] [place],  [place] [address] PCP: Dolan Amen, MD   Assessment & Plan: Visit Diagnoses:  1. Primary osteoarthritis of right knee   2. Primary osteoarthritis of left knee     Plan: bilateral knee synvisc injections performed.  Tolerated well.    Follow-Up Instructions: Return if symptoms worsen or fail to improve.   Orders:  No orders of the defined types were placed in this encounter.  No orders of the defined types were placed in this encounter.     Procedures: Large Joint Inj: bilateral knee on 03/31/2020 4:13 PM Indications: pain Details: 22 G needle  Arthrogram: No  Medications (Right): 2 mL lidocaine 1 %; 2 mL bupivacaine 0.5 %; 48 mg Hylan 48 MG/6ML Medications (Left): 2 mL lidocaine 1 %; 2 mL bupivacaine 0.5 %; 48 mg Hylan 48 MG/6ML Outcome: tolerated well, no immediate complications Patient was prepped and draped in the usual sterile fashion.       Clinical Data: No additional findings.   Subjective: Chief Complaint  Patient presents with  . Right Knee - Pain  . Left Knee - Pain    Darrien is here for bilateral knee synvisc injections today.  These injections have helped pain and decreased her requirement for pain medications.    Review of Systems   Objective: Vital Signs: There were no vitals taken for this visit.  Physical Exam  Ortho Exam  Specialty Comments:  No specialty comments available.  Imaging: No results found.   PMFS History: Patient Active Problem List   Diagnosis Date Noted  . Primary osteoarthritis of right knee 03/31/2020  . Primary osteoarthritis of left knee 03/31/2020  . Headache 06/03/2014  . 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 Thrombocytopenia (HCC) 06/03/2014  . Elevated LFTs 06/03/2014   Past Medical  History:  Diagnosis Date  . Anxiety   . Depression   . Hypothyroidism   . Thyroid disease     Family History  Problem Relation Age of Onset  . Hypertension Mother   . Hyperlipidemia Mother   . Hypertension Father   . Heart disease Father   . Pulmonary fibrosis Father   . Migraines Neg Hx     Past Surgical History:  Procedure Laterality Date  . CESAREAN SECTION  02/25/2003  . CESAREAN SECTION  05/01/2009  . CHOLECYSTECTOMY  June 2014   Social History   Occupational History  . Occupation: Midwife  Tobacco Use  . Smoking status: Former Smoker    Quit date: 07/10/2009    Years since quitting: 10.7  . Smokeless tobacco: Never Used  Substance and Sexual Activity  . Alcohol use: Yes    Alcohol/week: 1.0 standard drink    Types: 1 Glasses of wine per week    Comment: occ: 1-2 glasses wine every other day   . Drug use: No  . Sexual activity: Not on file

## 2020-06-16 ENCOUNTER — Other Ambulatory Visit: Payer: Self-pay

## 2020-06-16 ENCOUNTER — Ambulatory Visit: Payer: BC Managed Care – PPO | Admitting: Orthopaedic Surgery

## 2020-06-16 ENCOUNTER — Encounter: Payer: Self-pay | Admitting: Orthopaedic Surgery

## 2020-06-16 VITALS — Ht 72.0 in | Wt 189.0 lb

## 2020-06-16 DIAGNOSIS — M1712 Unilateral primary osteoarthritis, left knee: Secondary | ICD-10-CM

## 2020-06-16 NOTE — Progress Notes (Signed)
Office Visit Note   Patient: Anna Glover           Date of Birth: [birth year]           MRN: [id number] Visit Date: 06/16/2020              Requested by: Dolan Amen, MD [address] [place],  [place] [address] PCP: Dolan Amen, MD   Assessment & Plan: Visit Diagnoses:  1. Primary osteoarthritis of left knee     Plan: I was able to aspirate approximately 13 cc of blood-tinged joint fluid.  Cortisone injection was also performed.  Compression wrap applied.  We will need to obtain MRI to evaluate for medial meniscal tear.  Follow-up after the MRI.  Follow-Up Instructions: Return if symptoms worsen or fail to improve.   Orders:  No orders of the defined types were placed in this encounter.  No orders of the defined types were placed in this encounter.     Procedures: No procedures performed   Clinical Data: No additional findings.   Subjective: Chief Complaint  Patient presents with  . Left Knee - Pain    Anna Glover is a 45 year old female comes in today for recent worsening of left knee pain after she [place] .  She has noticed significant worsening symptoms of locking and swelling that gives a giving way sensation.  She cannot recall a specific injury.  She feels pain and mechanical symptoms on the medial side of the knee.   Review of Systems  Constitutional: Negative.   HENT: Negative.   Eyes: Negative.   Respiratory: Negative.   Cardiovascular: Negative.   Endocrine: Negative.   Musculoskeletal: Negative.   Neurological: Negative.   Hematological: Negative.   Psychiatric/Behavioral: Negative.   All other systems reviewed and are negative.    Objective: Vital Signs: Ht 6' (1.829 m)   Wt 189 lb (85.7 kg)   BMI 25.63 kg/m   Physical Exam Vitals and nursing note reviewed.  Constitutional:      Appearance: She is well-developed.  Pulmonary:     Effort: Pulmonary effort is normal.  Skin:    General: Skin is warm.      Capillary Refill: Capillary refill takes less than 2 seconds.  Neurological:     Mental Status: She is alert and oriented to person, place, and time.  Psychiatric:        Behavior: Behavior normal.        Thought Content: Thought content normal.        Judgment: Judgment normal.     Ortho Exam Left knee shows a small joint effusion.  Medial joint tenderness and positive McMurray's.  Increased pain with flexion of the knee past 95 degrees.  Collaterals and cruciates are stable. Specialty Comments:  No specialty comments available.  Imaging: No results found.   PMFS History: Patient Active Problem List   Diagnosis Date Noted  . Primary osteoarthritis of right knee 03/31/2020  . Primary osteoarthritis of left knee 03/31/2020  . Headache 06/03/2014  . 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 Thrombocytopenia (HCC) 06/03/2014  . Elevated LFTs 06/03/2014   Past Medical History:  Diagnosis Date  . Anxiety   . Depression   . Hypothyroidism   . Thyroid disease     Family History  Problem Relation Age of Onset  . Hypertension Mother   . Hyperlipidemia Mother   . Hypertension Father   . Heart disease Father   . Pulmonary fibrosis Father   .  Migraines Neg Hx     Past Surgical History:  Procedure Laterality Date  . CESAREAN SECTION  02/25/2003  . CESAREAN SECTION  05/01/2009  . CHOLECYSTECTOMY  June 2014   Social History   Occupational History  . Occupation: Midwife  Tobacco Use  . Smoking status: Former Smoker    Quit date: 07/10/2009    Years since quitting: 10.9  . Smokeless tobacco: Never Used  Substance and Sexual Activity  . Alcohol use: Yes    Alcohol/week: 1.0 standard drink    Types: 1 Glasses of wine per week    Comment: occ: 1-2 glasses wine every other day   . Drug use: No  . Sexual activity: Not on file

## 2020-06-16 NOTE — Addendum Note (Signed)
Addended by: Albertina Parr on: 06/16/2020 03:14 PM   Modules accepted: Orders

## 2020-06-27 ENCOUNTER — Other Ambulatory Visit: Payer: Self-pay

## 2020-06-27 ENCOUNTER — Ambulatory Visit
Admission: RE | Admit: 2020-06-27 | Discharge: 2020-06-27 | Disposition: A | Discharge status: Home / Self Care | Payer: BC Managed Care – PPO | Source: Ambulatory Visit | Attending: Orthopaedic Surgery | Admitting: Orthopaedic Surgery

## 2020-06-27 DIAGNOSIS — M1712 Unilateral primary osteoarthritis, left knee: Secondary | ICD-10-CM

## 2020-06-30 ENCOUNTER — Ambulatory Visit: Payer: BC Managed Care – PPO | Admitting: Orthopaedic Surgery

## 2020-06-30 ENCOUNTER — Encounter: Payer: Self-pay | Admitting: Orthopaedic Surgery

## 2020-06-30 VITALS — Ht 72.0 in | Wt 189.0 lb

## 2020-06-30 DIAGNOSIS — S83242A Other tear of medial meniscus, current injury, left knee, initial encounter: Secondary | ICD-10-CM | POA: Diagnosis not present

## 2020-06-30 NOTE — Progress Notes (Signed)
   Office Visit Note   Patient: Anna Glover           Date of Birth: [birth year]           MRN: [id number] Visit Date: 06/30/2020              Requested by: Dolan Amen, MD [address] [place],  [place] [address] PCP: Dolan Amen, MD   Assessment & Plan: Visit Diagnoses:  1. Acute medial meniscus tear of left knee, initial encounter     Plan: MRI shows a complex horizontal tear of the posterior horn the medial meniscus.  There is some chondromalacia on the medial and patellofemoral compartments.  These findings were reviewed with Myrene Buddy and given the lack of relief from conservative treatments I have recommended a partial medial meniscectomy.  Details of the surgery including risk benefits rehab recovery reviewed with the patient.  She elects to proceed with the surgery in the near future.  Follow-Up Instructions: No follow-ups on file.   Orders:  No orders of the defined types were placed in this encounter.  No orders of the defined types were placed in this encounter.     Procedures: No procedures performed   Clinical Data: No additional findings.   Subjective: Chief Complaint  Patient presents with  . Left Knee - Follow-up    MRI review    Trude returns today for review of recent left knee MRI.  No changes in symptoms.   Review of Systems   Objective: Vital Signs: Ht 6' (1.829 m)   Wt 189 lb (85.7 kg)   BMI 25.63 kg/m   Physical Exam  Ortho Exam Left knee shows continued medial joint line tenderness and McMurray. Specialty Comments:  No specialty comments available.  Imaging: No results found.   PMFS History: Patient Active Problem List   Diagnosis Date Noted  . Acute medial meniscus tear of left knee 06/30/2020  . Primary osteoarthritis of right knee 03/31/2020  . Primary osteoarthritis of left knee 03/31/2020  . Headache 06/03/2014  . 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 Thrombocytopenia (HCC) 06/03/2014  . Elevated  LFTs 06/03/2014   Past Medical History:  Diagnosis Date  . Anxiety   . Depression   . Hypothyroidism   . Thyroid disease     Family History  Problem Relation Age of Onset  . Hypertension Mother   . Hyperlipidemia Mother   . Hypertension Father   . Heart disease Father   . Pulmonary fibrosis Father   . Migraines Neg Hx     Past Surgical History:  Procedure Laterality Date  . CESAREAN SECTION  02/25/2003  . CESAREAN SECTION  05/01/2009  . CHOLECYSTECTOMY  June 2014   Social History   Occupational History  . Occupation: Midwife  Tobacco Use  . Smoking status: Former Smoker    Quit date: 07/10/2009    Years since quitting: 10.9  . Smokeless tobacco: Never Used  Substance and Sexual Activity  . Alcohol use: Yes    Alcohol/week: 1.0 standard drink    Types: 1 Glasses of wine per week    Comment: occ: 1-2 glasses wine every other day   . Drug use: No  . Sexual activity: Not on file

## 2020-07-17 ENCOUNTER — Encounter: Payer: BC Managed Care – PPO | Admitting: Orthopaedic Surgery

## 2020-07-20 ENCOUNTER — Other Ambulatory Visit: Payer: Self-pay | Admitting: Physician Assistant

## 2020-07-20 MED ORDER — HYDROCODONE-ACETAMINOPHEN 5-325 MG PO TABS: 1.0000 | ORAL_TABLET | Freq: Three times a day (TID) | ORAL | 0 refills | Status: DC | PRN

## 2020-07-20 MED ORDER — ONDANSETRON HCL 4 MG PO TABS: 4.0000 mg | ORAL_TABLET | Freq: Three times a day (TID) | ORAL | 0 refills | Status: DC | PRN

## 2020-07-23 ENCOUNTER — Encounter: Payer: Self-pay | Admitting: Orthopaedic Surgery

## 2020-07-23 DIAGNOSIS — S83242A Other tear of medial meniscus, current injury, left knee, initial encounter: Secondary | ICD-10-CM | POA: Diagnosis not present

## 2020-07-23 DIAGNOSIS — M659 Synovitis and tenosynovitis, unspecified: Secondary | ICD-10-CM | POA: Diagnosis not present

## 2020-07-30 ENCOUNTER — Ambulatory Visit (INDEPENDENT_AMBULATORY_CARE_PROVIDER_SITE_OTHER): Payer: BC Managed Care – PPO | Admitting: Orthopaedic Surgery

## 2020-07-30 ENCOUNTER — Other Ambulatory Visit: Payer: Self-pay

## 2020-07-30 ENCOUNTER — Encounter: Payer: Self-pay | Admitting: Orthopaedic Surgery

## 2020-07-30 ENCOUNTER — Ambulatory Visit (HOSPITAL_COMMUNITY)
Admission: RE | Admit: 2020-07-30 | Discharge: 2020-07-30 | Disposition: A | Discharge status: Home / Self Care | Payer: BC Managed Care – PPO | Source: Ambulatory Visit | Attending: Orthopaedic Surgery | Admitting: Orthopaedic Surgery

## 2020-07-30 VITALS — Ht 72.0 in | Wt 189.0 lb

## 2020-07-30 DIAGNOSIS — S83242A Other tear of medial meniscus, current injury, left knee, initial encounter: Secondary | ICD-10-CM | POA: Insufficient documentation

## 2020-07-30 NOTE — Progress Notes (Signed)
   Post-Op Visit Note   Patient: Anna Glover           Date of Birth: 05-31-1975           MRN: [id number] Visit Date: 07/30/2020 PCP: Dolan Amen, MD   Assessment & Plan:  Chief Complaint:  Chief Complaint  Patient presents with   Left Knee - Follow-up    Left knee arthroscopy 07/23/2020   Visit Diagnoses:  1. Acute medial meniscus tear of left knee, initial encounter     Plan: Fiorella is 1 week status post left knee scope partial medial meniscectomy.  She has some swelling of the knee and calf during the day but gets better overnight.  Not requiring any pain medications.  Currently walking with a single crutch.  She is feeling some calf swelling and soreness.  Left knee shows healed surgical incisions.  No signs of infection.  Mild swelling and trace effusion.  Calf is not overly tender or swollen.  Negative Homans' sign.  Range of motion is 0-95.  Krystale is doing well for 1 week after surgery.  Home exercises provided.  We went over the arthroscopic pictures today.  We talked about recommend activities versus ones that she should avoid.  We will get a Doppler just to rule out DVT although I think the risk is low.  We will recheck her in 3 weeks.  She will wean the crutches as needed.  Follow-Up Instructions: Return in about 3 weeks (around 08/20/2020).   Orders:  Orders Placed This Encounter  Procedures   VAS [place] LOWER EXTREMITY VENOUS (DVT)   No orders of the defined types were placed in this encounter.   Imaging: No results found.  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   Thrombocytopenia (HCC) 06/03/2014   Elevated LFTs 06/03/2014   Past Medical History:  Diagnosis Date   Anxiety    Depression    Hypothyroidism     Thyroid disease     Family History  Problem Relation Age of Onset   Hypertension Mother    Hyperlipidemia Mother    Hypertension Father    Heart disease Father    Pulmonary fibrosis Father    Migraines Neg Hx     Past Surgical History:  Procedure Laterality Date   CESAREAN SECTION  02/25/2003   CESAREAN SECTION  05/01/2009   CHOLECYSTECTOMY  June 2014   Social History   Occupational History   Occupation: Midwife  Tobacco Use   Smoking status: Former    Pack years: 0.00    Types: Cigarettes    Quit date: 07/10/2009    Years since quitting: 11.0   Smokeless tobacco: Never  Substance and Sexual Activity   Alcohol use: Yes    Alcohol/week: 1.0 standard drink    Types: 1 Glasses of wine per week    Comment: occ: 1-2 glasses wine every other day    Drug use: No   Sexual activity: Not on file

## 2020-08-20 ENCOUNTER — Ambulatory Visit (INDEPENDENT_AMBULATORY_CARE_PROVIDER_SITE_OTHER): Payer: BC Managed Care – PPO | Admitting: Orthopaedic Surgery

## 2020-08-20 ENCOUNTER — Encounter: Payer: Self-pay | Admitting: Orthopaedic Surgery

## 2020-08-20 DIAGNOSIS — S83242A Other tear of medial meniscus, current injury, left knee, initial encounter: Secondary | ICD-10-CM

## 2020-08-20 NOTE — Progress Notes (Signed)
   Post-Op Visit Note   Patient: Anna Glover           Date of Birth: 13-Jan-1976           MRN: [id number] Visit Date: 08/20/2020 PCP: Dolan Amen, MD   Assessment & Plan:  Chief Complaint:  Chief Complaint  Patient presents with   Left Knee - Pain   Visit Diagnoses:  1. Acute medial meniscus tear of left knee, initial encounter     Plan: Anna Glover is a 45 y.o. female following up 4 weeks status post left knee arthroscopy with partial medial meniscectomy. She has been doing well. At her last visit she had increased pain and swelling of the knee, Doppler US done the next day ruled out DVT. Today, she is not having any pain in her knee and the swelling has gone down considerably. She is not taking anything for pain. She has returned to her daily activities and is limiting time on her feet to no more than 30 minutes. She is eager to get back to exercising.   On exam her portal incisions are well healed. No signs of infection. No significant swelling or effusion. ROM 0 - >120 degrees. Stable ligamentous exam. Distal neurovascular exam intact.   Anna Glover is doing very well 1 month out from surgery. Our recommendation is that she does not return to running given the degenerative changes in her knee, but ultimately this is her choice. Otherwise she can gradually increase her activities as tolerable using pain and swelling as guide. We will order an unloader knee brace to be made for her. She can wear this during activity. We will see her back as needed.   Follow-Up Instructions: Return if symptoms worsen or fail to improve.   Orders:  No orders of the defined types were placed in this encounter.  No orders of the defined types were placed in this encounter.   Imaging: No results found.  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   Thrombocytopenia (HCC) 06/03/2014   Elevated LFTs 06/03/2014   Past Medical History:  Diagnosis Date   Anxiety    Depression    Hypothyroidism    Thyroid disease     Family History  Problem Relation Age of Onset   Hypertension Mother    Hyperlipidemia Mother    Hypertension Father    Heart disease Father    Pulmonary fibrosis Father    Migraines Neg Hx     Past Surgical History:  Procedure Laterality Date   CESAREAN SECTION  02/25/2003   CESAREAN SECTION  05/01/2009   CHOLECYSTECTOMY  June 2014   Social History   Occupational History   Occupation: Midwife  Tobacco Use   Smoking status: Former    Pack years: 0.00    Types: Cigarettes    Quit date: 07/10/2009    Years since quitting: 11.1   Smokeless tobacco: Never  Substance and Sexual Activity   Alcohol use: Yes    Alcohol/week: 1.0 standard drink    Types: 1 Glasses of wine per week    Comment: occ: 1-2 glasses wine every other day    Drug use: No   Sexual activity: Not on file

## 2020-11-10 ENCOUNTER — Other Ambulatory Visit: Payer: Self-pay

## 2020-11-10 ENCOUNTER — Encounter: Payer: Self-pay | Admitting: Orthopaedic Surgery

## 2020-11-10 ENCOUNTER — Ambulatory Visit: Payer: BC Managed Care – PPO | Admitting: Orthopaedic Surgery

## 2020-11-10 DIAGNOSIS — G8929 Other chronic pain: Secondary | ICD-10-CM

## 2020-11-10 DIAGNOSIS — M25562 Pain in left knee: Secondary | ICD-10-CM

## 2020-11-10 MED ORDER — METHYLPREDNISOLONE ACETATE 40 MG/ML IJ SUSP
40.0000 mg | INTRAMUSCULAR | Status: AC | PRN
  Administered 2020-11-10: 40 mg via INTRA_ARTICULAR

## 2020-11-10 MED ORDER — LIDOCAINE HCL 1 % IJ SOLN
2.0000 mL | INTRAMUSCULAR | Status: AC | PRN
  Administered 2020-11-10: 2 mL

## 2020-11-10 MED ORDER — BUPIVACAINE HCL 0.25 % IJ SOLN
2.0000 mL | INTRAMUSCULAR | Status: AC | PRN
  Administered 2020-11-10: 2 mL via INTRA_ARTICULAR

## 2020-11-10 NOTE — Progress Notes (Signed)
Office Visit Note   Patient: Anna Glover           Date of Birth: 1975-11-28           MRN: [id number] Visit Date: 11/10/2020              Requested by: Dolan Amen, MD [address] [place] [place],  [place] [address] PCP: Courtney Paris, NP   Assessment & Plan: Visit Diagnoses:  1. Chronic pain of left knee     Plan: Impression is left knee reactive synovitis.  We have discussed cortisone injection today for which she would like to proceed.  We have also discussed repeat viscosupplementation injection should the cortisone not alleviate her symptoms.  She will call and let us know.  Otherwise follow-up with [place] as needed.  Follow-Up Instructions: Return if symptoms worsen or fail to improve.   Orders:  Orders Placed This Encounter  Procedures   Large Joint Inj    No orders of the defined types were placed in this encounter.     Procedures: Large Joint Inj: L knee on 11/10/2020 3:43 PM Indications: pain Details: 22 G needle, anterolateral approach Medications: 2 mL lidocaine 1 %; 2 mL bupivacaine 0.25 %; 40 mg methylPREDNISolone acetate 40 MG/ML     Clinical Data: No additional findings.   Subjective: Chief Complaint  Patient presents with   Left Knee - Pain, Follow-up    HPI patient is a pleasant 45 year old female who comes in today with continued left knee pain.  She is status post left knee debriding arthroscopy medial meniscus and chondroplasty from a few months ago.  It was noted during operative imaging moderate medial and patellofemoral degenerative changes she has had continued pain to the popliteal fossa since surgery.  Worse with extremes of flexion and extension.  She has occasional pain with walking and standing.  She tried wearing a knee brace but the strap comes across the posterior knee which is very aggravating.  She has not had a cortisone injection since her recent surgery.  Review of Systems as detailed in HPI.  All others reviewed and are  negative.   Objective: Vital Signs: There were no vitals taken for this visit.  Physical Exam well-developed well-nourished female in no acute distress.  Alert and oriented x3.  Ortho Exam left knee exam shows trace effusion.  Range of motion 0 to 120 degrees.  Moderate tenderness to popliteal fossa.  No calf pain.  No joint line pain.  She is neurovascular tact distally.  Specialty Comments:  No specialty comments available.  Imaging: No new imaging   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   Thrombocytopenia ([facility]) 06/03/2014   Elevated LFTs 06/03/2014   Past Medical History:  Diagnosis Date   Anxiety    Depression    Hypothyroidism    Thyroid disease     Family History  Problem Relation Age of Onset   Hypertension Mother    Hyperlipidemia Mother    Hypertension Father    Heart disease Father    Pulmonary fibrosis Father    Migraines Neg Hx     Past Surgical History:  Procedure Laterality Date   CESAREAN SECTION  02/25/2003   CESAREAN SECTION  05/01/2009   CHOLECYSTECTOMY  June 2014   Social History   Occupational History   Occupation: Midwife  Tobacco Use   Smoking status: Former    Types: Cigarettes    Quit date: 07/10/2009    Years since quitting: 11.3   Smokeless tobacco: Never  Substance and Sexual Activity   Alcohol use: Yes    Alcohol/week: 1.0 standard drink    Types: 1 Glasses of wine per week    Comment: occ: 1-2 glasses wine every other day    Drug use: No   Sexual activity: Not on file

## 2021-01-02 ENCOUNTER — Encounter (HOSPITAL_BASED_OUTPATIENT_CLINIC_OR_DEPARTMENT_OTHER): Payer: Self-pay | Admitting: *Deleted

## 2021-01-02 ENCOUNTER — Emergency Department (HOSPITAL_BASED_OUTPATIENT_CLINIC_OR_DEPARTMENT_OTHER): Payer: BC Managed Care – PPO

## 2021-01-02 ENCOUNTER — Observation Stay (HOSPITAL_BASED_OUTPATIENT_CLINIC_OR_DEPARTMENT_OTHER)
Admission: EM | Admit: 2021-01-02 | Discharge: 2021-01-04 | Disposition: A | Discharge status: Home / Self Care | Payer: BC Managed Care – PPO | Attending: Gastroenterology | Admitting: Gastroenterology

## 2021-01-02 ENCOUNTER — Other Ambulatory Visit: Payer: Self-pay

## 2021-01-02 DIAGNOSIS — F1020 Alcohol dependence, uncomplicated: Secondary | ICD-10-CM | POA: Diagnosis not present

## 2021-01-02 DIAGNOSIS — K2970 Gastritis, unspecified, without bleeding: Secondary | ICD-10-CM

## 2021-01-02 DIAGNOSIS — E039 Hypothyroidism, unspecified: Secondary | ICD-10-CM | POA: Diagnosis present

## 2021-01-02 DIAGNOSIS — K3189 Other diseases of stomach and duodenum: Secondary | ICD-10-CM | POA: Insufficient documentation

## 2021-01-02 DIAGNOSIS — Z87891 Personal history of nicotine dependence: Secondary | ICD-10-CM | POA: Diagnosis not present

## 2021-01-02 DIAGNOSIS — Z20822 Contact with and (suspected) exposure to covid-19: Secondary | ICD-10-CM | POA: Diagnosis not present

## 2021-01-02 DIAGNOSIS — I10 Essential (primary) hypertension: Secondary | ICD-10-CM | POA: Diagnosis present

## 2021-01-02 DIAGNOSIS — K920 Hematemesis: Secondary | ICD-10-CM | POA: Diagnosis present

## 2021-01-02 DIAGNOSIS — Z79899 Other long term (current) drug therapy: Secondary | ICD-10-CM | POA: Diagnosis not present

## 2021-01-02 DIAGNOSIS — R1011 Right upper quadrant pain: Secondary | ICD-10-CM | POA: Diagnosis present

## 2021-01-02 DIAGNOSIS — K449 Diaphragmatic hernia without obstruction or gangrene: Secondary | ICD-10-CM | POA: Insufficient documentation

## 2021-01-02 DIAGNOSIS — K2100 Gastro-esophageal reflux disease with esophagitis, without bleeding: Secondary | ICD-10-CM | POA: Diagnosis not present

## 2021-01-02 DIAGNOSIS — Z9049 Acquired absence of other specified parts of digestive tract: Secondary | ICD-10-CM | POA: Insufficient documentation

## 2021-01-02 DIAGNOSIS — K209 Esophagitis, unspecified without bleeding: Secondary | ICD-10-CM

## 2021-01-02 DIAGNOSIS — F418 Other specified anxiety disorders: Secondary | ICD-10-CM | POA: Diagnosis present

## 2021-01-02 HISTORY — DX: Essential (primary) hypertension: I10

## 2021-01-02 LAB — PREGNANCY, URINE: Preg Test, Ur: NEGATIVE

## 2021-01-02 LAB — CBC WITH DIFFERENTIAL/PLATELET
Abs Immature Granulocytes: 0.03 10*3/uL (ref 0.00–0.07)
Basophils Absolute: 0 10*3/uL (ref 0.0–0.1)
Basophils Relative: 1 %
Eosinophils Absolute: 0.1 10*3/uL (ref 0.0–0.5)
Eosinophils Relative: 1 %
HCT: 44.2 % (ref 36.0–46.0)
Hemoglobin: 15.1 g/dL — ABNORMAL HIGH (ref 12.0–15.0)
Immature Granulocytes: 0 %
Lymphocytes Relative: 18 %
Lymphs Abs: 1.4 10*3/uL (ref 0.7–4.0)
MCH: 32.2 pg (ref 26.0–34.0)
MCHC: 34.2 g/dL (ref 30.0–36.0)
MCV: 94.2 fL (ref 80.0–100.0)
Monocytes Absolute: 0.6 10*3/uL (ref 0.1–1.0)
Monocytes Relative: 7 %
Neutro Abs: 5.7 10*3/uL (ref 1.7–7.7)
Neutrophils Relative %: 73 %
Platelets: 231 10*3/uL (ref 150–400)
RBC: 4.69 MIL/uL (ref 3.87–5.11)
RDW: 12.2 % (ref 11.5–15.5)
WBC: 7.9 10*3/uL (ref 4.0–10.5)
nRBC: 0 % (ref 0.0–0.2)

## 2021-01-02 LAB — URINALYSIS, ROUTINE W REFLEX MICROSCOPIC
Bilirubin Urine: NEGATIVE
Glucose, UA: NEGATIVE mg/dL
Hgb urine dipstick: NEGATIVE
Ketones, ur: NEGATIVE mg/dL
Leukocytes,Ua: NEGATIVE
Nitrite: NEGATIVE
Specific Gravity, Urine: 1.017 (ref 1.005–1.030)
pH: 7.5 (ref 5.0–8.0)

## 2021-01-02 LAB — RESP PANEL BY RT-PCR (FLU A&B, COVID) ARPGX2
Influenza A by PCR: NEGATIVE
Influenza B by PCR: NEGATIVE
SARS Coronavirus 2 by RT PCR: NEGATIVE

## 2021-01-02 LAB — PROTIME-INR
INR: 0.9 (ref 0.8–1.2)
Prothrombin Time: 12.5 seconds (ref 11.4–15.2)

## 2021-01-02 LAB — COMPREHENSIVE METABOLIC PANEL
ALT: 20 U/L (ref 0–44)
AST: 20 U/L (ref 15–41)
Albumin: 4.7 g/dL (ref 3.5–5.0)
Alkaline Phosphatase: 73 U/L (ref 38–126)
Anion gap: 12 (ref 5–15)
BUN: 11 mg/dL (ref 6–20)
CO2: 23 mmol/L (ref 22–32)
Calcium: 9.7 mg/dL (ref 8.9–10.3)
Chloride: 103 mmol/L (ref 98–111)
Creatinine, Ser: 0.83 mg/dL (ref 0.44–1.00)
GFR, Estimated: >60 mL/min (ref >60)
Glucose, Bld: 99 mg/dL (ref 70–99)
Potassium: 3.9 mmol/L (ref 3.5–5.1)
Sodium: 138 mmol/L (ref 135–145)
Total Bilirubin: 0.7 mg/dL (ref 0.3–1.2)
Total Protein: 7.9 g/dL (ref 6.5–8.1)

## 2021-01-02 LAB — LIPASE, BLOOD: Lipase: 35 U/L (ref 11–51)

## 2021-01-02 MED ORDER — IOHEXOL 350 MG/ML SOLN
100.0000 mL | Freq: Once | INTRAVENOUS | Status: AC | PRN
  Administered 2021-01-02: 100 mL via INTRAVENOUS

## 2021-01-02 MED ORDER — ALUM & MAG HYDROXIDE-SIMETH 200-200-20 MG/5ML PO SUSP
30.0000 mL | Freq: Once | ORAL | Status: AC
  Administered 2021-01-02: 30 mL via ORAL
  Filled 2021-01-02: qty 30

## 2021-01-02 MED ORDER — ONDANSETRON HCL 4 MG/2ML IJ SOLN
4.0000 mg | Freq: Once | INTRAMUSCULAR | Status: AC
  Administered 2021-01-02: 4 mg via INTRAVENOUS
  Filled 2021-01-02: qty 2

## 2021-01-02 MED ORDER — MORPHINE SULFATE (PF) 2 MG/ML IV SOLN
2.0000 mg | Freq: Once | INTRAVENOUS | Status: AC
  Administered 2021-01-02: 2 mg via INTRAVENOUS
  Filled 2021-01-02: qty 1

## 2021-01-02 MED ORDER — PANTOPRAZOLE SODIUM 40 MG IV SOLR
40.0000 mg | Freq: Once | INTRAVENOUS | Status: AC
  Administered 2021-01-02: 40 mg via INTRAVENOUS
  Filled 2021-01-02: qty 40

## 2021-01-02 MED ORDER — LACTATED RINGERS IV BOLUS
1000.0000 mL | Freq: Once | INTRAVENOUS | Status: AC
  Administered 2021-01-02: 1000 mL via INTRAVENOUS

## 2021-01-02 MED ORDER — MORPHINE SULFATE (PF) 4 MG/ML IV SOLN
4.0000 mg | Freq: Once | INTRAVENOUS | Status: AC
  Administered 2021-01-02: 4 mg via INTRAVENOUS
  Filled 2021-01-02: qty 1

## 2021-01-02 NOTE — ED Triage Notes (Signed)
Woke up at 3 am with right upper quadrant pain.  Vomited x 2 and diarrhea.

## 2021-01-02 NOTE — ED Provider Notes (Signed)
[facility] EMERGENCY DEPT Provider Note   CSN: [id number] Arrival date & time: 01/02/21  1429     History Chief Complaint  Patient presents with   Abdominal Pain   Emesis   Diarrhea    Anna Glover is a 45 y.o. female with PMH prior cholecystectomy who presents the emergency department for evaluation of abdominal pain.  Patient states that she was awoken from sleep at approximately 2 AM this morning with colicky right upper quadrant abdominal pain.  She states that the pain is progressively worsened over the course the day with coffee-ground emesis and clots seen in her vomit.  She endorses worsening pain with eating but denies fever, chest pain, shortness of breath, dysuria, increased frequency or any other systemic symptoms.  Patient states that over the last year she has had a bottle of wine daily.  Last drink last night.   Abdominal Pain Associated symptoms: diarrhea and vomiting   Associated symptoms: no chest pain, no chills, no cough, no dysuria, no fever, no hematuria, no shortness of breath and no sore throat   Emesis Associated symptoms: abdominal pain and diarrhea   Associated symptoms: no arthralgias, no chills, no cough, no fever and no sore throat   Diarrhea Associated symptoms: abdominal pain and vomiting   Associated symptoms: no arthralgias, no chills and no fever       Past Medical History:  Diagnosis Date   Anxiety    Depression    Hypothyroidism    Thyroid disease     Patient Active Problem List   Diagnosis Date Noted   Hematemesis 01/02/2021   Acute medial meniscus tear of left knee 06/30/2020   Primary osteoarthritis of right knee 03/31/2020   Primary osteoarthritis of left knee 03/31/2020   Headache 06/03/2014   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   Thrombocytopenia ([facility]) 06/03/2014   Elevated LFTs 06/03/2014    Past Surgical History:  Procedure Laterality Date   CESAREAN SECTION  02/25/2003   CESAREAN SECTION   05/01/2009   CHOLECYSTECTOMY  June 2014     OB History   No obstetric history on file.     Family History  Problem Relation Age of Onset   Hypertension Mother    Hyperlipidemia Mother    Hypertension Father    Heart disease Father    Pulmonary fibrosis Father    Migraines Neg Hx     Social History   Tobacco Use   Smoking status: Former    Types: Cigarettes    Quit date: 07/10/2009    Years since quitting: 11.4   Smokeless tobacco: Never  Vaping Use   Vaping Use: Never used  Substance Use Topics   Alcohol use: Yes    Alcohol/week: 1.0 standard drink    Types: 1 Glasses of wine per week    Comment: occ: 1-2 glasses wine every other day    Drug use: No    Home Medications Prior to Admission medications   Medication Sig Start Date End Date Taking? Authorizing Provider  celecoxib (CELEBREX) 200 MG capsule TAKE 1 CAPSULE BY MOUTH AS NEEDED 08/13/18   Aundra Dubin, PA-C  DiphenhydrAMINE HCl (BENADRYL ALLERGY PO) Take 2 tablets by mouth at bedtime.    [provider]  doxycycline (VIBRA-TABS) 100 MG tablet Take 100 mg by mouth 2 (two) times daily.    [provider]  HYDROcodone-acetaminophen (NORCO) 5-325 MG tablet Take 1-2 tablets by mouth 3 (three) times daily as needed. 07/20/20   Venida Jarvis,  Mary L, PA-C  ibuprofen (ADVIL,MOTRIN) 200 MG tablet Take 200 mg by mouth every 6 (six) hours as needed for mild pain.    [provider]  LESSINA-28 0.1-20 MG-MCG tablet  02/25/16   [provider]  levothyroxine (SYNTHROID, LEVOTHROID) 137 MCG tablet Take 137 mcg by mouth daily.    [provider]  Loratadine 10 MG CAPS Take 10 mg by mouth daily.     [provider]  methylPREDNISolone (MEDROL DOSEPAK) 4 MG TBPK tablet Use as instructed 06/04/14   Ghimire, Werner Lean, MD  methylPREDNISolone (MEDROL DOSEPAK) 4 MG TBPK tablet Take all together in the morning daily each day 06/10/14   Anson Fret, MD  ondansetron (ZOFRAN) 4 MG  tablet Take 1 tablet (4 mg total) by mouth every 8 (eight) hours as needed for nausea or vomiting. 07/20/20   Cristie Hem, PA-C  oxyCODONE (ROXICODONE) 5 MG immediate release tablet Take 1 tablet (5 mg total) by mouth every 6 (six) hours as needed for severe pain. 06/04/14   Ghimire, Werner Lean, MD  sertraline (ZOLOFT) 50 MG tablet Take 50 mg by mouth daily.    [provider]    Allergies    Cefdinir, Elemental sulfur, Erythromycin, and Penicillins  Review of Systems   Review of Systems  Constitutional:  Negative for chills and fever.  HENT:  Negative for ear pain and sore throat.   Eyes:  Negative for pain and visual disturbance.  Respiratory:  Negative for cough and shortness of breath.   Cardiovascular:  Negative for chest pain and palpitations.  Gastrointestinal:  Positive for abdominal pain, diarrhea and vomiting.       Hematemesis  Genitourinary:  Negative for dysuria and hematuria.  Musculoskeletal:  Negative for arthralgias and back pain.  Skin:  Negative for color change and rash.  Neurological:  Negative for seizures and syncope.  All other systems reviewed and are negative.  Physical Exam Updated Vital Signs BP (!) 150/97   Pulse (!) 52   Temp 98.2 F (36.8 C)   Resp 16   Ht 6' (1.829 m)   Wt 95.3 kg   SpO2 95%   BMI 28.48 kg/m   Physical Exam Vitals and nursing note reviewed.  Constitutional:      General: She is not in acute distress.    Appearance: She is well-developed.  HENT:     Head: Normocephalic and atraumatic.  Eyes:     Conjunctiva/sclera: Conjunctivae normal.  Cardiovascular:     Rate and Rhythm: Normal rate and regular rhythm.     Heart sounds: No murmur heard. Pulmonary:     Effort: Pulmonary effort is normal. No respiratory distress.     Breath sounds: Normal breath sounds.  Abdominal:     Palpations: Abdomen is soft.     Tenderness: There is abdominal tenderness in the right upper quadrant and epigastric area.   Musculoskeletal:        General: No swelling.     Cervical back: Neck supple.  Skin:    General: Skin is warm and dry.     Capillary Refill: Capillary refill takes less than 2 seconds.  Neurological:     Mental Status: She is alert.  Psychiatric:        Mood and Affect: Mood normal.    ED Results / Procedures / Treatments   Labs (all labs ordered are listed, but only abnormal results are displayed) Labs Reviewed  CBC WITH DIFFERENTIAL/PLATELET - Abnormal; Notable for  the following components:      Result Value   Hemoglobin 15.1 (*)    All other components within normal limits  URINALYSIS, ROUTINE W REFLEX MICROSCOPIC - Abnormal; Notable for the following components:   APPearance HAZY (*)    Protein, ur TRACE (*)    All other components within normal limits  RESP PANEL BY RT-PCR (FLU A&B, COVID) ARPGX2  COMPREHENSIVE METABOLIC PANEL  LIPASE, BLOOD  PREGNANCY, URINE  PROTIME-INR    EKG None  Radiology CT Angio Abd/Pel W and/or Wo Contrast  Result Date: 01/02/2021 CLINICAL DATA:  GI bleed, right upper quadrant abdominal pain, vomiting EXAM: CTA ABDOMEN AND PELVIS WITHOUT AND WITH CONTRAST TECHNIQUE: Multidetector CT imaging of the abdomen and pelvis was performed using the standard protocol during bolus administration of intravenous contrast. Multiplanar reconstructed images and MIPs were obtained and reviewed to evaluate the vascular anatomy. CONTRAST:  155mL OMNIPAQUE IOHEXOL 350 MG/ML SOLN COMPARISON:  None. FINDINGS: VASCULAR Aorta: Normal Celiac: Unremarkable SMA: Unremarkable Renals: Normal IMA: Normal Inflow: Unremarkable Proximal Outflow: Unremarkable Veins: No significant abnormality is seen Review of the MIP images confirms the above findings. NON-VASCULAR Lower chest: Unremarkable. Hepatobiliary: Liver measures 20.8 cm in length. There is fatty infiltration. Surgical clips are seen in gallbladder fossa. Prominence of common bile duct may be due to previous  cholecystectomy. There is no dilation of intrahepatic bile ducts. Pancreas: No focal abnormality is seen. Spleen: Unremarkable. Adrenals/Urinary Tract: Adrenals are not enlarged. There is no hydronephrosis. There are no renal or ureteral stones. Urinary bladder is not distended. Stomach/Bowel: Stomach is not distended. Small bowel loops are not dilated. Appendix is not dilated. There is no wall thickening in the colon. There are no enhancing lesions in the bowel wall. There is no demonstrable extravasation of contrast in the arterial and portal venous phase images. Lymphatic: No significant lymphadenopathy seen. Reproductive: Unremarkable. Other: There is no ascites or pneumoperitoneum. Musculoskeletal: Unremarkable. IMPRESSION: VASCULAR There is no evidence of aneurysmal dilation or stenosis in the aorta and its major branches. There is no demonstrable active extravasation of contrast. There are no focal enhancing lesions in the bowel wall. NON-VASCULAR Enlarged fatty liver. Status post cholecystectomy. There is no evidence of intestinal obstruction or pneumoperitoneum. There is no hydronephrosis. Electronically Signed   By: Elmer Picker M.D.   On: 01/02/2021 17:36   US Abdomen Limited RUQ (LIVER/GB)  Result Date: 01/02/2021 CLINICAL DATA:  Epigastric pain.  History of cholecystectomy EXAM: ULTRASOUND ABDOMEN LIMITED RIGHT UPPER QUADRANT COMPARISON:  Same day CT FINDINGS: Gallbladder: Surgically absent. 1 cm echogenic, shadowing structure seen in the gallbladder fossa likely reflecting cholecystectomy clips. Common bile duct: Diameter: 12 mm. Liver: No focal lesion identified. Mildly increased hepatic parenchymal echogenicity. Portal vein is patent on color Doppler imaging with normal direction of blood flow towards the liver. Other: None. IMPRESSION: 1. The echogenicity of the liver is increased. This is a nonspecific finding but is most commonly seen with fatty infiltration of the liver. There are no  obvious focal liver lesions. 2. Prior cholecystectomy. Electronically Signed   By: Davina Poke D.O.   On: 01/02/2021 19:35    Procedures Procedures   Medications Ordered in ED Medications  morphine 4 MG/ML injection 4 mg (4 mg Intravenous Given 01/02/21 1604)  ondansetron (ZOFRAN) injection 4 mg (4 mg Intravenous Given 01/02/21 1602)  pantoprazole (PROTONIX) injection 40 mg (40 mg Intravenous Given 01/02/21 1606)  lactated ringers bolus 1,000 mL (0 mLs Intravenous Stopped 01/02/21 1747)  iohexol (OMNIPAQUE) 350  MG/ML injection 100 mL (100 mLs Intravenous Contrast Given 01/02/21 1651)  alum & mag hydroxide-simeth (MAALOX/MYLANTA) 200-200-20 MG/5ML suspension 30 mL (30 mLs Oral Given 01/02/21 1803)  morphine 2 MG/ML injection 2 mg (2 mg Intravenous Given 01/02/21 1929)  morphine 2 MG/ML injection 2 mg (2 mg Intravenous Given 01/02/21 1959)    ED Course  I have reviewed the triage vital signs and the nursing notes.  Pertinent labs & imaging results that were available during my care of the patient were reviewed by me and considered in my medical decision making (see chart for details).  Clinical Course as of 01/02/21 2306  Sat Jan 02, 2021  1759 PPI BID [MK]    Clinical Course User Index [MK] Teressa Lower, MD   MDM Rules/Calculators/A&P                           Patient seen emergency department for evaluation of abdominal pain and hematemesis.  Physical exam reveals epigastric/right upper quadrant tenderness to palpation but is otherwise unremarkable.  Laboratory evaluation is unremarkable.  COVID and flu negative.  CT angio abdomen pelvis ordered in the setting of new active hematemesis which was reassuringly negative for acute vascular or nonvascular pathology.  Right upper quadrant ultrasound was ordered to evaluate for common bile duct diameter versus developing cirrhosis which did not show evidence of either.  On reevaluation, patient is having persistent pain breaking  through multiple doses of morphine and appears to be colicky limited to the right upper quadrant.  I suspect the patient's symptoms are likely due to biliary dyskinesia versus alcoholic gastritis versus developing pancreatitis.  The patient has a high rate of bounce back due to uncontrolled abdominal pain with persistent nausea and she will be admitted for GI to evaluate the patient tomorrow as well as a possible HIDA scan.  HIDA scan was ordered and patient was admitted to medicine. Final Clinical Impression(s) / ED Diagnoses Final diagnoses:  RUQ pain    Rx / DC Orders ED Discharge Orders     None        Narya Beavin, Debe Coder, MD 01/02/21 2308

## 2021-01-03 ENCOUNTER — Encounter (HOSPITAL_COMMUNITY): Payer: Self-pay | Admitting: Internal Medicine

## 2021-01-03 DIAGNOSIS — F418 Other specified anxiety disorders: Secondary | ICD-10-CM

## 2021-01-03 DIAGNOSIS — Z87891 Personal history of nicotine dependence: Secondary | ICD-10-CM | POA: Diagnosis not present

## 2021-01-03 DIAGNOSIS — K449 Diaphragmatic hernia without obstruction or gangrene: Secondary | ICD-10-CM | POA: Diagnosis not present

## 2021-01-03 DIAGNOSIS — E039 Hypothyroidism, unspecified: Secondary | ICD-10-CM | POA: Diagnosis not present

## 2021-01-03 DIAGNOSIS — I10 Essential (primary) hypertension: Secondary | ICD-10-CM

## 2021-01-03 DIAGNOSIS — K920 Hematemesis: Secondary | ICD-10-CM

## 2021-01-03 DIAGNOSIS — Z79899 Other long term (current) drug therapy: Secondary | ICD-10-CM | POA: Diagnosis not present

## 2021-01-03 DIAGNOSIS — F1020 Alcohol dependence, uncomplicated: Secondary | ICD-10-CM

## 2021-01-03 DIAGNOSIS — K3189 Other diseases of stomach and duodenum: Secondary | ICD-10-CM | POA: Diagnosis not present

## 2021-01-03 DIAGNOSIS — R1011 Right upper quadrant pain: Secondary | ICD-10-CM | POA: Diagnosis present

## 2021-01-03 DIAGNOSIS — Z20822 Contact with and (suspected) exposure to covid-19: Secondary | ICD-10-CM | POA: Diagnosis not present

## 2021-01-03 DIAGNOSIS — K2100 Gastro-esophageal reflux disease with esophagitis, without bleeding: Secondary | ICD-10-CM | POA: Diagnosis not present

## 2021-01-03 DIAGNOSIS — Z9049 Acquired absence of other specified parts of digestive tract: Secondary | ICD-10-CM | POA: Diagnosis not present

## 2021-01-03 LAB — CBC
HCT: 43 % (ref 36.0–46.0)
HCT: 40.5 % (ref 36.0–46.0)
Hemoglobin: 14.2 g/dL (ref 12.0–15.0)
Hemoglobin: 13.9 g/dL (ref 12.0–15.0)
MCH: 32.1 pg (ref 26.0–34.0)
MCH: 32.9 pg (ref 26.0–34.0)
MCHC: 33 g/dL (ref 30.0–36.0)
MCHC: 34.3 g/dL (ref 30.0–36.0)
MCV: 97.1 fL (ref 80.0–100.0)
MCV: 95.7 fL (ref 80.0–100.0)
Platelets: 231 10*3/uL (ref 150–400)
Platelets: 233 10*3/uL (ref 150–400)
RBC: 4.43 MIL/uL (ref 3.87–5.11)
RBC: 4.23 MIL/uL (ref 3.87–5.11)
RDW: 12.2 % (ref 11.5–15.5)
RDW: 12.2 % (ref 11.5–15.5)
WBC: 5.1 10*3/uL (ref 4.0–10.5)
WBC: 5.8 10*3/uL (ref 4.0–10.5)
nRBC: 0 % (ref 0.0–0.2)
nRBC: 0 % (ref 0.0–0.2)

## 2021-01-03 LAB — COMPREHENSIVE METABOLIC PANEL
ALT: 57 U/L — ABNORMAL HIGH (ref 0–44)
AST: 50 U/L — ABNORMAL HIGH (ref 15–41)
Albumin: 3.7 g/dL (ref 3.5–5.0)
Alkaline Phosphatase: 105 U/L (ref 38–126)
Anion gap: 10 (ref 5–15)
BUN: 8 mg/dL (ref 6–20)
CO2: 25 mmol/L (ref 22–32)
Calcium: 9.1 mg/dL (ref 8.9–10.3)
Chloride: 102 mmol/L (ref 98–111)
Creatinine, Ser: 0.93 mg/dL (ref 0.44–1.00)
GFR, Estimated: >60 mL/min (ref >60)
Glucose, Bld: 94 mg/dL (ref 70–99)
Potassium: 3.8 mmol/L (ref 3.5–5.1)
Sodium: 137 mmol/L (ref 135–145)
Total Bilirubin: 0.9 mg/dL (ref 0.3–1.2)
Total Protein: 6.8 g/dL (ref 6.5–8.1)

## 2021-01-03 LAB — HIV ANTIBODY (ROUTINE TESTING W REFLEX): HIV Screen 4th Generation wRfx: NONREACTIVE

## 2021-01-03 MED ORDER — HYDRALAZINE HCL 20 MG/ML IJ SOLN: 5.0000 mg | INTRAMUSCULAR | Status: DC | PRN

## 2021-01-03 MED ORDER — THIAMINE HCL 100 MG/ML IJ SOLN: 100.0000 mg | Freq: Every day | INTRAMUSCULAR | Status: DC

## 2021-01-03 MED ORDER — ADULT MULTIVITAMIN W/MINERALS CH
1.0000 | ORAL_TABLET | Freq: Every day | ORAL | Status: DC
  Administered 2021-01-03 – 2021-01-04 (×2): 1 via ORAL
  Filled 2021-01-03 (×2): qty 1

## 2021-01-03 MED ORDER — ONDANSETRON HCL 4 MG/2ML IJ SOLN: 4.0000 mg | Freq: Four times a day (QID) | INTRAMUSCULAR | Status: DC | PRN

## 2021-01-03 MED ORDER — MORPHINE SULFATE (PF) 2 MG/ML IV SOLN: 2.0000 mg | INTRAVENOUS | Status: DC | PRN

## 2021-01-03 MED ORDER — LORAZEPAM 2 MG/ML IJ SOLN: 1.0000 mg | INTRAMUSCULAR | Status: DC | PRN

## 2021-01-03 MED ORDER — LEVOTHYROXINE SODIUM 25 MCG PO TABS
137.0000 ug | ORAL_TABLET | Freq: Every day | ORAL | Status: DC
  Administered 2021-01-04: 137 ug via ORAL
  Filled 2021-01-03: qty 1

## 2021-01-03 MED ORDER — FOLIC ACID 1 MG PO TABS
1.0000 mg | ORAL_TABLET | Freq: Every day | ORAL | Status: DC
  Administered 2021-01-03 – 2021-01-04 (×2): 1 mg via ORAL
  Filled 2021-01-03 (×2): qty 1

## 2021-01-03 MED ORDER — LORAZEPAM 1 MG PO TABS: 1.0000 mg | ORAL_TABLET | ORAL | Status: DC | PRN

## 2021-01-03 MED ORDER — SERTRALINE HCL 50 MG PO TABS
50.0000 mg | ORAL_TABLET | Freq: Every day | ORAL | Status: DC
  Administered 2021-01-03: 50 mg via ORAL
  Filled 2021-01-03: qty 1

## 2021-01-03 MED ORDER — THIAMINE HCL 100 MG PO TABS
100.0000 mg | ORAL_TABLET | Freq: Every day | ORAL | Status: DC
  Administered 2021-01-03 – 2021-01-04 (×2): 100 mg via ORAL
  Filled 2021-01-03 (×2): qty 1

## 2021-01-03 MED ORDER — LACTATED RINGERS IV SOLN
INTRAVENOUS | Status: DC
  Administered 2021-01-04: 100 mL via INTRAVENOUS

## 2021-01-03 MED ORDER — AMLODIPINE BESYLATE 10 MG PO TABS
10.0000 mg | ORAL_TABLET | Freq: Every day | ORAL | Status: DC
  Administered 2021-01-03: 10 mg via ORAL
  Filled 2021-01-03: qty 1

## 2021-01-03 MED ORDER — ONDANSETRON HCL 4 MG PO TABS: 4.0000 mg | ORAL_TABLET | Freq: Four times a day (QID) | ORAL | Status: DC | PRN

## 2021-01-03 MED ORDER — PANTOPRAZOLE SODIUM 40 MG IV SOLR
40.0000 mg | Freq: Two times a day (BID) | INTRAVENOUS | Status: DC
  Administered 2021-01-03 (×2): 40 mg via INTRAVENOUS
  Filled 2021-01-03 (×2): qty 40

## 2021-01-03 MED ORDER — ACETAMINOPHEN 650 MG RE SUPP: 650.0000 mg | Freq: Four times a day (QID) | RECTAL | Status: DC | PRN

## 2021-01-03 MED ORDER — ACETAMINOPHEN 325 MG PO TABS: 650.0000 mg | ORAL_TABLET | Freq: Four times a day (QID) | ORAL | Status: DC | PRN

## 2021-01-03 MED ORDER — LORATADINE 10 MG PO TABS
10.0000 mg | ORAL_TABLET | Freq: Every day | ORAL | Status: DC
  Administered 2021-01-03: 10 mg via ORAL
  Filled 2021-01-03: qty 1

## 2021-01-03 NOTE — H&P (View-Only) (Signed)
   Consultation  Referring Provider:  Dr. Yates    Primary Care Physician:  McCoy, Rachel, NP Primary Gastroenterologist:  [facility]      Reason for Consultation:  Hematemesis, Abdominal Pain            HPI:   Anna Glover is a 45 y.o. female with a past medical history significant for alcohol dependence, depression/anxiety and hypothyroidism, who presented to the ED on 12/23/20 with upper GI bleeding.    Today, the patient explains that at around 3:00 AM she woke up with severe 10/10 epigastric abdominal pain which was "similar to when I had my gallbladder attacks".  Explains that this seemed to "ebb and flow" like contractions and was terrible feeling.  She could just not get herself comfortable and remained nauseated throughout the day.  She tried to eat a banana and drink some water but this seemed to make it worse.  Then around 130 yesterday afternoon she had some diarrhea which "is not abnormal for me I have IBS", but continued with nausea and remembered having to make her self vomit when she would have gallbladder issues, so she did the same but it did not make things any better and in fact when she vomited she saw a banana and a clear liquid fluid as well as "black specks", she vomited again about an hour later and saw some "red blotches" in the vomitus.  This is when they decided to come to the ER.    Patient tells me now that after receiving Morphine and Zofran she has had no further abdominal pain but  still feels "sensitive" in there.  She ate some saltines this morning.  Describes that she thinks she was just nauseous because of the pain.  Patient is worried in regards to a dilated bile duct seen in on imaging at [facility].  She has not had a bowel movement since yesterday morning.    Does admit to at least a bottle of wine per day over the past year and tells me "I know this is too much".    Denies fever, chills, weight loss or change in bowel habits.  ED course: [facility] to  [place] transfer  GI history: Reports seeing [facility] and being diagnosed with IBS by their gastroenterologist in the past  Past Medical History:  Diagnosis Date   Anxiety    Depression    Hypertension    Hypothyroidism     Past Surgical History:  Procedure Laterality Date   CESAREAN SECTION  02/25/2003   CESAREAN SECTION  05/01/2009   CHOLECYSTECTOMY  June 2014    Family History  Problem Relation Age of Onset   Hypertension Mother    Hyperlipidemia Mother    Hypertension Father    Heart disease Father    Pulmonary fibrosis Father    Migraines Neg Hx     Social History   Tobacco Use   Smoking status: Former    Years: 10.00    Types: Cigarettes    Quit date: 07/10/2009    Years since quitting: 11.4   Smokeless tobacco: Never  Vaping Use   Vaping Use: Never used  Substance Use Topics   Alcohol use: Yes    Alcohol/week: 1.0 standard drink    Types: 1 Glasses of wine per week    Comment: "too much" - a bottle of wine a night   Drug use: No    Prior to Admission medications   Medication   Sig Start Date End Date Taking? Authorizing Provider  celecoxib (CELEBREX) 200 MG capsule TAKE 1 CAPSULE BY MOUTH AS NEEDED 08/13/18   Cristie Hem, PA-C  DiphenhydrAMINE HCl (BENADRYL ALLERGY PO) Take 2 tablets by mouth at bedtime.    [provider]  doxycycline (VIBRA-TABS) 100 MG tablet Take 100 mg by mouth 2 (two) times daily.    [provider]  HYDROcodone-acetaminophen (NORCO) 5-325 MG tablet Take 1-2 tablets by mouth 3 (three) times daily as needed. 07/20/20   Cristie Hem, PA-C  ibuprofen (ADVIL,MOTRIN) 200 MG tablet Take 200 mg by mouth every 6 (six) hours as needed for mild pain.    [provider]  LESSINA-28 0.1-20 MG-MCG tablet  02/25/16   [provider]  levothyroxine (SYNTHROID, LEVOTHROID) 137 MCG tablet Take 137 mcg by mouth daily.    [provider]  Loratadine 10 MG CAPS Take 10 mg by mouth daily.      [provider]  methylPREDNISolone (MEDROL DOSEPAK) 4 MG TBPK tablet Use as instructed 06/04/14   Ghimire, Werner Lean, MD  methylPREDNISolone (MEDROL DOSEPAK) 4 MG TBPK tablet Take all together in the morning daily each day 06/10/14   Anson Fret, MD  ondansetron (ZOFRAN) 4 MG tablet Take 1 tablet (4 mg total) by mouth every 8 (eight) hours as needed for nausea or vomiting. 07/20/20   Cristie Hem, PA-C  oxyCODONE (ROXICODONE) 5 MG immediate release tablet Take 1 tablet (5 mg total) by mouth every 6 (six) hours as needed for severe pain. 06/04/14   Ghimire, Werner Lean, MD  sertraline (ZOLOFT) 50 MG tablet Take 50 mg by mouth daily.    [provider]    Current Facility-Administered Medications  Medication Dose Route Frequency Provider Last Rate Last Admin   acetaminophen (TYLENOL) tablet 650 mg  650 mg Oral Q6H PRN Jonah Blue, MD       Or   acetaminophen (TYLENOL) suppository 650 mg  650 mg Rectal Q6H PRN Jonah Blue, MD       folic acid (FOLVITE) tablet 1 mg  1 mg Oral Daily Jonah Blue, MD       hydrALAZINE (APRESOLINE) injection 5 mg  5 mg Intravenous Q4H PRN Jonah Blue, MD       lactated ringers infusion   Intravenous Continuous Jonah Blue, MD 100 mL/hr at 01/03/21 1109 New Bag at 01/03/21 1109   LORazepam (ATIVAN) tablet 1-4 mg  1-4 mg Oral Q1H PRN Jonah Blue, MD       Or   LORazepam (ATIVAN) injection 1-4 mg  1-4 mg Intravenous Q1H PRN Jonah Blue, MD       morphine 2 MG/ML injection 2 mg  2 mg Intravenous Q2H PRN Jonah Blue, MD       multivitamin with minerals tablet 1 tablet  1 tablet Oral Daily Jonah Blue, MD       ondansetron [facility]) tablet 4 mg  4 mg Oral Q6H PRN Jonah Blue, MD       Or   ondansetron [facility]) injection 4 mg  4 mg Intravenous Q6H PRN Jonah Blue, MD       pantoprazole (PROTONIX) injection 40 mg  40 mg Intravenous Steva Colder, MD   40 mg at 01/03/21 1109   thiamine tablet 100 mg   100 mg Oral Daily Jonah Blue, MD       Or   thiamine (B-1) injection 100 mg  100 mg Intravenous Daily Jonah Blue, MD  Allergies as of 01/02/2021 - Review Complete 01/02/2021  Allergen Reaction Noted   Cefdinir Other (See Comments) 07/10/2013   Elemental sulfur Other (See Comments) 07/10/2013   Erythromycin Nausea And Vomiting 01/17/2012   Penicillins Hives 01/17/2012     Review of Systems:    Constitutional: No weight loss, fever or chills Skin: No rash  Cardiovascular: No chest pain Respiratory: No SOB  Gastrointestinal: See HPI and otherwise negative Genitourinary: No dysuria  Neurological: No headache, dizziness or syncope Musculoskeletal: No new muscle or joint pain Hematologic: No bleeding  Psychiatric: No history of depression or anxiety    Physical Exam:  Vital signs in last 24 hours: Temp:  [98.2 F (36.8 C)-98.6 F (37 C)] 98.6 F (37 C) (11/20 0856) Pulse Rate:  [50-66] 53 (11/20 0856) Resp:  [14-19] 15 (11/20 0856) BP: (129-170)/(79-99) 129/83 (11/20 0856) SpO2:  [95 %-100 %] 95 % (11/20 0856) Weight:  [95.3 kg] 95.3 kg (11/19 1439) Last BM Date: 01/02/21 General:   Pleasant Caucasian female appears to be in NAD, Well developed, Well nourished, alert and cooperative Head:  Normocephalic and atraumatic. Eyes:   PEERL, EOMI. No icterus. Conjunctiva pink. Ears:  Normal auditory acuity. Neck:  Supple Throat: Oral cavity and pharynx without inflammation, swelling or lesion.  Lungs: Respirations even and unlabored. Lungs clear to auscultation bilaterally.   No wheezes, crackles, or rhonchi.  Heart: Normal S1, S2. No MRG. Regular rate and rhythm. No peripheral edema, cyanosis or pallor.  Abdomen:  Soft, nondistended, mild epigastric ttp, No rebound or guarding. Normal bowel sounds. No appreciable masses or hepatomegaly. Rectal:  Not performed.  Msk:  Symmetrical without gross deformities. Peripheral pulses intact.  Extremities:  Without edema, no  deformity or joint abnormality.  Neurologic:  Alert and  oriented x4;  grossly normal neurologically.  Skin:   Dry and intact without significant lesions or rashes. Psychiatric: Demonstrates good judgement and reason without abnormal affect or behaviors.  LAB RESULTS: Recent Labs    01/02/21 1446  WBC 7.9  HGB 15.1*  HCT 44.2  PLT 231   BMET Recent Labs    01/02/21 1446  NA 138  K 3.9  CL 103  CO2 23  GLUCOSE 99  BUN 11  CREATININE 0.83  CALCIUM 9.7   LFT Recent Labs    01/02/21 1446  PROT 7.9  ALBUMIN 4.7  AST 20  ALT 20  ALKPHOS 73  BILITOT 0.7   PT/INR Recent Labs    01/02/21 1601  LABPROT 12.5  INR 0.9    STUDIES: CT Angio Abd/Pel W and/or Wo Contrast  Result Date: 01/02/2021 CLINICAL DATA:  GI bleed, right upper quadrant abdominal pain, vomiting EXAM: CTA ABDOMEN AND PELVIS WITHOUT AND WITH CONTRAST TECHNIQUE: Multidetector CT imaging of the abdomen and pelvis was performed using the standard protocol during bolus administration of intravenous contrast. Multiplanar reconstructed images and MIPs were obtained and reviewed to evaluate the vascular anatomy. CONTRAST:  OMNIPAQUE IOHEXOL 350 MG/ML SOLN COMPARISON:  None. FINDINGS: VASCULAR Aorta: Normal Celiac: Unremarkable SMA: Unremarkable Renals: Normal IMA: Normal Inflow: Unremarkable Proximal Outflow: Unremarkable Veins: No significant abnormality is seen Review of the MIP images confirms the above findings. NON-VASCULAR Lower chest: Unremarkable. Hepatobiliary: Liver measures 20.8 cm in length. There is fatty infiltration. Surgical clips are seen in gallbladder fossa. Prominence of common bile duct may be due to previous cholecystectomy. There is no dilation of intrahepatic bile ducts. Pancreas: No focal abnormality is seen. Spleen: Unremarkable. Adrenals/Urinary Tract: Adrenals are not  enlarged. There is no hydronephrosis. There are no renal or ureteral stones. Urinary bladder is not distended.  Stomach/Bowel: Stomach is not distended. Small bowel loops are not dilated. Appendix is not dilated. There is no wall thickening in the colon. There are no enhancing lesions in the bowel wall. There is no demonstrable extravasation of contrast in the arterial and portal venous phase images. Lymphatic: No significant lymphadenopathy seen. Reproductive: Unremarkable. Other: There is no ascites or pneumoperitoneum. Musculoskeletal: Unremarkable. IMPRESSION: VASCULAR There is no evidence of aneurysmal dilation or stenosis in the aorta and its major branches. There is no demonstrable active extravasation of contrast. There are no focal enhancing lesions in the bowel wall. NON-VASCULAR Enlarged fatty liver. Status post cholecystectomy. There is no evidence of intestinal obstruction or pneumoperitoneum. There is no hydronephrosis. Electronically Signed   By: Ernie Avena M.D.   On: 01/02/2021 17:36   US Abdomen Limited RUQ (LIVER/GB)  Result Date: 01/02/2021 CLINICAL DATA:  Epigastric pain.  History of cholecystectomy EXAM: ULTRASOUND ABDOMEN LIMITED RIGHT UPPER QUADRANT COMPARISON:  Same day CT FINDINGS: Gallbladder: Surgically absent. 1 cm echogenic, shadowing structure seen in the gallbladder fossa likely reflecting cholecystectomy clips. Common bile duct: Diameter: 12 mm. Liver: No focal lesion identified. Mildly increased hepatic parenchymal echogenicity. Portal vein is patent on color Doppler imaging with normal direction of blood flow towards the liver. Other: None. IMPRESSION: 1. The echogenicity of the liver is increased. This is a nonspecific finding but is most commonly seen with fatty infiltration of the liver. There are no obvious focal liver lesions. 2. Prior cholecystectomy. Electronically Signed   By: Duanne Guess D.O.   On: 01/02/2021 19:35      Impression / Plan:   Impression: 1.  Epigastric abdominal pain: Likely with gastritis given alcohol abuse +/-PUD 2.  Hematemesis: 1 episode  of "black specks" and another episode of "red blotches" in her vomitus, no further episodes of vomiting since then, hemoglobin stable; likely Mallory-Weiss +/- gastritis +/- esophagitis 3.  History of IBS-D 4.  Prominence of common bile duct on CTA: This is a normal physiological response after cholecystectomy, no mention of dilation on ultrasound, liver enzymes normal 5.  Alcoholism: Patient drinks a bottle of wine per night over the past year, likely contributing to all of the above  Plan: 1.  Patient will be scheduled for an EGD tomorrow 11/21 with Dr. Orvan Falconer.  Did discuss risks, benefits, limitations and alternatives the patient agrees to proceed. 2.  Patient can be on a clear liquid diet as tolerated today and n.p.o. at midnight 3.  Continue analgesics and antiemetics 4.  Had a detailed discussion with the patient in regards to various lab testing she was inquiring about including HIDA scan, ERCP and MRI.  I answered all of her questions.  There is no need for further evaluation of "prominent" bile duct given that this is normal after a cholecystectomy. 5.  Did discuss patient's alcoholism.  She needs to abstain in the future. 6.  Discussed patient's fatty liver.  Again abstaining from alcohol would help with this as well. 7.  Agree with Pantoprazole 40 mg twice daily.  She likely needs to remain on a PPI given severe epigastric pain and alcohol use, but can make further decisions after EGD. 8.  Continue to monitor hemoglobin every 24 hours unless patient has no overt episode of GI bleeding.  Thank you for your kind consultation, we will continue to follow.  Violet Baldy [facility]  01/03/2021, 11:42 AM  ________________________________________________________________________  [facility] MD note:  I personally examined the patient, reviewed the data and agree with the assessment and plan described above. 45 yo woman s/p remote lap chole, here with epig abd pain, nausea, vomiting with  minor hematemesis.  Hb normal.  CT and [place] unrevealing except for dilated CBD which is possibly related to remote cholecystectomy, normal physiologic response.  Her LFTs were normal yeterday, AST and ALT a bit elevated today.  We are planning EGD tomorrow and if no clear explanation for her symptoms then she may need further evaluation of the biliary tree with MRCP.  Will repeat LFTs in the AM as well.   Rob Bunting, MD [facility] Pager [phone]

## 2021-01-03 NOTE — H&P (Signed)
History and Physical    Anna Glover NLG:[id number] DOB: [birth year] DOA: 01/02/2021  PCP: Courtney Paris, NP Consultants:  Roda Shutters - orthopedics Patient coming from:  Home - lives with husband, FIL, and 2 kids; NOK: Husband, [phone]  Chief Complaint: UGI bleeding  HPI: Anna Glover is a 45 y.o. female with medical history significant of ETOH dependence; depression/anxiety; and hypothyroidism presenting with UGI bleeding. At 3am Saturday AM, she awoke with severe abdominal pain.  Midepigastric pain, felt similar to prior to cholecystectomy.  She couldn't get herself comfortable.  She was nauseated throughout.  She had an episode of diarrhea yesterday afternoon.  She forced herself to vomit and noticed little black coffee ground-appearing spots.  Se vomited one more time and it was red mixed in with liquid,.  Pain subsided about 0100 today after several rounds of morphine and Zofran.  Pain was colicky, 10/10, consistent with childbirth level pain.  No prior episodes of hematemesis.  She had saltines about 0500 - it felt sore after eating but not painful.  She recognizes that she needs to stop drinking but doesn't actually think she has a problem with ETOH.  She is not feeling like she is withdrawing.  She drinks a bottle nightly.    ED Course: [facility] transfer, per Dr. Leafy Half:  45 year old female with regular heavy alcohol use presenting with 1 day history of right upper quadrant pain nausea and vomiting with 2 episodes of hematemesis.  Upon evaluation in the emergency department Case was discussed with Dr. Marca Ancona with gastroenterology.  Initially it was felt the patient might be able to go home and therefore Dr. Pati Gallo recommended a dose of intravenous proton pump inhibitor followed by a prescription for an oral PPI and outpatient EGD.  Unfortunately, patient continued to experience substantial right upper quadrant pain and therefore underwent right upper quadrant ultrasound and CT  angiogram both of which were unremarkable.  Due to patient's ongoing pain her provider is requesting hospitalization for further work-up suggesting possibly an ERCP or HIDA scan.  Due to patient's alcohol use, I suggested the patient be placed on CIWA protocol and since we are monitoring for alcohol withdrawal patient will be placed in a medical telemetry bed at [facility] (no [facility] med tele beds available).  ER provider sent a message to [facility] gastroenterology requesting consultation for tomorrow due to the hematemesis.   Review of Systems: As per HPI; otherwise review of systems reviewed and negative.   Ambulatory Status:  Ambulates without assistance  COVID Vaccine Status:   Complete plus boosters  Past Medical History:  Diagnosis Date   Anxiety    Depression    Hypertension    Hypothyroidism     Past Surgical History:  Procedure Laterality Date   CESAREAN SECTION  02/25/2003   CESAREAN SECTION  05/01/2009   CHOLECYSTECTOMY  June 2014    Social History   Socioeconomic History   Marital status: Married    Spouse name: Teacher, music    Number of children: 2   Years of education: Masters    Highest education level: Not on file  Occupational History   Occupation: Midwife  Tobacco Use   Smoking status: Former    Years: 10.00    Types: Cigarettes    Quit date: 07/10/2009    Years since quitting: 11.4   Smokeless tobacco: Never  Vaping Use   Vaping Use: Never used  Substance and Sexual Activity   Alcohol use: Yes    Alcohol/week:  1.0 standard drink    Types: 1 Glasses of wine per week    Comment: "too much" - a bottle of wine a night   Drug use: No   Sexual activity: Not on file  Other Topics Concern   Not on file  Social History Narrative   Lives at home with husband   Caffeine use: Drinks coffee every day    Social Determinants of Health   Financial Resource Strain: Not on file  Food Insecurity: Not on file  Transportation Needs: Not on file  Physical  Activity: Not on file  Stress: Not on file  Social Connections: Not on file  Intimate Partner Violence: Not on file    Allergies  Allergen Reactions   Cefdinir Other (See Comments)    Rectal bleeding   Elemental Sulfur Other (See Comments)    Rectal bleeding   Erythromycin Nausea And Vomiting   Penicillins Hives    Family History  Problem Relation Age of Onset   Hypertension Mother    Hyperlipidemia Mother    Hypertension Father    Heart disease Father    Pulmonary fibrosis Father    Migraines Neg Hx     Prior to Admission medications   Medication Sig Start Date End Date Taking? Authorizing Provider  celecoxib (CELEBREX) 200 MG capsule TAKE 1 CAPSULE BY MOUTH AS NEEDED 08/13/18   Cristie Hem, PA-C  DiphenhydrAMINE HCl (BENADRYL ALLERGY PO) Take 2 tablets by mouth at bedtime.    [provider]  doxycycline (VIBRA-TABS) 100 MG tablet Take 100 mg by mouth 2 (two) times daily.    [provider]  HYDROcodone-acetaminophen (NORCO) 5-325 MG tablet Take 1-2 tablets by mouth 3 (three) times daily as needed. 07/20/20   Cristie Hem, PA-C  ibuprofen (ADVIL,MOTRIN) 200 MG tablet Take 200 mg by mouth every 6 (six) hours as needed for mild pain.    [provider]  LESSINA-28 0.1-20 MG-MCG tablet  02/25/16   [provider]  levothyroxine (SYNTHROID, LEVOTHROID) 137 MCG tablet Take 137 mcg by mouth daily.    [provider]  Loratadine 10 MG CAPS Take 10 mg by mouth daily.     [provider]  methylPREDNISolone (MEDROL DOSEPAK) 4 MG TBPK tablet Use as instructed 06/04/14   Ghimire, Werner Lean, MD  methylPREDNISolone (MEDROL DOSEPAK) 4 MG TBPK tablet Take all together in the morning daily each day 06/10/14   Anson Fret, MD  ondansetron (ZOFRAN) 4 MG tablet Take 1 tablet (4 mg total) by mouth every 8 (eight) hours as needed for nausea or vomiting. 07/20/20   Cristie Hem, PA-C  oxyCODONE (ROXICODONE) 5 MG immediate release  tablet Take 1 tablet (5 mg total) by mouth every 6 (six) hours as needed for severe pain. 06/04/14   Ghimire, Werner Lean, MD  sertraline (ZOLOFT) 50 MG tablet Take 50 mg by mouth daily.    [provider]    Physical Exam: Vitals:   01/03/21 0030 01/03/21 0330 01/03/21 0500 01/03/21 0856  BP: (!) 151/96 131/79 (!) 137/98 129/83  Pulse: (!) 50 (!) 50 (!) 55 (!) 53  Resp: 18 19 14 15   Temp:    98.6 F (37 C)  TempSrc:    Oral  SpO2: 95% 96% 95% 95%  Weight:      Height:         General:  Appears calm and comfortable and is in NAD Eyes:   EOMI, normal lids, iris ENT:  grossly  normal hearing, lips & tongue, mmm Neck:  no LAD, masses or thyromegaly Cardiovascular:  RRR, no m/r/g. No LE edema.  Respiratory:   CTA bilaterally with no wheezes/rales/rhonchi.  Normal respiratory effort. Abdomen:  soft, very mild epigastric TTP, ND Skin:  no rash or induration seen on limited exam Musculoskeletal:  grossly normal tone BUE/BLE, good ROM, no bony abnormality Psychiatric:  mildly anxious mood and affect, speech fluent and appropriate, AOx3 Neurologic:  CN 2-12 grossly intact, moves all extremities in coordinated fashion    Radiological Exams on Admission: Independently reviewed - see discussion in A/P where applicable  CT Angio Abd/Pel W and/or Wo Contrast  Result Date: 01/02/2021 CLINICAL DATA:  GI bleed, right upper quadrant abdominal pain, vomiting EXAM: CTA ABDOMEN AND PELVIS WITHOUT AND WITH CONTRAST TECHNIQUE: Multidetector CT imaging of the abdomen and pelvis was performed using the standard protocol during bolus administration of intravenous contrast. Multiplanar reconstructed images and MIPs were obtained and reviewed to evaluate the vascular anatomy. CONTRAST:  OMNIPAQUE IOHEXOL 350 MG/ML SOLN COMPARISON:  None. FINDINGS: VASCULAR Aorta: Normal Celiac: Unremarkable SMA: Unremarkable Renals: Normal IMA: Normal Inflow: Unremarkable Proximal Outflow: Unremarkable Veins:  No significant abnormality is seen Review of the MIP images confirms the above findings. NON-VASCULAR Lower chest: Unremarkable. Hepatobiliary: Liver measures 20.8 cm in length. There is fatty infiltration. Surgical clips are seen in gallbladder fossa. Prominence of common bile duct may be due to previous cholecystectomy. There is no dilation of intrahepatic bile ducts. Pancreas: No focal abnormality is seen. Spleen: Unremarkable. Adrenals/Urinary Tract: Adrenals are not enlarged. There is no hydronephrosis. There are no renal or ureteral stones. Urinary bladder is not distended. Stomach/Bowel: Stomach is not distended. Small bowel loops are not dilated. Appendix is not dilated. There is no wall thickening in the colon. There are no enhancing lesions in the bowel wall. There is no demonstrable extravasation of contrast in the arterial and portal venous phase images. Lymphatic: No significant lymphadenopathy seen. Reproductive: Unremarkable. Other: There is no ascites or pneumoperitoneum. Musculoskeletal: Unremarkable. IMPRESSION: VASCULAR There is no evidence of aneurysmal dilation or stenosis in the aorta and its major branches. There is no demonstrable active extravasation of contrast. There are no focal enhancing lesions in the bowel wall. NON-VASCULAR Enlarged fatty liver. Status post cholecystectomy. There is no evidence of intestinal obstruction or pneumoperitoneum. There is no hydronephrosis. Electronically Signed   By: Ernie Avena M.D.   On: 01/02/2021 17:36   US Abdomen Limited RUQ (LIVER/GB)  Result Date: 01/02/2021 CLINICAL DATA:  Epigastric pain.  History of cholecystectomy EXAM: ULTRASOUND ABDOMEN LIMITED RIGHT UPPER QUADRANT COMPARISON:  Same day CT FINDINGS: Gallbladder: Surgically absent. 1 cm echogenic, shadowing structure seen in the gallbladder fossa likely reflecting cholecystectomy clips. Common bile duct: Diameter: 12 mm. Liver: No focal lesion identified. Mildly increased hepatic  parenchymal echogenicity. Portal vein is patent on color Doppler imaging with normal direction of blood flow towards the liver. Other: None. IMPRESSION: 1. The echogenicity of the liver is increased. This is a nonspecific finding but is most commonly seen with fatty infiltration of the liver. There are no obvious focal liver lesions. 2. Prior cholecystectomy. Electronically Signed   By: Duanne Guess D.O.   On: 01/02/2021 19:35    EKG: not done   Labs on Admission: I have personally reviewed the available labs and imaging studies at the time of the admission.  Pertinent labs:   Normal CMP Normal CBC INR 0.9 COVID/flu negative Upreg negative UA: trace protein  Assessment/Plan Principal Problem:   Hematemesis Active Problems:   Alcohol dependence syndrome (HCC)   Essential hypertension   Hypothyroidism (acquired)   Depression with anxiety   Hematemesis/epigastric pain -Patient' is presenting with colicky midepigastric pain and hematemesis, suggestive of upper GI bleeding -She has h/o ETOH abuse and likely has a gastric ulcer associated with excessive ETOH intake -Her hematemesis once after self-induced vomiting and then a second time, perhaps associated with a Mallory-Weiss tear. -The patient is not tachycardic with normal blood pressure, suggesting subacute volume loss.  -Her initial labs were very normal -Will observe on telemetry -GI consulted  -NPO for EGD tomorrow -LR at 100 mL/hr -Start IV pantoprazole 40 BID for patients with ongoing bleeding who need urgent EGD -Zofran IV for nausea -Avoid NSAIDs and SQ heparin -Maintain IV access (2 large bore IVs if possible). -She was initially recommended to have a HIDA scan but I am unfamiliar with the utility of a HIDA scan following such a remote cholecystectomy, and RUQ [place] and CTA have both been unrevealing - but will defer to GI  ETOH dependence -Patient with chronic ETOH dependence -Number of drinks per day: 1 bottle of  wine -CIWA protocol -Folate, thiamine, and MVI ordered -Will provide symptom-triggered BZD (ativan per CIWA protocol) only since the patient is able to communicate; is not showing current signs of delirium; and has no history of severe withdrawal. -Elevated LFTs are likely related to fatty liver associated with alcoholism -Consider offering a medication for Alcohol Use Disorder at the time of d/c, to include Disulfuram; Naltrexone; or Acamprosate. -TOC team consult for substance abuse education  HTN -Continue Norvasc  Mood d/o -Continue Zoloft -This issue is likely to improve if the patient stops drinking  Hypothyroidism -Continue Synthroid    Note: This patient has been tested and is negative for the novel coronavirus COVID-19. She has been fully vaccinated against COVID-19.    Level of care: Telemetry Medical  DVT prophylaxis: SCDs Code Status:  Full Family Communication: None present Disposition Plan:  The patient is from: home  Anticipated d/c is to: home without [facility] services   Anticipated d/c date will depend on clinical response to treatment, but possibly as early as tomorrow if she has excellent response to treatment  Patient is currently: acutely ill Consults called: GI; TOC team Admission status: It is my clinical opinion that referral for OBSERVATION is reasonable and necessary in this patient based on the above information provided. The aforementioned taken together are felt to place the patient at high risk for further clinical deterioration. However it is anticipated that the patient may be medically stable for discharge from the hospital within 24 to 48 hours.       Jonah Blue MD [facility]   How to contact the [facility] Attending or Consulting provider 7A - 7P or covering provider during after hours 7P -7A, for this patient?  Check the care team in [facility] and look for a) attending/consulting [facility] provider listed and b) the [facility] team listed Log into  [email or web address] and use [facility] universal password to access. If you do not have the password, please contact the hospital operator. Locate the [facility] provider you are looking for under [facility] and page to a number that you can be directly reached. If you still have difficulty reaching the provider, please page the [facility] (Director on Call) for the Hospitalists listed on amion for assistance.   01/03/2021, 3:48 PM

## 2021-01-03 NOTE — Consult Note (Addendum)
Consultation  Referring Provider:  Dr. Ophelia Charter    Primary Care Physician:  Courtney Paris, NP Primary Gastroenterologist:  [facility]      Reason for Consultation:  Hematemesis, Abdominal Pain            HPI:   Anna Glover is a 45 y.o. female with a past medical history significant for alcohol dependence, depression/anxiety and hypothyroidism, who presented to the ED on 12/23/20 with upper GI bleeding.    Today, the patient explains that at around 3:00 AM she woke up with severe 10/10 epigastric abdominal pain which was "similar to when I had my gallbladder attacks".  Explains that this seemed to "ebb and flow" like contractions and was terrible feeling.  She could just not get herself comfortable and remained nauseated throughout the day.  She tried to eat a banana and drink some water but this seemed to make it worse.  Then around 130 yesterday afternoon she had some diarrhea which "is not abnormal for me I have IBS", but continued with nausea and remembered having to make her self vomit when she would have gallbladder issues, so she did the same but it did not make things any better and in fact when she vomited she saw a banana and a clear liquid fluid as well as "black specks", she vomited again about an hour later and saw some "red blotches" in the vomitus.  This is when they decided to come to the ER.    Patient tells me now that after receiving Morphine and Zofran she has had no further abdominal pain but  still feels "sensitive" in there.  She ate some saltines this morning.  Describes that she thinks she was just nauseous because of the pain.  Patient is worried in regards to a dilated bile duct seen in on imaging at [facility] ER.  She has not had a bowel movement since yesterday morning.    Does admit to at least a bottle of wine per day over the past year and tells me "I know this is too much".    Denies fever, chills, weight loss or change in bowel habits.  ED course: [facility] to  [facility]  GI history: Reports seeing [facility] and being diagnosed with IBS by their gastroenterologist in the past  Past Medical History:  Diagnosis Date   Anxiety    Depression    Hypertension    Hypothyroidism     Past Surgical History:  Procedure Laterality Date   CESAREAN SECTION  02/25/2003   CESAREAN SECTION  05/01/2009   CHOLECYSTECTOMY  June 2014    Family History  Problem Relation Age of Onset   Hypertension Mother    Hyperlipidemia Mother    Hypertension Father    Heart disease Father    Pulmonary fibrosis Father    Migraines Neg Hx     Social History   Tobacco Use   Smoking status: Former    Years: 10.00    Types: Cigarettes    Quit date: 07/10/2009    Years since quitting: 11.4   Smokeless tobacco: Never  Vaping Use   Vaping Use: Never used  Substance Use Topics   Alcohol use: Yes    Alcohol/week: 1.0 standard drink    Types: 1 Glasses of wine per week    Comment: "too much" - a bottle of wine a night   Drug use: No    Prior to Admission medications   Medication  Sig Start Date End Date Taking? Authorizing Provider  celecoxib (CELEBREX) 200 MG capsule TAKE 1 CAPSULE BY MOUTH AS NEEDED 08/13/18   Cristie Hem, PA-C  DiphenhydrAMINE HCl (BENADRYL ALLERGY PO) Take 2 tablets by mouth at bedtime.    [provider]  doxycycline (VIBRA-TABS) 100 MG tablet Take 100 mg by mouth 2 (two) times daily.    [provider]  HYDROcodone-acetaminophen (NORCO) 5-325 MG tablet Take 1-2 tablets by mouth 3 (three) times daily as needed. 07/20/20   Cristie Hem, PA-C  ibuprofen (ADVIL,MOTRIN) 200 MG tablet Take 200 mg by mouth every 6 (six) hours as needed for mild pain.    [provider]  LESSINA-28 0.1-20 MG-MCG tablet  02/25/16   [provider]  levothyroxine (SYNTHROID, LEVOTHROID) 137 MCG tablet Take 137 mcg by mouth daily.    [provider]  Loratadine 10 MG CAPS Take 10 mg by mouth daily.      [provider]  methylPREDNISolone (MEDROL DOSEPAK) 4 MG TBPK tablet Use as instructed 06/04/14   Ghimire, Werner Lean, MD  methylPREDNISolone (MEDROL DOSEPAK) 4 MG TBPK tablet Take all together in the morning daily each day 06/10/14   Anson Fret, MD  ondansetron (ZOFRAN) 4 MG tablet Take 1 tablet (4 mg total) by mouth every 8 (eight) hours as needed for nausea or vomiting. 07/20/20   Cristie Hem, PA-C  oxyCODONE (ROXICODONE) 5 MG immediate release tablet Take 1 tablet (5 mg total) by mouth every 6 (six) hours as needed for severe pain. 06/04/14   Ghimire, Werner Lean, MD  sertraline (ZOLOFT) 50 MG tablet Take 50 mg by mouth daily.    [provider]    Current Facility-Administered Medications  Medication Dose Route Frequency Provider Last Rate Last Admin   acetaminophen (TYLENOL) tablet 650 mg  650 mg Oral Q6H PRN Jonah Blue, MD       Or   acetaminophen (TYLENOL) suppository 650 mg  650 mg Rectal Q6H PRN Jonah Blue, MD       folic acid (FOLVITE) tablet 1 mg  1 mg Oral Daily Jonah Blue, MD       hydrALAZINE (APRESOLINE) injection 5 mg  5 mg Intravenous Q4H PRN Jonah Blue, MD       lactated ringers infusion   Intravenous Continuous Jonah Blue, MD 100 mL/hr at 01/03/21 1109 New Bag at 01/03/21 1109   LORazepam (ATIVAN) tablet 1-4 mg  1-4 mg Oral Q1H PRN Jonah Blue, MD       Or   LORazepam (ATIVAN) injection 1-4 mg  1-4 mg Intravenous Q1H PRN Jonah Blue, MD       morphine 2 MG/ML injection 2 mg  2 mg Intravenous Q2H PRN Jonah Blue, MD       multivitamin with minerals tablet 1 tablet  1 tablet Oral Daily Jonah Blue, MD       ondansetron [facility]) tablet 4 mg  4 mg Oral Q6H PRN Jonah Blue, MD       Or   ondansetron [facility]) injection 4 mg  4 mg Intravenous Q6H PRN Jonah Blue, MD       pantoprazole (PROTONIX) injection 40 mg  40 mg Intravenous Steva Colder, MD   40 mg at 01/03/21 1109   thiamine tablet 100 mg   100 mg Oral Daily Jonah Blue, MD       Or   thiamine (B-1) injection 100 mg  100 mg Intravenous Daily Jonah Blue, MD  Allergies as of 01/02/2021 - Review Complete 01/02/2021  Allergen Reaction Noted   Cefdinir Other (See Comments) 07/10/2013   Elemental sulfur Other (See Comments) 07/10/2013   Erythromycin Nausea And Vomiting 01/17/2012   Penicillins Hives 01/17/2012     Review of Systems:    Constitutional: No weight loss, fever or chills Skin: No rash  Cardiovascular: No chest pain Respiratory: No SOB  Gastrointestinal: See HPI and otherwise negative Genitourinary: No dysuria  Neurological: No headache, dizziness or syncope Musculoskeletal: No new muscle or joint pain Hematologic: No bleeding  Psychiatric: No history of depression or anxiety    Physical Exam:  Vital signs in last 24 hours: Temp:  [98.2 F (36.8 C)-98.6 F (37 C)] 98.6 F (37 C) (11/20 0856) Pulse Rate:  [50-66] 53 (11/20 0856) Resp:  [14-19] 15 (11/20 0856) BP: (129-170)/(79-99) 129/83 (11/20 0856) SpO2:  [95 %-100 %] 95 % (11/20 0856) Weight:  [95.3 kg] 95.3 kg (11/19 1439) Last BM Date: 01/02/21 General:   Pleasant Caucasian female appears to be in NAD, Well developed, Well nourished, alert and cooperative Head:  Normocephalic and atraumatic. Eyes:   PEERL, EOMI. No icterus. Conjunctiva pink. Ears:  Normal auditory acuity. Neck:  Supple Throat: Oral cavity and pharynx without inflammation, swelling or lesion.  Lungs: Respirations even and unlabored. Lungs clear to auscultation bilaterally.   No wheezes, crackles, or rhonchi.  Heart: Normal S1, S2. No MRG. Regular rate and rhythm. No peripheral edema, cyanosis or pallor.  Abdomen:  Soft, nondistended, mild epigastric ttp, No rebound or guarding. Normal bowel sounds. No appreciable masses or hepatomegaly. Rectal:  Not performed.  Msk:  Symmetrical without gross deformities. Peripheral pulses intact.  Extremities:  Without edema, no  deformity or joint abnormality.  Neurologic:  Alert and  oriented x4;  grossly normal neurologically.  Skin:   Dry and intact without significant lesions or rashes. Psychiatric: Demonstrates good judgement and reason without abnormal affect or behaviors.  LAB RESULTS: Recent Labs    01/02/21 1446  WBC 7.9  HGB 15.1*  HCT 44.2  PLT 231   BMET Recent Labs    01/02/21 1446  NA 138  K 3.9  CL 103  CO2 23  GLUCOSE 99  BUN 11  CREATININE 0.83  CALCIUM 9.7   LFT Recent Labs    01/02/21 1446  PROT 7.9  ALBUMIN 4.7  AST 20  ALT 20  ALKPHOS 73  BILITOT 0.7   PT/INR Recent Labs    01/02/21 1601  LABPROT 12.5  INR 0.9    STUDIES: CT Angio Abd/Pel W and/or Wo Contrast  Result Date: 01/02/2021 CLINICAL DATA:  GI bleed, right upper quadrant abdominal pain, vomiting EXAM: CTA ABDOMEN AND PELVIS WITHOUT AND WITH CONTRAST TECHNIQUE: Multidetector CT imaging of the abdomen and pelvis was performed using the standard protocol during bolus administration of intravenous contrast. Multiplanar reconstructed images and MIPs were obtained and reviewed to evaluate the vascular anatomy. CONTRAST:  OMNIPAQUE IOHEXOL 350 MG/ML SOLN COMPARISON:  None. FINDINGS: VASCULAR Aorta: Normal Celiac: Unremarkable SMA: Unremarkable Renals: Normal IMA: Normal Inflow: Unremarkable Proximal Outflow: Unremarkable Veins: No significant abnormality is seen Review of the MIP images confirms the above findings. NON-VASCULAR Lower chest: Unremarkable. Hepatobiliary: Liver measures 20.8 cm in length. There is fatty infiltration. Surgical clips are seen in gallbladder fossa. Prominence of common bile duct may be due to previous cholecystectomy. There is no dilation of intrahepatic bile ducts. Pancreas: No focal abnormality is seen. Spleen: Unremarkable. Adrenals/Urinary Tract: Adrenals are not  enlarged. There is no hydronephrosis. There are no renal or ureteral stones. Urinary bladder is not distended.  Stomach/Bowel: Stomach is not distended. Small bowel loops are not dilated. Appendix is not dilated. There is no wall thickening in the colon. There are no enhancing lesions in the bowel wall. There is no demonstrable extravasation of contrast in the arterial and portal venous phase images. Lymphatic: No significant lymphadenopathy seen. Reproductive: Unremarkable. Other: There is no ascites or pneumoperitoneum. Musculoskeletal: Unremarkable. IMPRESSION: VASCULAR There is no evidence of aneurysmal dilation or stenosis in the aorta and its major branches. There is no demonstrable active extravasation of contrast. There are no focal enhancing lesions in the bowel wall. NON-VASCULAR Enlarged fatty liver. Status post cholecystectomy. There is no evidence of intestinal obstruction or pneumoperitoneum. There is no hydronephrosis. Electronically Signed   By: Ernie Avena M.D.   On: 01/02/2021 17:36   US Abdomen Limited RUQ (LIVER/GB)  Result Date: 01/02/2021 CLINICAL DATA:  Epigastric pain.  History of cholecystectomy EXAM: ULTRASOUND ABDOMEN LIMITED RIGHT UPPER QUADRANT COMPARISON:  Same day CT FINDINGS: Gallbladder: Surgically absent. 1 cm echogenic, shadowing structure seen in the gallbladder fossa likely reflecting cholecystectomy clips. Common bile duct: Diameter: 12 mm. Liver: No focal lesion identified. Mildly increased hepatic parenchymal echogenicity. Portal vein is patent on color Doppler imaging with normal direction of blood flow towards the liver. Other: None. IMPRESSION: 1. The echogenicity of the liver is increased. This is a nonspecific finding but is most commonly seen with fatty infiltration of the liver. There are no obvious focal liver lesions. 2. Prior cholecystectomy. Electronically Signed   By: Duanne Guess D.O.   On: 01/02/2021 19:35      Impression / Plan:   Impression: 1.  Epigastric abdominal pain: Likely with gastritis given alcohol abuse +/-PUD 2.  Hematemesis: 1 episode  of "black specks" and another episode of "red blotches" in her vomitus, no further episodes of vomiting since then, hemoglobin stable; likely Mallory-Weiss +/- gastritis +/- esophagitis 3.  History of IBS-D 4.  Prominence of common bile duct on CTA: This is a normal physiological response after cholecystectomy, no mention of dilation on ultrasound, liver enzymes normal 5.  Alcoholism: Patient drinks a bottle of wine per night over the past year, likely contributing to all of the above  Plan: 1.  Patient will be scheduled for an EGD tomorrow 11/21 with Dr. Orvan Falconer.  Did discuss risks, benefits, limitations and alternatives the patient agrees to proceed. 2.  Patient can be on a clear liquid diet as tolerated today and n.p.o. at midnight 3.  Continue analgesics and antiemetics 4.  Had a detailed discussion with the patient in regards to various lab testing she was inquiring about including HIDA scan, ERCP and MRI.  I answered all of her questions.  There is no need for further evaluation of "prominent" bile duct given that this is normal after a cholecystectomy. 5.  Did discuss patient's alcoholism.  She needs to abstain in the future. 6.  Discussed patient's fatty liver.  Again abstaining from alcohol would help with this as well. 7.  Agree with Pantoprazole 40 mg twice daily.  She likely needs to remain on a PPI given severe epigastric pain and alcohol use, but can make further decisions after EGD. 8.  Continue to monitor hemoglobin every 24 hours unless patient has no overt episode of GI bleeding.  Thank you for your kind consultation, we will continue to follow.  Violet Baldy [facility]  01/03/2021, 11:42 AM  ________________________________________________________________________  [facility] MD note:  I personally examined the patient, reviewed the data and agree with the assessment and plan described above. 45 yo woman s/p remote lap chole, here with epig abd pain, nausea, vomiting with  minor hematemesis.  Hb normal.  CT and [place] unrevealing except for dilated CBD which is possibly related to remote cholecystectomy, normal physiologic response.  Her LFTs were normal yeterday, AST and ALT a bit elevated today.  We are planning EGD tomorrow and if no clear explanation for her symptoms then she may need further evaluation of the biliary tree with MRCP.  Will repeat LFTs in the AM as well.   Rob Bunting, MD [facility] Pager [phone]

## 2021-01-04 ENCOUNTER — Encounter (HOSPITAL_COMMUNITY)
Admission: EM | Disposition: A | Discharge status: Home / Self Care | Payer: Self-pay | Source: Home / Self Care | Attending: Student

## 2021-01-04 ENCOUNTER — Observation Stay (HOSPITAL_COMMUNITY): Payer: BC Managed Care – PPO | Admitting: Anesthesiology

## 2021-01-04 ENCOUNTER — Encounter (HOSPITAL_COMMUNITY): Payer: Self-pay | Admitting: Internal Medicine

## 2021-01-04 DIAGNOSIS — I10 Essential (primary) hypertension: Secondary | ICD-10-CM | POA: Diagnosis not present

## 2021-01-04 DIAGNOSIS — K2100 Gastro-esophageal reflux disease with esophagitis, without bleeding: Secondary | ICD-10-CM

## 2021-01-04 DIAGNOSIS — R1011 Right upper quadrant pain: Secondary | ICD-10-CM

## 2021-01-04 DIAGNOSIS — K209 Esophagitis, unspecified without bleeding: Secondary | ICD-10-CM

## 2021-01-04 DIAGNOSIS — K920 Hematemesis: Secondary | ICD-10-CM | POA: Diagnosis not present

## 2021-01-04 DIAGNOSIS — K2970 Gastritis, unspecified, without bleeding: Secondary | ICD-10-CM

## 2021-01-04 DIAGNOSIS — K3189 Other diseases of stomach and duodenum: Secondary | ICD-10-CM | POA: Diagnosis not present

## 2021-01-04 HISTORY — PX: ESOPHAGOGASTRODUODENOSCOPY (EGD) WITH PROPOFOL: SHX5813

## 2021-01-04 HISTORY — PX: BIOPSY: SHX5522

## 2021-01-04 LAB — CBC
HCT: 39.8 % (ref 36.0–46.0)
Hemoglobin: 13.2 g/dL (ref 12.0–15.0)
MCH: 32.2 pg (ref 26.0–34.0)
MCHC: 33.2 g/dL (ref 30.0–36.0)
MCV: 97.1 fL (ref 80.0–100.0)
Platelets: 199 10*3/uL (ref 150–400)
RBC: 4.1 MIL/uL (ref 3.87–5.11)
RDW: 12.1 % (ref 11.5–15.5)
WBC: 5.1 10*3/uL (ref 4.0–10.5)
nRBC: 0 % (ref 0.0–0.2)

## 2021-01-04 LAB — COMPREHENSIVE METABOLIC PANEL
ALT: 46 U/L — ABNORMAL HIGH (ref 0–44)
AST: 33 U/L (ref 15–41)
Albumin: 3.4 g/dL — ABNORMAL LOW (ref 3.5–5.0)
Alkaline Phosphatase: 87 U/L (ref 38–126)
Anion gap: 5 (ref 5–15)
BUN: 7 mg/dL (ref 6–20)
CO2: 27 mmol/L (ref 22–32)
Calcium: 8.8 mg/dL — ABNORMAL LOW (ref 8.9–10.3)
Chloride: 104 mmol/L (ref 98–111)
Creatinine, Ser: 0.92 mg/dL (ref 0.44–1.00)
GFR, Estimated: >60 mL/min (ref >60)
Glucose, Bld: 103 mg/dL — ABNORMAL HIGH (ref 70–99)
Potassium: 3.6 mmol/L (ref 3.5–5.1)
Sodium: 136 mmol/L (ref 135–145)
Total Bilirubin: 1.1 mg/dL (ref 0.3–1.2)
Total Protein: 6.2 g/dL — ABNORMAL LOW (ref 6.5–8.1)

## 2021-01-04 SURGERY — ESOPHAGOGASTRODUODENOSCOPY (EGD) WITH PROPOFOL
Anesthesia: Monitor Anesthesia Care

## 2021-01-04 MED ORDER — PANTOPRAZOLE SODIUM 40 MG PO TBEC
40.0000 mg | DELAYED_RELEASE_TABLET | Freq: Two times a day (BID) | ORAL | Status: DC
  Administered 2021-01-04: 40 mg via ORAL
  Filled 2021-01-04: qty 1

## 2021-01-04 MED ORDER — SODIUM CHLORIDE 0.9 % IV SOLN: INTRAVENOUS | Status: DC

## 2021-01-04 MED ORDER — LACTATED RINGERS IV SOLN: Freq: Once | INTRAVENOUS | Status: AC

## 2021-01-04 MED ORDER — PANTOPRAZOLE SODIUM 40 MG PO TBEC: 40.0000 mg | DELAYED_RELEASE_TABLET | Freq: Two times a day (BID) | ORAL | 0 refills | Status: DC

## 2021-01-04 MED ORDER — PROPOFOL 500 MG/50ML IV EMUL
INTRAVENOUS | Status: DC | PRN
  Administered 2021-01-04: 125 ug/kg/min via INTRAVENOUS

## 2021-01-04 MED ORDER — LIDOCAINE 2% (20 MG/ML) 5 ML SYRINGE
INTRAMUSCULAR | Status: DC | PRN
Start: 2021-01-04 — End: 2021-01-04
  Administered 2021-01-04: 40 mg via INTRAVENOUS

## 2021-01-04 MED ORDER — ONDANSETRON HCL 4 MG PO TABS: 4.0000 mg | ORAL_TABLET | Freq: Three times a day (TID) | ORAL | 0 refills | Status: DC | PRN

## 2021-01-04 SURGICAL SUPPLY — 15 items

## 2021-01-04 NOTE — Anesthesia Postprocedure Evaluation (Signed)
Anesthesia Post Note  Patient: Anna Glover  Procedure(s) Performed: ESOPHAGOGASTRODUODENOSCOPY (EGD) WITH PROPOFOL BIOPSY     Patient location during evaluation: PACU Anesthesia Type: MAC Level of consciousness: awake and alert Pain management: pain level controlled Vital Signs Assessment: post-procedure vital signs reviewed and stable Respiratory status: spontaneous breathing, nonlabored ventilation, respiratory function stable and patient connected to nasal cannula oxygen Cardiovascular status: stable and blood pressure returned to baseline Postop Assessment: no apparent nausea or vomiting Anesthetic complications: no   No notable events documented.  Last Vitals:  Vitals:   01/04/21 0926 01/04/21 0936  BP: 128/84 132/85  Pulse: (!) 44 (!) 50  Resp: 17 19  Temp:    SpO2: 100% 98%    Last Pain:  Vitals:   01/04/21 1000  TempSrc:   PainSc: 0-No pain                 Sadia Belfiore

## 2021-01-04 NOTE — Interval H&P Note (Signed)
History and Physical Interval Note:  01/04/2021 8:51 AM  Anna Glover  has presented today for surgery, with the diagnosis of Hematemesis.  The various methods of treatment have been discussed with the patient and family. After consideration of risks, benefits and other options for treatment, the patient has consented to  Procedure(s): ESOPHAGOGASTRODUODENOSCOPY (EGD) WITH PROPOFOL (N/A) as a surgical intervention.  The patient's history has been reviewed, patient examined, no change in status, stable for surgery.  I have reviewed the patient's chart and labs.  Questions were answered to the patient's satisfaction.     Tressia Danas

## 2021-01-04 NOTE — Progress Notes (Signed)
Shift Summary: No acute events this shift. A&Ox4. VSS on room air. No complaints of pain this shift. Patient denies N/V/D this shift. + BS and air, - BM. Patient ambulating in room independently. Patient resting comfortably in bed, NAD, will continue to monitor until hand-off.

## 2021-01-04 NOTE — Transfer of Care (Signed)
Immediate Anesthesia Transfer of Care Note  Patient: Anna Glover  Procedure(s) Performed: ESOPHAGOGASTRODUODENOSCOPY (EGD) WITH PROPOFOL BIOPSY  Patient Location: Endoscopy Unit  Anesthesia Type:MAC  Level of Consciousness: awake, alert  and oriented  Airway & Oxygen Therapy: Patient Spontanous Breathing  Post-op Assessment: Report given to RN, Post -op Vital signs reviewed and stable and Patient moving all extremities  Post vital signs: Reviewed and stable  Last Vitals:  Vitals Value Taken Time  BP 124/80 01/04/21 0916  Temp 36.5 C 01/04/21 0916  Pulse 45 01/04/21 0919  Resp 21 01/04/21 0919  SpO2 98 % 01/04/21 0919  Vitals shown include unvalidated device data.  Last Pain:  Vitals:   01/04/21 0916  TempSrc: Temporal  PainSc: 0-No pain         Complications: No notable events documented.

## 2021-01-04 NOTE — Op Note (Addendum)
[facility] Patient Name: Anna Glover Procedure Date : 01/04/2021 MRN: [id number] Attending MD: Tressia Danas MD, MD Date of Birth: [birth year] CSN: [id number] Age: 45 Admit Type: Inpatient Procedure:                Upper GI endoscopy Indications:              Upper abdominal pain, Hematemesis Providers:                Tressia Danas MD, MD, Fayrene Fearing, RN, Salley Scarlet, Technician, Carmelina Dane, CRNA Referring MD:              Medicines:                Monitored Anesthesia Care Complications:            No immediate complications. Estimated blood loss:                            Minimal. Estimated Blood Loss:     Estimated blood loss was minimal. Procedure:                Pre-Anesthesia Assessment:                           - Prior to the procedure, a History and Physical                            was performed, and patient medications and                            allergies were reviewed. The patient's tolerance of                            previous anesthesia was also reviewed. The risks                            and benefits of the procedure and the sedation                            options and risks were discussed with the patient.                            All questions were answered, and informed consent                            was obtained. Prior Anticoagulants: The patient has                            taken no previous anticoagulant or antiplatelet                            agents. ASA Grade Assessment: II - A patient with  mild systemic disease. After reviewing the risks                            and benefits, the patient was deemed in                            satisfactory condition to undergo the procedure.                           After obtaining informed consent, the endoscope was                            passed under direct vision. Throughout the                             procedure, the patient's blood pressure, pulse, and                            oxygen saturations were monitored continuously. The                            [id number]-[id number] ([id number]) Olympus endoscope was introduced                            through the mouth, and advanced to the third part                            of duodenum. The upper GI endoscopy was                            accomplished without difficulty. The patient                            tolerated the procedure well. Scope In: Scope Out: Findings:      LA Grade A (one or more mucosal breaks less than 5 mm, not extending       between tops of 2 mucosal folds) esophagitis with no bleeding was found       39 cm from the incisors. Biopsies were taken with a cold forceps for       histology. Estimated blood loss was minimal.      A medium-sized hiatal hernia was present.      Patchy mildly erythematous mucosa without bleeding was found in the       gastric body. Biopsies were taken from the antrum, body, and fundus with       a cold forceps for histology. Estimated blood loss was minimal.      The examined duodenum was normal. Biopsies were taken with a cold       forceps for histology. Estimated blood loss was minimal.      The exam was otherwise without abnormality. Impression:               - LA Grade A reflux esophagitis with no bleeding.                            Biopsied.                           -  Medium-sized hiatal hernia.                           - Mild erythematous mucosa in the gastric body.                            Biopsied. This may be related to alcohol use.                           - Normal examined duodenum. Biopsied.                           - The examination was otherwise normal. Recommendation:           - Patient has a contact number available for                            emergencies. The signs and symptoms of potential                            delayed complications were discussed with the                             patient. Return to normal activities tomorrow.                            Written discharge instructions were provided to the                            patient.                           - Resume previous diet.                           - Continue present medications.                           - Convert pantoprazole 40 mg IV BID to po BID.                            Complete for at least 8-10 weeks.                           - No aspirin, ibuprofen, naproxen, or other                            non-steroidal anti-inflammatory drugs.                           - Reflux lifestyle modifications.                           - Discuss strategy for reducing alcohol intake with  primary care team.                           - Await pathology results.                           - Okay for discharge from a GI perspective.                           - Follow-up with [facility] in 3-4                            weeks, earlier if needed. Procedure Code(s):        --- Professional ---                           [phone], Esophagogastroduodenoscopy, flexible,                            transoral; with biopsy, single or multiple Diagnosis Code(s):        --- Professional ---                           K21.00, Gastro-esophageal reflux disease with                            esophagitis, without bleeding                           K44.9, Diaphragmatic hernia without obstruction or                            gangrene                           K31.89, Other diseases of stomach and duodenum                           R10.10, Upper abdominal pain, unspecified                           K92.0, Hematemesis CPT copyright 2019 American Medical Association. All rights reserved. The codes documented in this report are preliminary and upon coder review may  be revised to meet current compliance requirements. Tressia Danas MD, MD 01/04/2021 9:21:47 AM This report  has been signed electronically. Number of Addenda: 0

## 2021-01-04 NOTE — Anesthesia Procedure Notes (Signed)
Procedure Name: MAC Date/Time: 01/04/2021 8:56 AM Performed by: Kyung Rudd, CRNA Pre-anesthesia Checklist: Patient identified, Emergency Drugs available, Suction available and Patient being monitored Patient Re-evaluated:Patient Re-evaluated prior to induction Oxygen Delivery Method: Nasal cannula Induction Type: IV induction Placement Confirmation: positive ETCO2 Dental Injury: Teeth and Oropharynx as per pre-operative assessment

## 2021-01-04 NOTE — Anesthesia Preprocedure Evaluation (Addendum)
Anesthesia Evaluation  Patient identified by MRN, date of birth, ID band Patient awake    Reviewed: Allergy & Precautions, H&P , NPO status , Patient's Chart, lab work & pertinent test results, reviewed documented beta blocker date and time   Airway Mallampati: I  TM Distance: >3 FB Neck ROM: full    Dental no notable dental hx. (+) Teeth Intact, Dental Advisory Given   Pulmonary neg pulmonary ROS, former smoker,    Pulmonary exam normal breath sounds clear to auscultation       Cardiovascular Exercise Tolerance: Good hypertension, Pt. on medications negative cardio ROS   Rhythm:regular Rate:Normal     Neuro/Psych  Headaches, PSYCHIATRIC DISORDERS Anxiety Depression    GI/Hepatic negative GI ROS, (+)     substance abuse  alcohol use, Hepatitis -  Endo/Other  Hypothyroidism   Renal/GU negative Renal ROS  negative genitourinary   Musculoskeletal   Abdominal   Peds  Hematology negative hematology ROS (+)   Anesthesia Other Findings   Reproductive/Obstetrics negative OB ROS                            Anesthesia Physical Anesthesia Plan  ASA: 3  Anesthesia Plan: MAC   Post-op Pain Management: Minimal or no pain anticipated   Induction: Intravenous  PONV Risk Score and Plan: 2 and Treatment may vary due to age or medical condition  Airway Management Planned: Nasal Cannula, Natural Airway and Simple Face Mask  Additional Equipment:   Intra-op Plan:   Post-operative Plan:   Informed Consent: I have reviewed the patients History and Physical, chart, labs and discussed the procedure including the risks, benefits and alternatives for the proposed anesthesia with the patient or authorized representative who has indicated his/her understanding and acceptance.     Dental Advisory Given  Plan Discussed with: CRNA and Anesthesiologist  Anesthesia Plan Comments: (HPI: Anna Glover is a 45 y.o. female with medical history significant of ETOH dependence; depression/anxiety; and hypothyroidism presenting with UGI bleeding. At 3am Saturday AM, she awoke with severe abdominal pain.)        Anesthesia Quick Evaluation

## 2021-01-04 NOTE — Discharge Instructions (Signed)
Follow with Primary MD Anna Paris, NP and your gastroenterologist in 7 days   Get CBC, CMP, 2 view Chest X ray -  checked next visit within 1 week by Primary MD   Activity: As tolerated with Full fall precautions use walker/cane & assistance as needed  Disposition Home     Diet: Heart Healthy   Special Instructions: If you have smoked or chewed Tobacco  in the last 2 yrs please stop smoking, stop any regular Alcohol  and or any Recreational drug use.  On your next visit with your primary care physician please Get Medicines reviewed and adjusted.  Please request your Prim.MD to go over all Hospital Tests and Procedure/Radiological results at the follow up, please get all Hospital records sent to your Prim MD by signing hospital release before you go home.  If you experience worsening of your admission symptoms, develop shortness of breath, life threatening emergency, suicidal or homicidal thoughts you must seek medical attention immediately by calling 911 or calling your MD immediately  if symptoms less severe.  You Must read complete instructions/literature along with all the possible adverse reactions/side effects for all the Medicines you take and that have been prescribed to you. Take any new Medicines after you have completely understood and accpet all the possible adverse reactions/side effects.

## 2021-01-04 NOTE — Discharge Summary (Signed)
Anna Glover QIW:[id number] DOB: [birth year] DOA: 01/02/2021  PCP: Courtney Paris, NP  Admit date: 01/02/2021  Discharge date: 01/04/2021  Admitted From: Home   Disposition:  Home   Recommendations for Outpatient Follow-up:   Follow up with PCP in 1-2 weeks  PCP Please obtain BMP/CBC, 2 view CXR in 1week,  (see Discharge instructions)   PCP Please follow up on the following pending results: Needs close outpatient GI follow-up, kindly monitor alcohol use closely   Home Health: None   Equipment/Devices: None  Consultations: GI Discharge Condition: Stable    CODE STATUS: Full    Diet Recommendation: Heart Healthy   Diet Order             Diet regular Room service appropriate? Yes; Fluid consistency: Thin  Diet effective now           Diet - low sodium heart healthy                    Chief Complaint  Patient presents with   Abdominal Pain   Emesis   Diarrhea     Brief history of present illness from the day of admission and additional interim summary    Anna Glover is a 45 y.o. female with medical history significant of ETOH dependence; depression/anxiety; and hypothyroidism presenting with gastric abdominal pain which woke her up at night along with some nausea vomiting, never some specks of blood in her vomitus and she was admitted for further work-up.                                                                 Hospital Course     Epigastric abdominal pain along with nausea vomiting with some questionable acute upper GI bleed.  Stable H&H no transfusion needed, was seen by GI underwent EGD which showed mild esophagitis, gastritis and hiatal hernia, requested to abstain from alcohol use, last alcoholic drink on Friday according to the patient, PPI twice daily with outpatient GI and  PCP follow-up.  Currently relatively symptom-free.  Abdominal imaging stable.  This discussed with Dr. Orvan Falconer the GI physician patient stable for discharge with outpatient PCP and her primary GI follow-up.  2.  Alcohol abuse.  Strictly counseled to quit no signs of DTs, last alcoholic drink on Friday.  PCP to monitor.  3.  Hypertension.  Continue home dose Norvasc.  4.  HX of anxiety depression.  No acute issues continue home dose Zoloft.  5. Hypothyroidism.  On Synthroid     Discharge diagnosis     Principal Problem:   Hematemesis Active Problems:   Alcohol dependence syndrome (HCC)   Essential hypertension   Hypothyroidism (acquired)   Depression with anxiety   Gastritis and gastroduodenitis   Acute esophagitis   RUQ pain  Discharge instructions    Discharge Instructions     Diet - low sodium heart healthy   Complete by: As directed    Discharge instructions   Complete by: As directed    Follow with Primary MD Courtney Paris, NP and your gastroenterologist in 7 days   Get CBC, CMP, 2 view Chest X ray -  checked next visit within 1 week by Primary MD   Activity: As tolerated with Full fall precautions use walker/cane & assistance as needed  Disposition Home     Diet: Heart Healthy   Special Instructions: If you have smoked or chewed Tobacco  in the last 2 yrs please stop smoking, stop any regular Alcohol  and or any Recreational drug use.  On your next visit with your primary care physician please Get Medicines reviewed and adjusted.  Please request your Prim.MD to go over all Hospital Tests and Procedure/Radiological results at the follow up, please get all Hospital records sent to your Prim MD by signing hospital release before you go home.  If you experience worsening of your admission symptoms, develop shortness of breath, life threatening emergency, suicidal or homicidal thoughts you must seek medical attention immediately by calling 911 or calling your MD  immediately  if symptoms less severe.  You Must read complete instructions/literature along with all the possible adverse reactions/side effects for all the Medicines you take and that have been prescribed to you. Take any new Medicines after you have completely understood and accpet all the possible adverse reactions/side effects.   Increase activity slowly   Complete by: As directed        Discharge Medications   Allergies as of 01/04/2021       Reactions   Cefdinir Other (See Comments)   Rectal bleeding   Elemental Sulfur Other (See Comments)   Rectal bleeding   Erythromycin Nausea And Vomiting   Penicillins Hives        Medication List     TAKE these medications    amLODipine 10 MG tablet Commonly known as: NORVASC Take 10 mg by mouth at bedtime.   levothyroxine 137 MCG tablet Commonly known as: SYNTHROID Take 137 mcg by mouth daily.   Loratadine 10 MG Caps Take 10 mg by mouth at bedtime.   ondansetron 4 MG tablet Commonly known as: Zofran Take 1 tablet (4 mg total) by mouth every 8 (eight) hours as needed for nausea or vomiting.   pantoprazole 40 MG tablet Commonly known as: PROTONIX Take 1 tablet (40 mg total) by mouth 2 (two) times daily.   sertraline 50 MG tablet Commonly known as: ZOLOFT Take 50 mg by mouth at bedtime.         Follow-up Information     Courtney Paris, NP. Schedule an appointment as soon as possible for a visit in 1 week(s).   Specialty: Nurse Practitioner Why: And your gastroenterologist at [facility] in 1 week Contact information: [address] [place] [address] [phone]                 Major procedures and Radiology Reports - PLEASE review detailed and final reports thoroughly  -       CT Angio Abd/Pel W and/or Wo Contrast  Result Date: 01/02/2021 CLINICAL DATA:  GI bleed, right upper quadrant abdominal pain, vomiting EXAM: CTA ABDOMEN AND PELVIS WITHOUT AND WITH CONTRAST TECHNIQUE: Multidetector CT  imaging of the abdomen and pelvis was performed using the standard protocol during bolus administration of intravenous  contrast. Multiplanar reconstructed images and MIPs were obtained and reviewed to evaluate the vascular anatomy. CONTRAST:  OMNIPAQUE IOHEXOL 350 MG/ML SOLN COMPARISON:  None. FINDINGS: VASCULAR Aorta: Normal Celiac: Unremarkable SMA: Unremarkable Renals: Normal IMA: Normal Inflow: Unremarkable Proximal Outflow: Unremarkable Veins: No significant abnormality is seen Review of the MIP images confirms the above findings. NON-VASCULAR Lower chest: Unremarkable. Hepatobiliary: Liver measures 20.8 cm in length. There is fatty infiltration. Surgical clips are seen in gallbladder fossa. Prominence of common bile duct may be due to previous cholecystectomy. There is no dilation of intrahepatic bile ducts. Pancreas: No focal abnormality is seen. Spleen: Unremarkable. Adrenals/Urinary Tract: Adrenals are not enlarged. There is no hydronephrosis. There are no renal or ureteral stones. Urinary bladder is not distended. Stomach/Bowel: Stomach is not distended. Small bowel loops are not dilated. Appendix is not dilated. There is no wall thickening in the colon. There are no enhancing lesions in the bowel wall. There is no demonstrable extravasation of contrast in the arterial and portal venous phase images. Lymphatic: No significant lymphadenopathy seen. Reproductive: Unremarkable. Other: There is no ascites or pneumoperitoneum. Musculoskeletal: Unremarkable. IMPRESSION: VASCULAR There is no evidence of aneurysmal dilation or stenosis in the aorta and its major branches. There is no demonstrable active extravasation of contrast. There are no focal enhancing lesions in the bowel wall. NON-VASCULAR Enlarged fatty liver. Status post cholecystectomy. There is no evidence of intestinal obstruction or pneumoperitoneum. There is no hydronephrosis. Electronically Signed   By: Ernie Avena M.D.   On:  01/02/2021 17:36   US Abdomen Limited RUQ (LIVER/GB)  Result Date: 01/02/2021 CLINICAL DATA:  Epigastric pain.  History of cholecystectomy EXAM: ULTRASOUND ABDOMEN LIMITED RIGHT UPPER QUADRANT COMPARISON:  Same day CT FINDINGS: Gallbladder: Surgically absent. 1 cm echogenic, shadowing structure seen in the gallbladder fossa likely reflecting cholecystectomy clips. Common bile duct: Diameter: 12 mm. Liver: No focal lesion identified. Mildly increased hepatic parenchymal echogenicity. Portal vein is patent on color Doppler imaging with normal direction of blood flow towards the liver. Other: None. IMPRESSION: 1. The echogenicity of the liver is increased. This is a nonspecific finding but is most commonly seen with fatty infiltration of the liver. There are no obvious focal liver lesions. 2. Prior cholecystectomy. Electronically Signed   By: Duanne Guess D.O.   On: 01/02/2021 19:35     Today   Subjective    Anna Glover today has no headache,no chest abdominal pain,no new weakness tingling or numbness, feels much better wants to go home today.     Objective   Blood pressure 132/85, pulse (!) 50, temperature 97.7 F (36.5 C), temperature source Temporal, resp. rate 19, height 6' (1.829 m), weight 95.3 kg, SpO2 98 %.   Intake/Output Summary (Last 24 hours) at 01/04/2021 0947 Last data filed at 01/04/2021 0912 Gross per 24 hour  Intake 2466.69 ml  Output --  Net 2466.69 ml    Exam  Awake Alert, No new F.N deficits, Normal affect [place].AT,PERRAL Supple Neck,No JVD, No cervical lymphadenopathy appriciated.  Symmetrical Chest wall movement, Good air movement bilaterally, CTAB RRR,No Gallops,Rubs or new Murmurs, No Parasternal Heave +ve B.Sounds, Abd Soft, Non tender, No organomegaly appriciated, No rebound -guarding or rigidity. No Cyanosis, Clubbing or edema, No new Rash or bruise   Data Review   CBC w Diff:  Lab Results  Component Value Date   WBC 5.1 01/04/2021   HGB 13.2  01/04/2021   HCT 39.8 01/04/2021   PLT 199 01/04/2021   LYMPHOPCT 18 01/02/2021  MONOPCT 7 01/02/2021   EOSPCT 1 01/02/2021   BASOPCT 1 01/02/2021    CMP:  Lab Results  Component Value Date   NA 136 01/04/2021   K 3.6 01/04/2021   CL 104 01/04/2021   CO2 27 01/04/2021   BUN 7 01/04/2021   CREATININE 0.92 01/04/2021   PROT 6.2 (L) 01/04/2021   ALBUMIN 3.4 (L) 01/04/2021   BILITOT 1.1 01/04/2021   ALKPHOS 87 01/04/2021   AST 33 01/04/2021   ALT 46 (H) 01/04/2021  .   Total Time in preparing paper work, data evaluation and todays exam - 35 minutes  Susa Raring M.D on 01/04/2021 at 9:47 AM  [facility]

## 2021-01-05 LAB — ABO/RH: ABO/RH(D): A POS

## 2021-01-06 ENCOUNTER — Encounter (HOSPITAL_COMMUNITY): Payer: Self-pay | Admitting: Gastroenterology

## 2021-01-06 LAB — SURGICAL PATHOLOGY

## 2021-01-07 ENCOUNTER — Encounter: Payer: Self-pay | Admitting: Gastroenterology

## 2021-05-04 ENCOUNTER — Encounter: Payer: Self-pay | Admitting: Orthopaedic Surgery

## 2021-05-04 ENCOUNTER — Ambulatory Visit (INDEPENDENT_AMBULATORY_CARE_PROVIDER_SITE_OTHER): Payer: BC Managed Care – PPO | Admitting: Orthopaedic Surgery

## 2021-05-04 ENCOUNTER — Ambulatory Visit (INDEPENDENT_AMBULATORY_CARE_PROVIDER_SITE_OTHER): Payer: BC Managed Care – PPO

## 2021-05-04 ENCOUNTER — Other Ambulatory Visit: Payer: Self-pay

## 2021-05-04 ENCOUNTER — Telehealth: Payer: Self-pay

## 2021-05-04 ENCOUNTER — Ambulatory Visit: Payer: Self-pay

## 2021-05-04 DIAGNOSIS — M1711 Unilateral primary osteoarthritis, right knee: Secondary | ICD-10-CM

## 2021-05-04 DIAGNOSIS — M1712 Unilateral primary osteoarthritis, left knee: Secondary | ICD-10-CM | POA: Diagnosis not present

## 2021-05-04 DIAGNOSIS — M170 Bilateral primary osteoarthritis of knee: Secondary | ICD-10-CM

## 2021-05-04 MED ORDER — METHYLPREDNISOLONE ACETATE 40 MG/ML IJ SUSP
13.3300 mg | INTRAMUSCULAR | Status: AC | PRN
  Administered 2021-05-04: 13.33 mg via INTRA_ARTICULAR

## 2021-05-04 MED ORDER — BUPIVACAINE HCL 0.25 % IJ SOLN
0.6600 mL | INTRAMUSCULAR | Status: AC | PRN
  Administered 2021-05-04: .66 mL via INTRA_ARTICULAR

## 2021-05-04 MED ORDER — LIDOCAINE HCL 1 % IJ SOLN
3.0000 mL | INTRAMUSCULAR | Status: AC | PRN
  Administered 2021-05-04: 3 mL

## 2021-05-04 MED ORDER — CELECOXIB 200 MG PO CAPS: 200.0000 mg | ORAL_CAPSULE | Freq: Two times a day (BID) | ORAL | 2 refills | Status: DC | PRN

## 2021-05-04 NOTE — Telephone Encounter (Signed)
Noted  

## 2021-05-04 NOTE — Progress Notes (Signed)
? ?Office Visit Note ?  ?Patient: Anna Glover           ?Date of Birth: November 20, 1975           ?MRN: [id number] ?Visit Date: 05/04/2021 ?             ?Requested by: Simona Huh, NP ?[address] ?[place],  [place] [address] ?PCP: Simona Huh, NP ? ? ?Assessment & Plan: ?Visit Diagnoses:  ?1. Primary osteoarthritis of left knee   ?2. Primary osteoarthritis of right knee   ? ? ?Plan: Impression is bilateral knee osteoarthritis.  At this point, we have discussed cortisone injection, viscosupplementation injection as well as total knee arthroplasty.  She would like to repeat cortisone injection to both knees today as well as get approval for viscosupplementation injection as she has had good relief from these in the past.  She will follow-up with [place] once approved.  Call with concerns or questions. ? ?Follow-Up Instructions: Return for once approved for visco injections.  ? ?Orders:  ?Orders Placed This Encounter  ?Procedures  ? Large Joint Inj: bilateral knee  ? XR KNEE 3 VIEW RIGHT  ? XR KNEE 3 VIEW LEFT  ? ?Meds ordered this encounter  ?Medications  ? celecoxib (CELEBREX) 200 MG capsule  ?  Sig: Take 1 capsule (200 mg total) by mouth 2 (two) times daily between meals as needed.  ?  Dispense:  60 capsule  ?  Refill:  2  ? ? ? ? Procedures: ?Large Joint Inj: bilateral knee on 05/04/2021 4:32 PM ?Indications: pain ?Details: 22 G needle, anterolateral approach ?Medications (Right): 0.66 mL bupivacaine 0.25 %; 3 mL lidocaine 1 %; 13.33 mg methylPREDNISolone acetate 40 MG/ML ?Medications (Left): 0.66 mL bupivacaine 0.25 %; 3 mL lidocaine 1 %; 13.33 mg methylPREDNISolone acetate 40 MG/ML ? ? ? ? ?Clinical Data: ?No additional findings. ? ? ?Subjective: ?Chief Complaint  ?Patient presents with  ? Left Knee - Pain  ? Right Knee - Pain  ? ? ?HPI patient is a very pleasant 46 year old female who comes in today with bilateral knee pain right greater than left.  She has an underlying history of osteoarthritis to both knees.   She underwent cortisone as well as viscosupplementation injections to both knees back in 2020.  She did well with these injections for a long period of time.  She began having increased pain to the left knee with subsequent MRI finding showing medial meniscus tear.  She underwent left knee arthroscopy with which helped the medial sided knee pain.  It was noted during operative mention she had grade 3 changes to the medial compartment as well as grade 4 changes to the patellofemoral compartment.  The pain started to recur last year.  She underwent left knee cortisone injection back in September without relief.  The pain is now progressed to the point where she has pain anytime she is from a seated to standing position as well as with any activity to include walking.  She notes that her right knee began bothering her a few weeks ago after dancing at a wedding.  The pain is to the lateral patella and is worse with going from a seated to standing position.  She notes stabbing pains with associated popping.  Over-the-counter pain medication does not seem to help. ? ?Review of Systems as detailed in HPI.  All others reviewed and are negative. ? ? ?Objective: ?Vital Signs: There were no vitals taken for this visit. ? ?Physical Exam well-developed  well-nourished female no acute distress.  Alert and oriented x3. ? ?Ortho Exam bilateral knee exam shows range of motion from 0 to 120 degrees.  She has a small effusion on both sides.  No joint line tenderness.  She does have moderate tenderness to the lateral patella facet of the right knee.  Moderate patellofemoral crepitus both sides.  She is neurovascular intact distally. ? ?Specialty Comments:  ?No specialty comments available. ? ?Imaging: ?XR KNEE 3 VIEW LEFT ? ?Result Date: 05/04/2021 ?X-rays demonstrate moderate degenerative changes to the medial and patellofemoral compartments ? ?XR KNEE 3 VIEW RIGHT ? ?Result Date: 05/04/2021 ?X-rays demonstrate moderate degenerative  changes to the medial and patellofemoral compartments  ? ? ?PMFS History: ?Patient Active Problem List  ? Diagnosis Date Noted  ? Gastritis and gastroduodenitis   ? Acute esophagitis   ? RUQ pain   ? Alcohol dependence syndrome ([place]) 01/03/2021  ? Essential hypertension 01/03/2021  ? Hypothyroidism (acquired) 01/03/2021  ? Depression with anxiety 01/03/2021  ? Hematemesis 01/02/2021  ? Acute medial meniscus tear of left knee 06/30/2020  ? Primary osteoarthritis of right knee 03/31/2020  ? Primary osteoarthritis of left knee 03/31/2020  ? Headache 06/03/2014  ? 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 Thrombocytopenia ([place]) 06/03/2014  ? Elevated LFTs 06/03/2014  ? ?Past Medical History:  ?Diagnosis Date  ? Anxiety   ? Depression   ? Hypertension   ? Hypothyroidism   ?  ?Family History  ?Problem Relation Age of Onset  ? Hypertension Mother   ? Hyperlipidemia Mother   ? Hypertension Father   ? Heart disease Father   ? Pulmonary fibrosis Father   ? Migraines Neg Hx   ?  ?Past Surgical History:  ?Procedure Laterality Date  ? BIOPSY  01/04/2021  ? Procedure: BIOPSY;  Surgeon: Thornton Park, MD;  Location: [place];  Service: Gastroenterology;;  ? CESAREAN SECTION  02/25/2003  ? CESAREAN SECTION  05/01/2009  ? CHOLECYSTECTOMY  June 2014  ? ESOPHAGOGASTRODUODENOSCOPY (EGD) WITH PROPOFOL N/A 01/04/2021  ? Procedure: ESOPHAGOGASTRODUODENOSCOPY (EGD) WITH PROPOFOL;  Surgeon: Thornton Park, MD;  Location: [facility];  Service: Gastroenterology;  Laterality: N/A;  ? ?Social History  ? ?Occupational History  ? Occupation: Oncologist  ?Tobacco Use  ? Smoking status: Former  ?  Years: 10.00  ?  Types: Cigarettes  ?  Quit date: 07/10/2009  ?  Years since quitting: 11.8  ? Smokeless tobacco: Never  ?Vaping Use  ? Vaping Use: Never used  ?Substance and Sexual Activity  ? Alcohol use: Yes  ?  Alcohol/week: 1.0 standard drink  ?  Types: 1 Glasses of wine per week  ?  Comment: "too much" - a bottle of wine a night   ? Drug use: No  ? Sexual activity: Not on file  ? ? ? ? ? ? ?

## 2021-05-04 NOTE — Telephone Encounter (Signed)
Please submit for bil gel injections. ? ?

## 2021-07-23 ENCOUNTER — Other Ambulatory Visit: Payer: Self-pay | Admitting: Physician Assistant

## 2021-10-26 ENCOUNTER — Telehealth: Payer: Self-pay

## 2021-10-26 ENCOUNTER — Ambulatory Visit: Payer: BC Managed Care – PPO | Admitting: Orthopaedic Surgery

## 2021-10-26 ENCOUNTER — Encounter: Payer: Self-pay | Admitting: Orthopaedic Surgery

## 2021-10-26 ENCOUNTER — Ambulatory Visit (INDEPENDENT_AMBULATORY_CARE_PROVIDER_SITE_OTHER): Payer: BC Managed Care – PPO

## 2021-10-26 VITALS — Ht 71.0 in | Wt 229.0 lb

## 2021-10-26 DIAGNOSIS — M1712 Unilateral primary osteoarthritis, left knee: Secondary | ICD-10-CM

## 2021-10-26 NOTE — Progress Notes (Signed)
Office Visit Note   Patient: Anna Glover           Date of Birth: 06-04-1975           MRN: [id number] Visit Date: 10/26/2021              Requested by: Courtney Paris, NP [phone] [place],  [place] [address] PCP: Courtney Paris, NP   Assessment & Plan: Visit Diagnoses:  1. Primary osteoarthritis of left knee     Plan: Impression is chondromalacia of the left knee most severe in the medial compartment.  Treatment options were reviewed and given her age and activity level I think it is best to try to continue conservative management.  We agreed to try an OA reaction brace and resubmit for approval for Visco.  Follow-Up Instructions: No follow-ups on file.   Orders:  Orders Placed This Encounter  Procedures   XR Knee Complete 4 Views Left   No orders of the defined types were placed in this encounter.     Procedures: No procedures performed   Clinical Data: No additional findings.   Subjective: Chief Complaint  Patient presents with   Left Knee - Pain    HPI [name] returns today for continued severe left knee pain.  She is having trouble with daily activities such as walking.  Has a lot of trouble with kneeling and with sit to stand transition.  Review of Systems   Objective: Vital Signs: Ht 5\' 11"  (1.803 m)   Wt 229 lb (103.9 kg)   BMI 31.94 kg/m   Physical Exam  Ortho Exam  Examination of the left knee shows no joint effusion.  Medial joint line tenderness and tenderness along the medial tibial plateau.  Specialty Comments:  No specialty comments available.  Imaging: XR Knee Complete 4 Views Left  Result Date: 10/26/2021 Well-preserved joint spaces.  Spurring of the medial compartment and around the periphery of the patella.    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   Thrombocytopenia (HCC) 06/03/2014   Elevated LFTs 06/03/2014   Past Medical History:  Diagnosis Date   Anxiety    Depression    Hypertension    Hypothyroidism     Family History  Problem Relation Age of Onset   Hypertension Mother    Hyperlipidemia Mother    Hypertension Father    Heart disease Father    Pulmonary fibrosis Father    Migraines Neg Hx     Past Surgical History:  Procedure Laterality Date   BIOPSY  01/04/2021   Procedure: BIOPSY;  Surgeon: 01/06/2021, MD;  Location: [facility] ENDOSCOPY;  Service: Gastroenterology;;   CESAREAN SECTION  02/25/2003   CESAREAN SECTION  05/01/2009   CHOLECYSTECTOMY  June 2014   ESOPHAGOGASTRODUODENOSCOPY (EGD) WITH PROPOFOL N/A 01/04/2021   Procedure: ESOPHAGOGASTRODUODENOSCOPY (EGD) WITH PROPOFOL;  Surgeon: 01/06/2021, MD;  Location: [facility] ENDOSCOPY;  Service: Gastroenterology;  Laterality: N/A;   Social History   Occupational History   Occupation: Kindergarten  Teacher  Tobacco Use   Smoking status: Former    Years: 10.00    Types: Cigarettes    Quit date: 07/10/2009    Years since quitting: 12.3   Smokeless tobacco: Never  Vaping Use   Vaping Use: Never used  Substance and Sexual Activity   Alcohol use: Yes    Alcohol/week: 1.0 standard drink of alcohol    Types: 1 Glasses of wine per week    Comment: "too much" - a bottle of wine a night   Drug use: No   Sexual activity: Not on file

## 2021-10-26 NOTE — Telephone Encounter (Signed)
Patient was here for appt with Roda Shutters and wanted follow up on gel auth from March

## 2021-10-27 NOTE — Telephone Encounter (Signed)
Called and left a VM for patient to CB to schedule for gel injection.

## 2021-10-27 NOTE — Telephone Encounter (Signed)
VOB submitted for SynviscOne, bilateral knee  

## 2021-11-02 ENCOUNTER — Telehealth: Payer: Self-pay

## 2021-11-02 NOTE — Telephone Encounter (Signed)
PA submitted online through Covermymeds for SynviscOne, bilateral knee. Pending PA# [id number]

## 2021-11-04 ENCOUNTER — Ambulatory Visit: Payer: BC Managed Care – PPO | Admitting: Orthopaedic Surgery

## 2021-11-04 ENCOUNTER — Encounter: Payer: Self-pay | Admitting: Orthopaedic Surgery

## 2021-11-04 DIAGNOSIS — M1711 Unilateral primary osteoarthritis, right knee: Secondary | ICD-10-CM | POA: Diagnosis not present

## 2021-11-04 DIAGNOSIS — M170 Bilateral primary osteoarthritis of knee: Secondary | ICD-10-CM | POA: Diagnosis not present

## 2021-11-04 DIAGNOSIS — M1712 Unilateral primary osteoarthritis, left knee: Secondary | ICD-10-CM | POA: Diagnosis not present

## 2021-11-04 MED ORDER — HYLAN G-F 20 48 MG/6ML IX SOSY
48.0000 mg | PREFILLED_SYRINGE | INTRA_ARTICULAR | Status: AC | PRN
  Administered 2021-11-04: 48 mg via INTRA_ARTICULAR

## 2021-11-04 NOTE — Progress Notes (Signed)
Office Visit Note   Patient: Anna Glover           Date of Birth: March 19, 1975           MRN: [id number] Visit Date: 11/04/2021              Requested by: Courtney Paris, NP [phone] [address] [place],  [place] [address] PCP: Courtney Paris, NP   Assessment & Plan: Visit Diagnoses:  1. Bilateral primary osteoarthritis of knee     Plan: Impression is bilateral knee osteoarthritis.  Today, we proceeded with bilateral knee Synvisc 1 injections.  She tolerated these well.  She will follow-up with [place] as needed.  Call with concerns or questions. Bilateral knee Synvisc 1 injections: Lot number [id number]     expiration date 05/14/2024  Follow-Up Instructions: Return if symptoms worsen or fail to improve.   Orders:  No orders of the defined types were placed in this encounter.  No orders of the defined types were placed in this encounter.     Procedures: Large Joint Inj: bilateral knee on 11/04/2021 4:42 PM Indications: pain Details: 22 G needle  Arthrogram: No  Medications (Right): 48 mg Hylan 48 MG/6ML Medications (Left): 48 mg Hylan 48 MG/6ML Outcome: tolerated well, no immediate complications Patient was prepped and draped in the usual sterile fashion.       Clinical Data: No additional findings.   Subjective: Chief Complaint  Patient presents with   Right Knee - Follow-up    Synvisc One   Left Knee - Follow-up    Synvisc One    HPI patient is a pleasant 46 year old female with underlying bilateral knee osteoarthritis who comes in today for repeat bilateral knee Visco supplementation injections.  She has had these in the past with good relief.     Objective: Vital Signs: There were no vitals taken for this visit.    Ortho Exam unchanged bilateral knee exam  Specialty Comments:  No specialty comments available.  Imaging: No new imaging   PMFS History: Patient Active Problem List   Diagnosis Date Noted   Gastritis and gastroduodenitis    Acute  esophagitis    RUQ pain    Alcohol dependence syndrome (HCC) 01/03/2021   Essential hypertension 01/03/2021   Hypothyroidism (acquired) 01/03/2021   Depression with anxiety 01/03/2021   Hematemesis 01/02/2021   Acute medial meniscus tear of left knee 06/30/2020   Primary osteoarthritis of right knee 03/31/2020   Primary osteoarthritis of left knee 03/31/2020   Headache 06/03/2014   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   Thrombocytopenia (HCC) 06/03/2014   Elevated LFTs 06/03/2014   Past Medical History:  Diagnosis Date   Anxiety    Depression    Hypertension    Hypothyroidism     Family History  Problem Relation Age of Onset   Hypertension Mother    Hyperlipidemia Mother    Hypertension Father    Heart disease Father    Pulmonary fibrosis Father    Migraines Neg Hx     Past Surgical History:  Procedure Laterality Date   BIOPSY  01/04/2021   Procedure: BIOPSY;  Surgeon: Tressia Danas, MD;  Location: [facility] ENDOSCOPY;  Service: Gastroenterology;;   CESAREAN SECTION  02/25/2003   CESAREAN SECTION  05/01/2009   CHOLECYSTECTOMY  June 2014   ESOPHAGOGASTRODUODENOSCOPY (EGD) WITH PROPOFOL N/A 01/04/2021   Procedure: ESOPHAGOGASTRODUODENOSCOPY (EGD) WITH PROPOFOL;  Surgeon: Tressia Danas, MD;  Location: [facility] ENDOSCOPY;  Service: Gastroenterology;  Laterality: N/A;  Social History   Occupational History   Occupation: Oncologist  Tobacco Use   Smoking status: Former    Years: 10.00    Types: Cigarettes    Quit date: 07/10/2009    Years since quitting: 12.3   Smokeless tobacco: Never  Vaping Use   Vaping Use: Never used  Substance and Sexual Activity   Alcohol use: Yes    Alcohol/week: 1.0 standard drink of alcohol    Types: 1 Glasses of wine per week    Comment: "too much" - a bottle of wine a night   Drug use: No   Sexual activity: Not on file

## 2021-11-18 ENCOUNTER — Other Ambulatory Visit: Payer: Self-pay

## 2021-11-18 DIAGNOSIS — M170 Bilateral primary osteoarthritis of knee: Secondary | ICD-10-CM

## 2021-12-29 ENCOUNTER — Telehealth: Payer: Self-pay | Admitting: Physical Medicine and Rehabilitation

## 2021-12-29 NOTE — Telephone Encounter (Signed)
Pt returned call to set a referral appt. Pt phone number is [phone].

## 2021-12-30 NOTE — Telephone Encounter (Signed)
I spoke with patient. She stated that she was actually wanting a second opinion for her knee. She stated that she sees Dr Roda Shutters and her husband sees Dr August Saucer. She requested appt with August Saucer and this was scheduled.

## 2021-12-30 NOTE — Telephone Encounter (Signed)
Tried calling, no answer. See notes in referral we had tried to call patient 2 other times. Waiting for return call from patient.    

## 2022-01-19 ENCOUNTER — Ambulatory Visit: Payer: BC Managed Care – PPO | Admitting: Orthopedic Surgery

## 2022-01-23 ENCOUNTER — Telehealth: Payer: BC Managed Care – PPO | Admitting: Physician Assistant

## 2022-01-23 DIAGNOSIS — B999 Unspecified infectious disease: Secondary | ICD-10-CM | POA: Diagnosis not present

## 2022-01-23 DIAGNOSIS — U071 COVID-19: Secondary | ICD-10-CM | POA: Diagnosis not present

## 2022-01-23 DIAGNOSIS — K047 Periapical abscess without sinus: Secondary | ICD-10-CM

## 2022-01-23 MED ORDER — NIRMATRELVIR/RITONAVIR (PAXLOVID)TABLET: 3.0000 | ORAL_TABLET | Freq: Two times a day (BID) | ORAL | 0 refills | Status: AC

## 2022-01-23 MED ORDER — DOXYCYCLINE HYCLATE 100 MG PO TABS: 100.0000 mg | ORAL_TABLET | Freq: Two times a day (BID) | ORAL | 0 refills | Status: DC

## 2022-01-23 NOTE — Progress Notes (Signed)
Virtual Visit Consent   Anna Glover, you are scheduled for a virtual visit with a [facility] provider today. Just as with appointments in the office, your consent must be obtained to participate. Your consent will be active for this visit and any virtual visit you may have with one of our providers in the next 365 days. If you have a MyChart account, a copy of this consent can be sent to you electronically.  As this is a virtual visit, video technology does not allow for your provider to perform a traditional examination. This may limit your provider's ability to fully assess your condition. If your provider identifies any concerns that need to be evaluated in person or the need to arrange testing (such as labs, EKG, etc.), we will make arrangements to do so. Although advances in technology are sophisticated, we cannot ensure that it will always work on either your end or our end. If the connection with a video visit is poor, the visit may have to be switched to a telephone visit. With either a video or telephone visit, we are not always able to ensure that we have a secure connection.  By engaging in this virtual visit, you consent to the provision of healthcare and authorize for your insurance to be billed (if applicable) for the services provided during this visit. Depending on your insurance coverage, you may receive a charge related to this service.  I need to obtain your verbal consent now. Are you willing to proceed with your visit today? Anna Glover has provided verbal consent on 01/23/2022 for a virtual visit (video or telephone). Margaretann Loveless, PA-C  Date: 01/23/2022 1:28 PM  Virtual Visit via Video Note   I, Margaretann Loveless, connected with  Anna Glover  ([id number], 10-03-1975) on 01/23/22 at  1:15 PM EST by a video-enabled telemedicine application and verified that I am speaking with the correct person using two identifiers.  Location: Patient: Virtual Visit  Location Patient: Home Provider: Virtual Visit Location Provider: Home Office   I discussed the limitations of evaluation and management by telemedicine and the availability of in person appointments. The patient expressed understanding and agreed to proceed.    History of Present Illness: Anna Glover is a 46 y.o. who identifies as a female who was assigned female at birth, and is being seen today for Covid 19 and possible secondary dental infection.  HPI: URI  This is a new problem. The current episode started in the past 7 days (Tested positive for Covid 19 and symptoms started on Thursday, 01/20/22). The problem has been gradually worsening. Associated symptoms include congestion, coughing and headaches. Associated symptoms comments: Fatigue, sweats, menstrual bleeding. She has tried NSAIDs (ibuprofen) for the symptoms. The treatment provided no relief.  Dental Pain  This is a new problem. Episode onset: had tooth extraction on Wednesday, 01/19/22. The problem occurs constantly. The problem has been gradually worsening. Associated symptoms include facial pain, a fever and thermal sensitivity. Pertinent negatives include no difficulty swallowing or sinus pressure. Associated symptoms comments: Right jaw pain, right ear pain, bad taste in mouth making her nauseated, reports socket looks white and green. She has tried nothing for the symptoms. The treatment provided no relief.     Problems:  Patient Active Problem List   Diagnosis Date Noted   Gastritis and gastroduodenitis    Acute esophagitis    RUQ pain    Alcohol dependence syndrome (HCC) 01/03/2021   Essential hypertension 01/03/2021  Hypothyroidism (acquired) 01/03/2021   Depression with anxiety 01/03/2021   Hematemesis 01/02/2021   Acute medial meniscus tear of left knee 06/30/2020   Primary osteoarthritis of right knee 03/31/2020   Primary osteoarthritis of left knee 03/31/2020   Headache 06/03/2014   [facility] spotted  fever 06/03/2014   Thrombocytopenia (HCC) 06/03/2014   Elevated LFTs 06/03/2014    Allergies:  Allergies  Allergen Reactions   Cefdinir Other (See Comments)    Rectal bleeding   Elemental Sulfur Other (See Comments)    Rectal bleeding   Erythromycin Nausea And Vomiting   Penicillins Hives   Clindamycin/Lincomycin Nausea And Vomiting   Medications:  Current Outpatient Medications:    doxycycline (VIBRA-TABS) 100 MG tablet, Take 1 tablet (100 mg total) by mouth 2 (two) times daily., Disp: 20 tablet, Rfl: 0   nirmatrelvir/ritonavir EUA (PAXLOVID) 20 x 150 MG & 10 x 100MG  TABS, Take 3 tablets by mouth 2 (two) times daily for 5 days. (Take nirmatrelvir 150 mg two tablets twice daily for 5 days and ritonavir 100 mg one tablet twice daily for 5 days) Patient GFR is greater than 60, Disp: 30 tablet, Rfl: 0   amLODipine (NORVASC) 10 MG tablet, Take 10 mg by mouth at bedtime., Disp: , Rfl:    celecoxib (CELEBREX) 200 MG capsule, Take 1 capsule (200 mg total) by mouth 2 (two) times daily between meals as needed., Disp: 60 capsule, Rfl: 2   levothyroxine (SYNTHROID, LEVOTHROID) 137 MCG tablet, Take 137 mcg by mouth daily., Disp: , Rfl:    Loratadine 10 MG CAPS, Take 10 mg by mouth at bedtime., Disp: , Rfl:    ondansetron (ZOFRAN) 4 MG tablet, Take 1 tablet (4 mg total) by mouth every 8 (eight) hours as needed for nausea or vomiting., Disp: 20 tablet, Rfl: 0   pantoprazole (PROTONIX) 40 MG tablet, Take 1 tablet (40 mg total) by mouth 2 (two) times daily., Disp: 60 tablet, Rfl: 0   sertraline (ZOLOFT) 50 MG tablet, Take 50 mg by mouth at bedtime., Disp: , Rfl:   Observations/Objective: Patient is well-developed, well-nourished in no acute distress.  Resting comfortably at home.  Head is normocephalic, atraumatic.  No labored breathing.  Speech is clear and coherent with logical content.  Patient is alert and oriented at baseline.    Assessment and Plan: 1. COVID-19 - nirmatrelvir/ritonavir  EUA (PAXLOVID) 20 x 150 MG & 10 x 100MG  TABS; Take 3 tablets by mouth 2 (two) times daily for 5 days. (Take nirmatrelvir 150 mg two tablets twice daily for 5 days and ritonavir 100 mg one tablet twice daily for 5 days) Patient GFR is greater than 60  Dispense: 30 tablet; Refill: 0  2. Superimposed infection - doxycycline (VIBRA-TABS) 100 MG tablet; Take 1 tablet (100 mg total) by mouth 2 (two) times daily.  Dispense: 20 tablet; Refill: 0  3. Dental infection - doxycycline (VIBRA-TABS) 100 MG tablet; Take 1 tablet (100 mg total) by mouth 2 (two) times daily.  Dispense: 20 tablet; Refill: 0  - Continue OTC symptomatic management of choice - Will send OTC vitamins and supplement information through AVS - Paxlovid prescribed - Doxycycline prescribed for suspected superimposed dental infection of recent tooth extraction - Continue alternating Ibuprofen and tylenol - Discussed orajel for tooth - Consider adding ferous sulfate 325mg  for heavy menstrual cycle - Patient enrolled in MyChart symptom monitoring - Push fluids - Rest as needed - Discussed return precautions and when to seek in-person evaluation, sent via AVS as  well   Follow Up Instructions: I discussed the assessment and treatment plan with the patient. The patient was provided an opportunity to ask questions and all were answered. The patient agreed with the plan and demonstrated an understanding of the instructions.  A copy of instructions were sent to the patient via MyChart unless otherwise noted below.    The patient was advised to call back or seek an in-person evaluation if the symptoms worsen or if the condition fails to improve as anticipated.  Time:  I spent 15 minutes with the patient via telehealth technology discussing the above problems/concerns.    Margaretann Loveless, PA-C

## 2022-01-23 NOTE — Patient Instructions (Signed)
Anna Glover, thank you for joining Mar Daring, PA-C for today's virtual visit.  While this provider is not your primary care provider (PCP), if your PCP is located in our provider database this encounter information will be shared with them immediately following your visit.   Anna Glover account gives you access to today's visit and all your visits, tests, and labs performed at [facility] " click here if you don't have a [facility] account or go to [email or web address]  Consent: (Patient) Anna Glover provided verbal consent for this virtual visit at the beginning of the encounter.  Current Medications:  Current Outpatient Medications:    doxycycline (VIBRA-TABS) 100 MG tablet, Take 1 tablet (100 mg total) by mouth 2 (two) times daily., Disp: 20 tablet, Rfl: 0   nirmatrelvir/ritonavir EUA (PAXLOVID) 20 x 150 MG & 10 x 100MG  TABS, Take 3 tablets by mouth 2 (two) times daily for 5 days. (Take nirmatrelvir 150 mg two tablets twice daily for 5 days and ritonavir 100 mg one tablet twice daily for 5 days) Patient GFR is greater than 60, Disp: 30 tablet, Rfl: 0   amLODipine (NORVASC) 10 MG tablet, Take 10 mg by mouth at bedtime., Disp: , Rfl:    celecoxib (CELEBREX) 200 MG capsule, Take 1 capsule (200 mg total) by mouth 2 (two) times daily between meals as needed., Disp: 60 capsule, Rfl: 2   levothyroxine (SYNTHROID, LEVOTHROID) 137 MCG tablet, Take 137 mcg by mouth daily., Disp: , Rfl:    Loratadine 10 MG CAPS, Take 10 mg by mouth at bedtime., Disp: , Rfl:    ondansetron (ZOFRAN) 4 MG tablet, Take 1 tablet (4 mg total) by mouth every 8 (eight) hours as needed for nausea or vomiting., Disp: 20 tablet, Rfl: 0   pantoprazole (PROTONIX) 40 MG tablet, Take 1 tablet (40 mg total) by mouth 2 (two) times daily., Disp: 60 tablet, Rfl: 0   sertraline (ZOLOFT) 50 MG tablet, Take 50 mg by mouth at bedtime., Disp: , Rfl:    Medications ordered in this  encounter:  Meds ordered this encounter  Medications   nirmatrelvir/ritonavir EUA (PAXLOVID) 20 x 150 MG & 10 x 100MG  TABS    Sig: Take 3 tablets by mouth 2 (two) times daily for 5 days. (Take nirmatrelvir 150 mg two tablets twice daily for 5 days and ritonavir 100 mg one tablet twice daily for 5 days) Patient GFR is greater than 60    Dispense:  30 tablet    Refill:  0    Order Specific Question:   Supervising Provider    Answer:   Chase Picket [id number]   doxycycline (VIBRA-TABS) 100 MG tablet    Sig: Take 1 tablet (100 mg total) by mouth 2 (two) times daily.    Dispense:  20 tablet    Refill:  0    Order Specific Question:   Supervising Provider    Answer:   Chase Picket [id number]     *If you need refills on other medications prior to your next appointment, please contact your pharmacy*  Follow-Up: Call back or seek an in-person evaluation if the symptoms worsen or if the condition fails to improve as anticipated.  [place] [phone]  Other Instructions COVID-19 COVID-19, or coronavirus disease 2019, is an infection that is caused by a new (novel) coronavirus called SARS-CoV-2. COVID-19 can cause many symptoms. In some people, the virus may not cause any symptoms. In others,  it may cause mild or severe symptoms. Some people with severe infection develop severe disease. What are the causes? This illness is caused by a virus. The virus may be in the air as tiny specks of fluid (aerosols) or droplets, or it may be on surfaces. You may catch the virus by: Breathing in droplets from an infected person. Droplets can be spread by a person breathing, speaking, singing, coughing, or sneezing. Touching something, like a table or a doorknob, that has virus on it (is contaminated) and then touching your mouth, nose, or eyes. What increases the risk? Risk for infection: You are more likely to get infected with the COVID-19 virus if: You are within 6 ft (1.8 m)  of a person with COVID-19 for 15 minutes or longer. You are providing care for a person who is infected with COVID-19. You are in close personal contact with other people. Close personal contact includes hugging, kissing, or sharing eating or drinking utensils. Risk for serious illness caused by COVID-19: You are more likely to get seriously ill from the COVID-19 virus if: You have cancer. You have a long-term (chronic) disease, such as: Chronic lung disease. This includes pulmonary embolism, chronic obstructive pulmonary disease, and cystic fibrosis. Long-term disease that lowers your body's ability to fight infection (immunocompromise). Serious cardiac conditions, such as heart failure, coronary artery disease, or cardiomyopathy. Diabetes. Chronic kidney disease. Liver diseases. These include cirrhosis, nonalcoholic fatty liver disease, alcoholic liver disease, or autoimmune hepatitis. You have obesity. You are pregnant or were recently pregnant. You have sickle cell disease. What are the signs or symptoms? Symptoms of this condition can range from mild to severe. Symptoms may appear any time from 2 to 14 days after being exposed to the virus. They include: Fever or chills. Shortness of breath or trouble breathing. Feeling tired or very tired. Headaches, body aches, or muscle aches. Runny or stuffy nose, sneezing, coughing, or sore throat. New loss of taste or smell. This is rare. Some people may also have stomach problems, such as nausea, vomiting, or diarrhea. Other people may not have any symptoms of COVID-19. How is this diagnosed? This condition may be diagnosed by testing samples to check for the COVID-19 virus. The most common tests are the PCR test and the antigen test. Tests may be done in the lab or at home. They include: Using a swab to take a sample of fluid from the back of your nose and throat (nasopharyngeal fluid), from your nose, or from your throat. Testing a sample  of saliva from your mouth. Testing a sample of coughed-up mucus from your lungs (sputum). How is this treated? Treatment for COVID-19 infection depends on the severity of the condition. Mild symptoms can be managed at home with rest, fluids, and over-the-counter medicines. Serious symptoms may be treated in a hospital intensive care unit (ICU). Treatment in the ICU may include: Supplemental oxygen. Extra oxygen is given through a tube in the nose, a face mask, or a hood. Medicines. These may include: Antivirals, such as monoclonal antibodies. These help your body fight off certain viruses that can cause disease. Anti-inflammatories, such as corticosteroids. These reduce inflammation and suppress the immune system. Antithrombotics. These prevent or treat blood clots, if they develop. Convalescent plasma. This helps boost your immune system, if you have an underlying immunosuppressive condition or are getting immunosuppressive treatments. Prone positioning. This means you will lie on your stomach. This helps oxygen to get into your lungs. Infection control measures. If you are  at risk for more serious illness caused by COVID-19, your health care provider may prescribe two long-acting monoclonal antibodies, given together every 6 months. How is this prevented? To protect yourself: Use preventive medicine (pre-exposure prophylaxis). You may get pre-exposure prophylaxis if you have moderate or severe immunocompromise. Get vaccinated. Anyone 22 months old or older who meets guidelines can get a COVID-19 vaccine or vaccine series. This includes people who are pregnant or making breast milk (lactating). Get an added dose of COVID-19 vaccine after your first vaccine or vaccine series if you have moderate to severe immunocompromise. This applies if you have had a solid organ transplant or have been diagnosed with an immunocompromising condition. You should get the added dose 4 weeks after you got the first  COVID-19 vaccine or vaccine series. If you get an mRNA vaccine, you will need a 3-dose primary series. If you get the J&J/Janssen vaccine, you will need a 2-dose primary series, with the second dose being an mRNA vaccine. Talk to your health care provider about getting experimental monoclonal antibodies. This treatment is approved under emergency use authorization to prevent severe illness before or after being exposed to the COVID-19 virus. You may be given monoclonal antibodies if: You have moderate or severe immunocompromise. This includes treatments that lower your immune response. People with immunocompromise may not develop protection against COVID-19 when they are vaccinated. You cannot be vaccinated. You may not get a vaccine if you have a severe allergic reaction to the vaccine or its components. You are not fully vaccinated. You are in a facility where COVID-19 is present and: Are in close contact with a person who is infected with the COVID-19 virus. Are at high risk of being exposed to the COVID-19 virus. You are at risk of illness from new variants of the COVID-19 virus. To protect others: If you have symptoms of COVID-19, take steps to prevent the virus from spreading to others. Stay home. Leave your house only to get medical care. Do not use public transit, if possible. Do not travel while you are sick. Wash your hands often with soap and water for at least 20 seconds. If soap and water are not available, use alcohol-based hand sanitizer. Make sure that all people in your household wash their hands well and often. Cough or sneeze into a tissue or your sleeve or elbow. Do not cough or sneeze into your hand or into the air. Where to find more information Centers for Disease Control and Prevention: https://www.clark-whitaker.org/ World Health Organization: https://thompson-craig.com/ Get help right away if: You have trouble breathing. You have pain or pressure in your chest. You  are confused. You have bluish lips and fingernails. You have trouble waking from sleep. You have symptoms that get worse. These symptoms may be an emergency. Get help right away. Call 911. Do not wait to see if the symptoms will go away. Do not drive yourself to the hospital. Summary COVID-19 is an infection that is caused by a new coronavirus. Sometimes, there are no symptoms. Other times, symptoms range from mild to severe. Some people with a severe COVID-19 infection develop severe disease. The virus that causes COVID-19 can spread from person to person through droplets or aerosols from breathing, speaking, singing, coughing, or sneezing. Mild symptoms of COVID-19 can be managed at home with rest, fluids, and over-the-counter medicines. This information is not intended to replace advice given to you by your health care provider. Make sure you discuss any questions you have with your health  care provider. Document Revised: 01/19/2021 Document Reviewed: 01/21/2021 Elsevier Patient Education  [place]; Ritonavir Tablets What is this medication? NIRMATRELVIR; RITONAVIR (NIR ma TREL vir; ri TOE na veer) treats mild to moderate COVID-19. It may help people who are at high risk of developing severe illness. It works by limiting the spread of the virus in your body. This medicine may be used for other purposes; ask your health care provider or pharmacist if you have questions. COMMON BRAND NAME(S): PAXLOVID What should I tell my care team before I take this medication? They need to know if you have any of these conditions: Any allergies Any serious illness Kidney disease Liver disease An unusual or allergic reaction to nirmatrelvir, ritonavir, other medications, foods, dyes, or preservatives Pregnant or trying to get pregnant Breast-feeding How should I use this medication? This product contains 2 different medications that are packaged together. For the  standard dose, take 2 pink tablets of nirmatrelvir with 1 white tablet of ritonavir (3 tablets total) by mouth with water twice daily. Talk to your care team if you have kidney disease. You may need a different dose. Swallow the tablets whole. You can take it with or without food. If it upsets your stomach, take it with food. Take all of this medication unless your care team tells you to stop it early. Keep taking it even if you think you are better. Talk to your care team about the use of this medication in children. While it may be prescribed for children as young as 12 years for selected conditions, precautions do apply. Overdosage: If you think you have taken too much of this medicine contact a poison control center or emergency room at once. NOTE: This medicine is only for you. Do not share this medicine with others. What if I miss a dose? If you miss a dose, take it as soon as you can unless it is more than 8 hours late. If it is more than 8 hours late, skip the missed dose. Take the next dose at the normal time. Do not take extra or 2 doses at the same time to make up for the missed dose. What may interact with this medication? Do not take this medication with any of the following medications: Alfuzosin Certain medications for anxiety or sleep like midazolam, triazolam Certain medications for cancer like apalutamide, enzalutamide Certain medications for cholesterol like lovastatin, simvastatin Certain medications for irregular heart beat like amiodarone, dronedarone, flecainide, propafenone, quinidine Certain medications for pain like meperidine, piroxicam Certain medications for psychotic disorders like clozapine, lurasidone, pimozide Certain medications for seizures like carbamazepine, phenobarbital, phenytoin Colchicine Eletriptan Eplerenone Ergot alkaloids like dihydroergotamine, ergonovine, ergotamine,  methylergonovine Finerenone Flibanserin Ivabradine Lomitapide Naloxegol Ranolazine Rifampin Sildenafil Silodosin St. John's Wort Tolvaptan Ubrogepant Voclosporin This medication may also interact with the following medications: Bedaquiline Birth control pills Bosentan Certain antibiotics like erythromycin or clarithromycin Certain medications for blood pressure like amlodipine, diltiazem, felodipine, nicardipine, nifedipine Certain medications for cancer like abemaciclib, ceritinib, dasatinib, encorafenib, ibrutinib, ivosidenib, neratinib, nilotinib, venetoclax, vinblastine, vincristine Certain medications for cholesterol like atorvastatin, rosuvastatin Certain medications for depression like bupropion, trazodone Certain medications for fungal infections like isavuconazonium, itraconazole, ketoconazole, voriconazole Certain medications for hepatitis C like elbasvir; grazoprevir, dasabuvir; ombitasvir; paritaprevir; ritonavir, glecaprevir; pibrentasvir, sofosbuvir; velpatasvir; voxilaprevir Certain medications for HIV or AIDS Certain medications for irregular heartbeat like lidocaine Certain medications that treat or prevent blood clots like rivaroxaban, warfarin Digoxin Fentanyl Medications that lower your chance of fighting infection  like cyclosporine, sirolimus, tacrolimus Methadone Quetiapine Rifabutin Salmeterol Steroid medications like betamethasone, budesonide, ciclesonide, dexamethasone, fluticasone, methylprednisolone, mometasone, triamcinolone This list may not describe all possible interactions. Give your health care provider a list of all the medicines, herbs, non-prescription drugs, or dietary supplements you use. Also tell them if you smoke, drink alcohol, or use illegal drugs. Some items may interact with your medicine. What should I watch for while using this medication? Your condition will be monitored carefully while you are receiving this medication. Visit your  care team for regular checkups. Tell your care team if your symptoms do not start to get better or if they get worse. If you have untreated HIV infection, this medication may lead to some HIV medications not working as well in the future. Estrogen and progestin hormones may not work as well while you are taking this medication. Your care team can help you find the contraceptive option that works for you. What side effects may I notice from receiving this medication? Side effects that you should report to your care team as soon as possible: Allergic reactions--skin rash, itching, hives, swelling of the face, lips, tongue, or throat Liver injury--right upper belly pain, loss of appetite, nausea, light-colored stool, dark yellow or brown urine, yellowing skin or eyes, unusual weakness or fatigue Redness, blistering, peeling, or loosening of the skin, including inside the mouth Side effects that usually do not require medical attention (report these to your care team if they continue or are bothersome): Change in taste Diarrhea General discomfort and fatigue Increase in blood pressure Muscle pain Nausea Stomach pain This list may not describe all possible side effects. Call your doctor for medical advice about side effects. You may report side effects to FDA at 1-800-FDA-1088. Where should I keep my medication? Keep out of the reach of children and pets. Store at room temperature between 20 and 25 degrees C (68 and 77 degrees F). Get rid of any unused medication after the expiration date. To get rid of medications that are no longer needed or have expired: Take the medication to a medication take-back program. Check with your pharmacy or law enforcement to find a location. If you cannot return the medication, check the label or package insert to see if the medication should be thrown out in the garbage or flushed down the toilet. If you are not sure, ask your care team. If it is safe to put it in  the trash, take the medication out of the container. Mix the medication with cat litter, dirt, coffee grounds, or other unwanted substance. Seal the mixture in a bag or container. Put it in the trash. NOTE: This sheet is a summary. It may not cover all possible information. If you have questions about this medicine, talk to your doctor, pharmacist, or health care provider.  2023 Elsevier/Gold Standard (2020-02-10 00:00:00)   If you have been instructed to have an in-person evaluation today at a local Urgent Care facility, please use the link below. It will take you to a list of all of our available [facility], including address, phone number and hours of operation. Please do not delay care.  [facility]  If you or a family member do not have a primary care provider, use the link below to schedule a visit and establish care. When you choose a [facility] primary care physician or advanced practice provider, you gain a long-term partner in health. Find a Primary Care Provider  Learn more  about [facility]'s in-office and virtual care options: [facility]

## 2022-01-26 ENCOUNTER — Ambulatory Visit: Payer: BC Managed Care – PPO | Admitting: Orthopedic Surgery

## 2022-06-05 NOTE — Progress Notes (Unsigned)
Office Visit Note   Patient: Anna Glover           Date of Birth: [birth year]           MRN: [id number] Visit Date: 06/07/2022              Requested by: Courtney Paris, NP [address] [place],  [place] [address] PCP: Courtney Paris, NP   Assessment & Plan: Visit Diagnoses:  1. Primary osteoarthritis of left knee     Plan: Impression is end-stage left knee DJD primarily on the medial compartment.  I do see a little bit of spurring of the patella.  I reviewed the intraoperative photos.  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  At this point based on her options she has elected to undergo a partial versus a total knee replacement based on intraoperative findings.  She does report a nickel allergy.  Denies history of DVT.  Previous smoker and drinks about 2-3 alcoholic beverages a night.  Risk benefits prognosis of the surgery reviewed with the patient.  Anna Glover will call the patient to confirm surgery time.  Follow-Up Instructions: No follow-ups on file.   Orders:  Orders Placed This Encounter  Procedures   XR KNEE 3 VIEW LEFT   No orders of the defined types were placed in this encounter.     Procedures: No procedures performed   Clinical Data: No additional findings.   Subjective: Chief Complaint  Patient presents with   Left Knee - Follow-up    HPI  Anna Glover is a 47 year old female who is well-known to me.  She comes in for continued severe left knee pain that is constant.  She is experiencing significant problems with ADLs and quality of life.  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  She made a good recovery from the meniscal tear but she has had ongoing issues due to the chondromalacia and DJD.  She has tried gel and steroid injections as well as medial unloader brace.  These are no longer  effective.  Review of Systems  Constitutional: Negative.   HENT: Negative.    Eyes: Negative.   Respiratory: Negative.    Cardiovascular: Negative.   Endocrine: Negative.   Musculoskeletal: Negative.   Neurological: Negative.   Hematological: Negative.   Psychiatric/Behavioral: Negative.    All other systems reviewed and are negative.    Objective: Vital Signs: There were no vitals taken for this visit.  Physical Exam Vitals and nursing note reviewed.  Constitutional:      Appearance: She is well-developed.  HENT:     Head: Normocephalic and atraumatic.  Pulmonary:     Effort: Pulmonary effort is normal.  Abdominal:     Palpations: Abdomen is soft.  Musculoskeletal:     Cervical back: Neck supple.  Skin:    General: Skin is warm.     Capillary Refill: Capillary refill takes less than 2 seconds.  Neurological:     Mental Status: She is alert and oriented to person, place, and time.  Psychiatric:        Behavior: Behavior normal.        Thought Content: Thought content normal.        Judgment: Judgment normal.    Ortho Exam  Examination left knee shows medial joint line tenderness.  No joint effusion.  Pain and crepitus throughout range of motion.  Collaterals and cruciates are stable.  Specialty Comments:  No specialty comments available.  Imaging: XR KNEE 3 VIEW LEFT  Result Date: 06/07/2022 Significant joint space narrowing of the medial compartment.  There is periarticular spurring of the patella.    PMFS History: Patient Active Problem List   Diagnosis Date Noted   Gastritis and gastroduodenitis    Acute esophagitis    RUQ pain    Alcohol dependence syndrome 01/03/2021   Essential hypertension 01/03/2021   Hypothyroidism (acquired) 01/03/2021   Depression with anxiety 01/03/2021   Hematemesis 01/02/2021   Acute medial meniscus tear of left knee 06/30/2020   Primary osteoarthritis of right knee 03/31/2020   Primary osteoarthritis of left knee  03/31/2020   Headache 06/03/2014   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   Thrombocytopenia 06/03/2014   Elevated LFTs 06/03/2014   Past Medical History:  Diagnosis Date   Anxiety    Depression    Hypertension    Hypothyroidism     Family History  Problem Relation Age of Onset   Hypertension Mother    Hyperlipidemia Mother    Hypertension Father    Heart disease Father    Pulmonary fibrosis Father    Migraines Neg Hx     Past Surgical History:  Procedure Laterality Date   BIOPSY  01/04/2021   Procedure: BIOPSY;  Surgeon: Tressia Danas, MD;  Location[facility] ENDOSCOPY;  Service: Gastroenterology;;   CESAREAN SECTION  02/25/2003   CESAREAN SECTION  05/01/2009   CHOLECYSTECTOMY  June 2014   ESOPHAGOGASTRODUODENOSCOPY (EGD) WITH PROPOFOL N/A 01/04/2021   Procedure: ESOPHAGOGASTRODUODENOSCOPY (EGD) WITH PROPOFOL;  Surgeon: Tressia Danas, MD;  Location: [facility] ENDOSCOPY;  Service: Gastroenterology;  Laterality: N/A;   Social History   Occupational History   Occupation: Midwife  Tobacco Use   Smoking status: Former    Years: 10    Types: Cigarettes    Quit date: 07/10/2009    Years since quitting: 12.9   Smokeless tobacco: Never  Vaping Use   Vaping Use: Never used  Substance and Sexual Activity   Alcohol use: Yes    Alcohol/week: 1.0 standard drink of alcohol    Types: 1 Glasses of wine per week    Comment: "too much" - a bottle of wine a night   Drug use: No   Sexual activity: Not on file

## 2022-06-07 ENCOUNTER — Other Ambulatory Visit (INDEPENDENT_AMBULATORY_CARE_PROVIDER_SITE_OTHER): Payer: BC Managed Care – PPO

## 2022-06-07 ENCOUNTER — Ambulatory Visit: Payer: BC Managed Care – PPO | Admitting: Orthopaedic Surgery

## 2022-06-07 DIAGNOSIS — M1712 Unilateral primary osteoarthritis, left knee: Secondary | ICD-10-CM | POA: Diagnosis not present

## 2022-06-16 ENCOUNTER — Other Ambulatory Visit: Payer: Self-pay

## 2022-07-08 ENCOUNTER — Ambulatory Visit: Payer: BC Managed Care – PPO

## 2022-07-08 DIAGNOSIS — M1712 Unilateral primary osteoarthritis, left knee: Secondary | ICD-10-CM

## 2022-07-08 NOTE — Progress Notes (Signed)
Surgical Instructions    Your procedure is scheduled on Monday July 18, 2022.  Report to [facility] Main Entrance "A" at 10:15 A.M., then check in with the Admitting office.  Call this number if you have problems the morning of surgery:  [phone]   If you have any questions prior to your surgery date call [phone]: Open Monday-Friday 8am-4pm If you experience any cold or flu symptoms such as cough, fever, chills, shortness of breath, etc. between now and your scheduled surgery, please notify us at the above number     Remember:  Do not eat after midnight the night before your surgery  You may drink clear liquids until 9:15 the morning of your surgery.   Clear liquids allowed are: Water, Non-Citrus Juices (without pulp), Carbonated Beverages, Clear Tea, Black Coffee ONLY (NO MILK, CREAM OR POWDERED CREAMER of any kind), and Gatorade.    Enhanced Recovery after Surgery for Orthopedics Enhanced Recovery after Surgery is a protocol used to improve the stress on your body and your recovery after surgery.  Patient Instructions  The day of surgery (if you do NOT have diabetes):  Drink ONE (1) Pre-Surgery Clear Ensure by 9:15 am the morning of surgery   This drink was given to you during your hospital  pre-op appointment visit. Nothing else to drink after completing the  Pre-Surgery Clear Ensure.         If you have questions, please contact your surgeon's office.     Take these medicines the morning of surgery with A SIP OF WATER:  celecoxib (CELEBREX)  levothyroxine (SYNTHROID  doxycycline (VIBRA-TABS)   If needed:  ondansetron (ZOFRAN)  pantoprazole (PROTONIX)   As of today, STOP taking any Aspirin (unless otherwise instructed by your surgeon) Aleve, Naproxen, Ibuprofen, Motrin, Advil, Goody's, BC's, all herbal medications, fish oil, and all vitamins.  Special instructions:    Oral Hygiene is also important to reduce your risk of infection.  Remember - BRUSH YOUR  TEETH THE MORNING OF SURGERY WITH YOUR REGULAR TOOTHPASTE    Pre-operative 5 CHG Bath Instructions   You can play a key role in reducing the risk of infection after surgery. Your skin needs to be as free of germs as possible. You can reduce the number of germs on your skin by washing with CHG (chlorhexidine gluconate) soap before surgery. CHG is an antiseptic soap that kills germs and continues to kill germs even after washing.   DO NOT use if you have an allergy to chlorhexidine/CHG or antibacterial soaps. If your skin becomes reddened or irritated, stop using the CHG and notify one of our RNs at [phone].   Please shower with the CHG soap starting 4 days before surgery using the following schedule:     Please keep in mind the following:  DO NOT shave, including legs and underarms, starting the day of your first shower.   You may shave your face at any point before/day of surgery.  Place clean sheets on your bed the day you start using CHG soap. Use a clean washcloth (not used since being washed) for each shower. DO NOT sleep with pets once you start using the CHG.   CHG Shower Instructions:  If you choose to wash your hair and private area, wash first with your normal shampoo/soap.  After you use shampoo/soap, rinse your hair and body thoroughly to remove shampoo/soap residue.  Turn the water OFF and apply about 3 tablespoons (45 ml) of CHG soap to a CLEAN washcloth.  Apply CHG soap ONLY FROM YOUR NECK DOWN TO YOUR TOES (washing for 3-5 minutes)  DO NOT use CHG soap on face, private areas, open wounds, or sores.  Pay special attention to the area where your surgery is being performed.  If you are having back surgery, having someone wash your back for you may be helpful. Wait 2 minutes after CHG soap is applied, then you may rinse off the CHG soap.  Pat dry with a clean towel  Put on clean clothes/pajamas   If you choose to wear lotion, please use ONLY the CHG-compatible lotions  on the back of this paper.     Additional instructions for the day of surgery: DO NOT APPLY any lotions, deodorants, cologne, or perfumes.   Put on clean/comfortable clothes.  Brush your teeth.  Ask your nurse before applying any prescription medications to the skin.      CHG Compatible Lotions   Aveeno Moisturizing lotion  Cetaphil Moisturizing Cream  Cetaphil Moisturizing Lotion  Clairol Herbal Essence Moisturizing Lotion, Dry Skin  Clairol Herbal Essence Moisturizing Lotion, Extra Dry Skin  Clairol Herbal Essence Moisturizing Lotion, Normal Skin  Curel Age Defying Therapeutic Moisturizing Lotion with Alpha Hydroxy  Curel Extreme Care Body Lotion  Curel Soothing Hands Moisturizing Hand Lotion  Curel Therapeutic Moisturizing Cream, Fragrance-Free  Curel Therapeutic Moisturizing Lotion, Fragrance-Free  Curel Therapeutic Moisturizing Lotion, Original Formula  Eucerin Daily Replenishing Lotion  Eucerin Dry Skin Therapy Plus Alpha Hydroxy Crme  Eucerin Dry Skin Therapy Plus Alpha Hydroxy Lotion  Eucerin Original Crme  Eucerin Original Lotion  Eucerin Plus Crme Eucerin Plus Lotion  Eucerin TriLipid Replenishing Lotion  Keri Anti-Bacterial Hand Lotion  Keri Deep Conditioning Original Lotion Dry Skin Formula Softly Scented  Keri Deep Conditioning Original Lotion, Fragrance Free Sensitive Skin Formula  Keri Lotion Fast Absorbing Fragrance Free Sensitive Skin Formula  Keri Lotion Fast Absorbing Softly Scented Dry Skin Formula  Keri Original Lotion  Keri Skin Renewal Lotion Keri Silky Smooth Lotion  Keri Silky Smooth Sensitive Skin Lotion  Nivea Body Creamy Conditioning Oil  Nivea Body Extra Enriched Teacher, adult education Moisturizing Lotion Nivea Crme  Nivea Skin Firming Lotion  NutraDerm 30 Skin Lotion  NutraDerm Skin Lotion  NutraDerm Therapeutic Skin Cream  NutraDerm Therapeutic Skin Lotion  ProShield Protective Hand Cream  Provon  moisturizing lotion  Do not wear jewelry or makeup. Do not wear lotions, powders, perfumes/cologne or deodorant. Do not shave 48 hours prior to surgery.  Men may shave face and neck. Do not bring valuables to the hospital. Do not wear nail polish, gel polish, artificial nails, or any other type of covering on natural nails (fingers and toes) If you have artificial nails or gel coating that need to be removed by a nail salon, please have this removed prior to surgery. Artificial nails or gel coating may interfere with anesthesia's ability to adequately monitor your vital signs.  [facility] is not responsible for any belongings or valuables.    Do NOT Smoke (Tobacco/Vaping)  24 hours prior to your procedure  If you use a CPAP at night, you may bring your mask for your overnight stay.   Contacts, glasses, hearing aids, dentures or partials may not be worn into surgery, please bring cases for these belongings   For patients admitted to the hospital, discharge time will be determined by your treatment team.   Patients discharged the day of surgery will not be allowed to drive home,  and someone needs to stay with them for 24 hours.   SURGICAL WAITING ROOM VISITATION Patients having surgery or a procedure may have no more than 2 support people in the waiting area - these visitors may rotate.   Children under the age of 30 must have an adult with them who is not the patient. If the patient needs to stay at the hospital during part of their recovery, the visitor guidelines for inpatient rooms apply. Pre-op nurse will coordinate an appropriate time for 1 support person to accompany patient in pre-op.  This support person may not rotate.   Please refer to [email or web address] for the visitor guidelines for Inpatients (after your surgery is over and you are in a regular room).   If you received a COVID test during your pre-op visit, it is requested  that you wear a mask when out in public, stay away from anyone that may not be feeling well, and notify your surgeon if you develop symptoms. If you have been in contact with anyone that has tested positive in the last 10 days, please notify your surgeon.    Please read over the following fact sheets that you were given.

## 2022-07-12 ENCOUNTER — Encounter (HOSPITAL_COMMUNITY): Payer: Self-pay

## 2022-07-12 ENCOUNTER — Other Ambulatory Visit (HOSPITAL_COMMUNITY): Payer: BC Managed Care – PPO

## 2022-07-12 ENCOUNTER — Other Ambulatory Visit: Payer: Self-pay

## 2022-07-12 ENCOUNTER — Encounter (HOSPITAL_COMMUNITY)
Admission: RE | Admit: 2022-07-12 | Discharge: 2022-07-12 | Disposition: A | Discharge status: Home / Self Care | Payer: BC Managed Care – PPO | Source: Ambulatory Visit | Attending: Orthopaedic Surgery | Admitting: Orthopaedic Surgery

## 2022-07-12 ENCOUNTER — Other Ambulatory Visit: Payer: Self-pay | Admitting: Physician Assistant

## 2022-07-12 VITALS — BP 123/86 | HR 65 | Temp 98.0°F | Resp 17 | Ht 72.0 in | Wt 233.0 lb

## 2022-07-12 DIAGNOSIS — Z01818 Encounter for other preprocedural examination: Secondary | ICD-10-CM | POA: Insufficient documentation

## 2022-07-12 DIAGNOSIS — M1712 Unilateral primary osteoarthritis, left knee: Secondary | ICD-10-CM | POA: Diagnosis not present

## 2022-07-12 LAB — BASIC METABOLIC PANEL
Anion gap: 8 (ref 5–15)
BUN: 12 mg/dL (ref 6–20)
CO2: 25 mmol/L (ref 22–32)
Calcium: 9.1 mg/dL (ref 8.9–10.3)
Chloride: 103 mmol/L (ref 98–111)
Creatinine, Ser: 1.02 mg/dL — ABNORMAL HIGH (ref 0.44–1.00)
GFR, Estimated: >60 mL/min (ref >60)
Glucose, Bld: 107 mg/dL — ABNORMAL HIGH (ref 70–99)
Potassium: 3.9 mmol/L (ref 3.5–5.1)
Sodium: 136 mmol/L (ref 135–145)

## 2022-07-12 LAB — CBC
HCT: 43.1 % (ref 36.0–46.0)
Hemoglobin: 14.4 g/dL (ref 12.0–15.0)
MCH: 32.1 pg (ref 26.0–34.0)
MCHC: 33.4 g/dL (ref 30.0–36.0)
MCV: 96 fL (ref 80.0–100.0)
Platelets: 221 10*3/uL (ref 150–400)
RBC: 4.49 MIL/uL (ref 3.87–5.11)
RDW: 12.1 % (ref 11.5–15.5)
WBC: 6.7 10*3/uL (ref 4.0–10.5)
nRBC: 0 % (ref 0.0–0.2)

## 2022-07-12 LAB — PREALBUMIN: Prealbumin: 28 mg/dL (ref 17–34)

## 2022-07-12 LAB — SURGICAL PCR SCREEN
MRSA, PCR: NEGATIVE
Staphylococcus aureus: NEGATIVE

## 2022-07-12 LAB — HEMOGLOBIN A1C
Hgb A1c MFr Bld: 5.5 % of total Hgb (ref <5.7)
Mean Plasma Glucose: 111 mg/dL
eAG (mmol/L): 6.2 mmol/L

## 2022-07-12 MED ORDER — DOCUSATE SODIUM 100 MG PO CAPS: 100.0000 mg | ORAL_CAPSULE | Freq: Every day | ORAL | 2 refills | Status: AC | PRN

## 2022-07-12 MED ORDER — ASPIRIN 81 MG PO TBEC: 81.0000 mg | DELAYED_RELEASE_TABLET | Freq: Two times a day (BID) | ORAL | 0 refills | Status: AC

## 2022-07-12 MED ORDER — ONDANSETRON HCL 4 MG PO TABS: 4.0000 mg | ORAL_TABLET | Freq: Three times a day (TID) | ORAL | 0 refills | Status: AC | PRN

## 2022-07-12 MED ORDER — METHOCARBAMOL 750 MG PO TABS: 750.0000 mg | ORAL_TABLET | Freq: Three times a day (TID) | ORAL | 2 refills | Status: AC | PRN

## 2022-07-12 MED ORDER — OXYCODONE-ACETAMINOPHEN 5-325 MG PO TABS: 1.0000 | ORAL_TABLET | Freq: Four times a day (QID) | ORAL | 0 refills | Status: AC | PRN

## 2022-07-12 NOTE — Progress Notes (Addendum)
PCP - Otilio Miu NP Cardiologist - Denies  PPM/ICD - Denies  Chest x-ray - N/I EKG - N/I Stress Test - Denies ECHO - Denies Cardiac Cath - Denies  Sleep Study - Denies  DM - Denies  Blood Thinner Instructions:N/A Aspirin Instructions:N/A  ERAS Protcol -Yes PRE-SURGERY Ensure given   COVID TEST- N/A   Anesthesia review: Yes abnormal EKG   Patient denies shortness of breath, fever, cough and chest pain at PAT appointment   All instructions explained to the patient, with a verbal understanding of the material. Patient agrees to go over the instructions while at home for a better understanding. The opportunity to ask questions was provided.

## 2022-07-12 NOTE — Progress Notes (Signed)
Surgical Instructions                 Your procedure is scheduled on Monday July 18, 2022.             Report to [facility] Main Entrance "A" at 10:15 A.M., then check in with the Admitting office.             Call this number if you have problems the morning of surgery:             [phone]    If you have any questions prior to your surgery date call [phone]: Open Monday-Friday 8am-4pm If you experience any cold or flu symptoms such as cough, fever, chills, shortness of breath, etc. between now and your scheduled surgery, please notify us at the above number                  Remember:             Do not eat after midnight the night before your surgery   You may drink clear liquids until 9:15 the morning of your surgery.   Clear liquids allowed are: Water, Non-Citrus Juices (without pulp), Carbonated Beverages, Clear Tea, Black Coffee ONLY (NO MILK, CREAM OR POWDERED CREAMER of any kind), and Gatorade.      Enhanced Recovery after Surgery for Orthopedics Enhanced Recovery after Surgery is a protocol used to improve the stress on your body and your recovery after surgery.   Patient Instructions  The day of surgery (if you do NOT have diabetes):  Drink ONE (1) Pre-Surgery Clear Ensure by 9:15 am the morning of surgery   This drink was given to you during your hospital  pre-op appointment visit. Nothing else to drink after completing the  Pre-Surgery Clear Ensure.          If you have questions, please contact your surgeon's office.                            Take these medicines the morning of surgery with A SIP OF WATER:  levothyroxine (SYNTHROID  doxycycline (VIBRA-TABS)    If needed:  ondansetron (ZOFRAN)  pantoprazole (PROTONIX)    As of today, STOP taking any Aspirin (unless otherwise instructed by your surgeon) celecoxib (CELEBREX), Aleve, Naproxen, Ibuprofen, Motrin, Advil, Goody's, BC's, all herbal medications, fish oil, and all vitamins.   Special  instructions:     Oral Hygiene is also important to reduce your risk of infection.  Remember - BRUSH YOUR TEETH THE MORNING OF SURGERY WITH YOUR REGULAR TOOTHPASTE      Pre-operative 5 CHG Bath Instructions    You can play a key role in reducing the risk of infection after surgery. Your skin needs to be as free of germs as possible. You can reduce the number of germs on your skin by washing with CHG (chlorhexidine gluconate) soap before surgery. CHG is an antiseptic soap that kills germs and continues to kill germs even after washing.    DO NOT use if you have an allergy to chlorhexidine/CHG or antibacterial soaps. If your skin becomes reddened or irritated, stop using the CHG and notify one of our RNs at [phone].    Please shower with the CHG soap starting 4 days before surgery using the following schedule:       Please keep in mind the following:  DO NOT shave, including legs and underarms, starting the day of  your first shower.   You may shave your face at any point before/day of surgery.  Place clean sheets on your bed the day you start using CHG soap. Use a clean washcloth (not used since being washed) for each shower. DO NOT sleep with pets once you start using the CHG.    CHG Shower Instructions:  If you choose to wash your hair and private area, wash first with your normal shampoo/soap.  After you use shampoo/soap, rinse your hair and body thoroughly to remove shampoo/soap residue.  Turn the water OFF and apply about 3 tablespoons (45 ml) of CHG soap to a CLEAN washcloth.  Apply CHG soap ONLY FROM YOUR NECK DOWN TO YOUR TOES (washing for 3-5 minutes)  DO NOT use CHG soap on face, private areas, open wounds, or sores.  Pay special attention to the area where your surgery is being performed.  If you are having back surgery, having someone wash your back for you may be helpful. Wait 2 minutes after CHG soap is applied, then you may rinse off the CHG soap.  Pat dry with a  clean towel  Put on clean clothes/pajamas   If you choose to wear lotion, please use ONLY the CHG-compatible lotions on the back of this paper.     Additional instructions for the day of surgery: DO NOT APPLY any lotions, deodorants, cologne, or perfumes.   Put on clean/comfortable clothes.  Brush your teeth.  Ask your nurse before applying any prescription medications to the skin.          CHG Compatible Lotions    Aveeno Moisturizing lotion  Cetaphil Moisturizing Cream  Cetaphil Moisturizing Lotion  Clairol Herbal Essence Moisturizing Lotion, Dry Skin  Clairol Herbal Essence Moisturizing Lotion, Extra Dry Skin  Clairol Herbal Essence Moisturizing Lotion, Normal Skin  Curel Age Defying Therapeutic Moisturizing Lotion with Alpha Hydroxy  Curel Extreme Care Body Lotion  Curel Soothing Hands Moisturizing Hand Lotion  Curel Therapeutic Moisturizing Cream, Fragrance-Free  Curel Therapeutic Moisturizing Lotion, Fragrance-Free  Curel Therapeutic Moisturizing Lotion, Original Formula  Eucerin Daily Replenishing Lotion  Eucerin Dry Skin Therapy Plus Alpha Hydroxy Crme  Eucerin Dry Skin Therapy Plus Alpha Hydroxy Lotion  Eucerin Original Crme  Eucerin Original Lotion  Eucerin Plus Crme Eucerin Plus Lotion  Eucerin TriLipid Replenishing Lotion  Keri Anti-Bacterial Hand Lotion  Keri Deep Conditioning Original Lotion Dry Skin Formula Softly Scented  Keri Deep Conditioning Original Lotion, Fragrance Free Sensitive Skin Formula  Keri Lotion Fast Absorbing Fragrance Free Sensitive Skin Formula  Keri Lotion Fast Absorbing Softly Scented Dry Skin Formula  Keri Original Lotion  Keri Skin Renewal Lotion Keri Silky Smooth Lotion  Keri Silky Smooth Sensitive Skin Lotion  Nivea Body Creamy Conditioning Oil  Nivea Body Extra Enriched Teacher, adult education Moisturizing Lotion Nivea Crme  Nivea Skin Firming Lotion  NutraDerm 30 Skin Lotion  NutraDerm Skin  Lotion  NutraDerm Therapeutic Skin Cream  NutraDerm Therapeutic Skin Lotion  ProShield Protective Hand Cream  Provon moisturizing lotion   Do not wear jewelry or makeup. Do not wear lotions, powders, perfumes or deodorant. Do not shave 48 hours prior to surgery.   Do not bring valuables to the hospital. Do not wear nail polish, gel polish, artificial nails, or any other type of covering on natural nails (fingers and toes) If you have artificial nails or gel coating that need to be removed by a nail salon, please have this removed prior to  surgery. Artificial nails or gel coating may interfere with anesthesia's ability to adequately monitor your vital signs.   [facility] is not responsible for any belongings or valuables.     Do NOT Smoke (Tobacco/Vaping)  24 hours prior to your procedure   If you use a CPAP at night, you may bring your mask for your overnight stay.   Contacts, glasses, hearing aids, dentures or partials may not be worn into surgery, please bring cases for these belongings   For patients admitted to the hospital, discharge time will be determined by your treatment team.   Patients discharged the day of surgery will not be allowed to drive home, and someone needs to stay with them for 24 hours.     SURGICAL WAITING ROOM VISITATION Patients having surgery or a procedure may have no more than 2 support people in the waiting area - these visitors may rotate.   Children under the age of 1 must have an adult with them who is not the patient. If the patient needs to stay at the hospital during part of their recovery, the visitor guidelines for inpatient rooms apply. Pre-op nurse will coordinate an appropriate time for 1 support person to accompany patient in pre-op.  This support person may not rotate.    Please refer to [email or web address] for the visitor guidelines for Inpatients (after your surgery is over and you are in  a regular room).    If you received a COVID test during your pre-op visit, it is requested that you wear a mask when out in public, stay away from anyone that may not be feeling well, and notify your surgeon if you develop symptoms. If you have been in contact with anyone that has tested positive in the last 10 days, please notify your surgeon.     Please read over the following fact sheets that you were given.

## 2022-07-15 MED ORDER — TRANEXAMIC ACID 1000 MG/10ML IV SOLN
2000.0000 mg | INTRAVENOUS | Status: DC
  Filled 2022-07-15 (×2): qty 20

## 2022-07-18 ENCOUNTER — Ambulatory Visit (HOSPITAL_COMMUNITY): Payer: BC Managed Care – PPO | Admitting: Physician Assistant

## 2022-07-18 ENCOUNTER — Encounter (HOSPITAL_COMMUNITY): Payer: Self-pay | Admitting: Orthopaedic Surgery

## 2022-07-18 ENCOUNTER — Other Ambulatory Visit: Payer: Self-pay

## 2022-07-18 ENCOUNTER — Observation Stay (HOSPITAL_COMMUNITY): Payer: BC Managed Care – PPO

## 2022-07-18 ENCOUNTER — Encounter (HOSPITAL_COMMUNITY)
Admission: RE | Disposition: A | Discharge status: Home Health | Payer: Self-pay | Source: Home / Self Care | Attending: Orthopaedic Surgery

## 2022-07-18 ENCOUNTER — Inpatient Hospital Stay (HOSPITAL_COMMUNITY)
Admission: RE | Admit: 2022-07-18 | Discharge: 2022-07-21 | DRG: 470 | Disposition: A | Discharge status: Home Health | Payer: BC Managed Care – PPO | Attending: Internal Medicine | Admitting: Internal Medicine

## 2022-07-18 ENCOUNTER — Ambulatory Visit (HOSPITAL_COMMUNITY): Payer: BC Managed Care – PPO | Admitting: Certified Registered"

## 2022-07-18 DIAGNOSIS — Z83438 Family history of other disorder of lipoprotein metabolism and other lipidemia: Secondary | ICD-10-CM

## 2022-07-18 DIAGNOSIS — Z96652 Presence of left artificial knee joint: Secondary | ICD-10-CM

## 2022-07-18 DIAGNOSIS — K760 Fatty (change of) liver, not elsewhere classified: Secondary | ICD-10-CM | POA: Diagnosis present

## 2022-07-18 DIAGNOSIS — I10 Essential (primary) hypertension: Secondary | ICD-10-CM | POA: Diagnosis present

## 2022-07-18 DIAGNOSIS — D72829 Elevated white blood cell count, unspecified: Secondary | ICD-10-CM | POA: Diagnosis not present

## 2022-07-18 DIAGNOSIS — R0789 Other chest pain: Secondary | ICD-10-CM | POA: Diagnosis not present

## 2022-07-18 DIAGNOSIS — Z880 Allergy status to penicillin: Secondary | ICD-10-CM

## 2022-07-18 DIAGNOSIS — Z79899 Other long term (current) drug therapy: Secondary | ICD-10-CM

## 2022-07-18 DIAGNOSIS — E039 Hypothyroidism, unspecified: Secondary | ICD-10-CM | POA: Diagnosis present

## 2022-07-18 DIAGNOSIS — M1712 Unilateral primary osteoarthritis, left knee: Principal | ICD-10-CM

## 2022-07-18 DIAGNOSIS — Z9049 Acquired absence of other specified parts of digestive tract: Secondary | ICD-10-CM

## 2022-07-18 DIAGNOSIS — Z87891 Personal history of nicotine dependence: Secondary | ICD-10-CM

## 2022-07-18 DIAGNOSIS — F419 Anxiety disorder, unspecified: Secondary | ICD-10-CM | POA: Diagnosis present

## 2022-07-18 DIAGNOSIS — Z8249 Family history of ischemic heart disease and other diseases of the circulatory system: Secondary | ICD-10-CM

## 2022-07-18 DIAGNOSIS — Z881 Allergy status to other antibiotic agents status: Secondary | ICD-10-CM

## 2022-07-18 DIAGNOSIS — R079 Chest pain, unspecified: Secondary | ICD-10-CM | POA: Insufficient documentation

## 2022-07-18 DIAGNOSIS — F1020 Alcohol dependence, uncomplicated: Secondary | ICD-10-CM | POA: Diagnosis present

## 2022-07-18 DIAGNOSIS — Z882 Allergy status to sulfonamides status: Secondary | ICD-10-CM

## 2022-07-18 DIAGNOSIS — M94262 Chondromalacia, left knee: Secondary | ICD-10-CM | POA: Diagnosis present

## 2022-07-18 DIAGNOSIS — F418 Other specified anxiety disorders: Secondary | ICD-10-CM | POA: Diagnosis present

## 2022-07-18 DIAGNOSIS — K2950 Unspecified chronic gastritis without bleeding: Secondary | ICD-10-CM | POA: Diagnosis present

## 2022-07-18 DIAGNOSIS — M2242 Chondromalacia patellae, left knee: Secondary | ICD-10-CM | POA: Diagnosis present

## 2022-07-18 DIAGNOSIS — Z79890 Hormone replacement therapy (postmenopausal): Secondary | ICD-10-CM

## 2022-07-18 DIAGNOSIS — K219 Gastro-esophageal reflux disease without esophagitis: Secondary | ICD-10-CM | POA: Diagnosis present

## 2022-07-18 DIAGNOSIS — F32A Depression, unspecified: Secondary | ICD-10-CM | POA: Diagnosis present

## 2022-07-18 DIAGNOSIS — R55 Syncope and collapse: Secondary | ICD-10-CM | POA: Diagnosis not present

## 2022-07-18 DIAGNOSIS — Z7982 Long term (current) use of aspirin: Secondary | ICD-10-CM

## 2022-07-18 DIAGNOSIS — R519 Headache, unspecified: Secondary | ICD-10-CM | POA: Diagnosis not present

## 2022-07-18 HISTORY — PX: TOTAL KNEE ARTHROPLASTY: SHX125

## 2022-07-18 SURGERY — ARTHROPLASTY, KNEE, TOTAL
Anesthesia: Regional | Site: Knee | Laterality: Left

## 2022-07-18 MED ORDER — ACETAMINOPHEN 500 MG PO TABS
1000.0000 mg | ORAL_TABLET | Freq: Once | ORAL | Status: AC
  Administered 2022-07-18: 1000 mg via ORAL
  Filled 2022-07-18: qty 2

## 2022-07-18 MED ORDER — OXYCODONE HCL 5 MG PO TABS
5.0000 mg | ORAL_TABLET | ORAL | Status: DC | PRN
  Administered 2022-07-18 – 2022-07-19 (×2): 5 mg via ORAL
  Filled 2022-07-18: qty 2
  Filled 2022-07-18 (×2): qty 1

## 2022-07-18 MED ORDER — MIDAZOLAM HCL 2 MG/2ML IJ SOLN: 2.0000 mg | Freq: Once | INTRAMUSCULAR | Status: AC

## 2022-07-18 MED ORDER — CEFAZOLIN SODIUM-DEXTROSE 2-4 GM/100ML-% IV SOLN: 2.0000 g | Freq: Once | INTRAVENOUS | Status: DC

## 2022-07-18 MED ORDER — ACETAMINOPHEN 325 MG PO TABS
325.0000 mg | ORAL_TABLET | Freq: Four times a day (QID) | ORAL | Status: DC | PRN
  Administered 2022-07-19 – 2022-07-20 (×2): 650 mg via ORAL
  Filled 2022-07-18 (×3): qty 2

## 2022-07-18 MED ORDER — LEVOTHYROXINE SODIUM 75 MCG PO TABS
175.0000 ug | ORAL_TABLET | Freq: Every day | ORAL | Status: DC
  Administered 2022-07-19 – 2022-07-21 (×3): 175 ug via ORAL
  Filled 2022-07-18 (×3): qty 1

## 2022-07-18 MED ORDER — METHOCARBAMOL 1000 MG/10ML IJ SOLN
500.0000 mg | Freq: Four times a day (QID) | INTRAVENOUS | Status: DC | PRN
  Filled 2022-07-18: qty 5

## 2022-07-18 MED ORDER — FERROUS SULFATE 325 (65 FE) MG PO TABS
325.0000 mg | ORAL_TABLET | Freq: Three times a day (TID) | ORAL | Status: DC
  Administered 2022-07-18 – 2022-07-21 (×9): 325 mg via ORAL
  Filled 2022-07-18 (×9): qty 1

## 2022-07-18 MED ORDER — ACETAMINOPHEN 500 MG PO TABS
1000.0000 mg | ORAL_TABLET | Freq: Four times a day (QID) | ORAL | Status: AC
  Administered 2022-07-18 – 2022-07-19 (×4): 1000 mg via ORAL
  Filled 2022-07-18 (×4): qty 2

## 2022-07-18 MED ORDER — 0.9 % SODIUM CHLORIDE (POUR BTL) OPTIME
TOPICAL | Status: DC | PRN
  Administered 2022-07-18: 1000 mL

## 2022-07-18 MED ORDER — ROPIVACAINE HCL 5 MG/ML IJ SOLN
INTRAMUSCULAR | Status: DC | PRN
  Administered 2022-07-18: 20 mL via PERINEURAL

## 2022-07-18 MED ORDER — FENTANYL CITRATE (PF) 100 MCG/2ML IJ SOLN
INTRAMUSCULAR | Status: AC
  Filled 2022-07-18: qty 2

## 2022-07-18 MED ORDER — CHLORHEXIDINE GLUCONATE 0.12 % MT SOLN
15.0000 mL | Freq: Once | OROMUCOSAL | Status: AC
  Administered 2022-07-18: 15 mL via OROMUCOSAL
  Filled 2022-07-18: qty 15

## 2022-07-18 MED ORDER — TRANEXAMIC ACID-NACL 1000-0.7 MG/100ML-% IV SOLN
1000.0000 mg | Freq: Once | INTRAVENOUS | Status: AC
  Administered 2022-07-18: 1000 mg via INTRAVENOUS
  Filled 2022-07-18: qty 100

## 2022-07-18 MED ORDER — AMLODIPINE BESYLATE 10 MG PO TABS
10.0000 mg | ORAL_TABLET | Freq: Every day | ORAL | Status: DC
  Administered 2022-07-18 – 2022-07-20 (×3): 10 mg via ORAL
  Filled 2022-07-18 (×3): qty 1

## 2022-07-18 MED ORDER — FENTANYL CITRATE (PF) 100 MCG/2ML IJ SOLN
INTRAMUSCULAR | Status: AC
  Administered 2022-07-18: 100 ug via INTRAVENOUS
  Filled 2022-07-18: qty 2

## 2022-07-18 MED ORDER — METHOCARBAMOL 500 MG PO TABS
500.0000 mg | ORAL_TABLET | Freq: Four times a day (QID) | ORAL | Status: DC | PRN
  Administered 2022-07-19 – 2022-07-21 (×4): 500 mg via ORAL
  Filled 2022-07-18 (×4): qty 1

## 2022-07-18 MED ORDER — CEFAZOLIN SODIUM-DEXTROSE 2-4 GM/100ML-% IV SOLN
2.0000 g | Freq: Four times a day (QID) | INTRAVENOUS | Status: AC
  Administered 2022-07-18 – 2022-07-19 (×2): 2 g via INTRAVENOUS
  Filled 2022-07-18 (×2): qty 100

## 2022-07-18 MED ORDER — SERTRALINE HCL 50 MG PO TABS
50.0000 mg | ORAL_TABLET | Freq: Every day | ORAL | Status: DC
  Administered 2022-07-18 – 2022-07-20 (×3): 50 mg via ORAL
  Filled 2022-07-18 (×3): qty 1

## 2022-07-18 MED ORDER — FENTANYL CITRATE (PF) 100 MCG/2ML IJ SOLN
25.0000 ug | INTRAMUSCULAR | Status: DC | PRN
  Administered 2022-07-18 (×2): 50 ug via INTRAVENOUS

## 2022-07-18 MED ORDER — ONDANSETRON HCL 4 MG/2ML IJ SOLN: 4.0000 mg | Freq: Four times a day (QID) | INTRAMUSCULAR | Status: DC | PRN

## 2022-07-18 MED ORDER — PHENOL 1.4 % MT LIQD: 1.0000 | OROMUCOSAL | Status: DC | PRN

## 2022-07-18 MED ORDER — METOCLOPRAMIDE HCL 5 MG PO TABS: 5.0000 mg | ORAL_TABLET | Freq: Three times a day (TID) | ORAL | Status: DC | PRN

## 2022-07-18 MED ORDER — OXYCODONE HCL ER 10 MG PO T12A
10.0000 mg | EXTENDED_RELEASE_TABLET | Freq: Two times a day (BID) | ORAL | Status: DC
  Administered 2022-07-18 – 2022-07-21 (×6): 10 mg via ORAL
  Filled 2022-07-18 (×6): qty 1

## 2022-07-18 MED ORDER — SODIUM CHLORIDE 0.9 % IR SOLN
Status: DC | PRN
  Administered 2022-07-18: 1000 mL

## 2022-07-18 MED ORDER — MENTHOL 3 MG MT LOZG: 1.0000 | LOZENGE | OROMUCOSAL | Status: DC | PRN

## 2022-07-18 MED ORDER — KETOROLAC TROMETHAMINE 15 MG/ML IJ SOLN
15.0000 mg | Freq: Four times a day (QID) | INTRAMUSCULAR | Status: AC
  Administered 2022-07-18 – 2022-07-19 (×4): 15 mg via INTRAVENOUS
  Filled 2022-07-18 (×4): qty 1

## 2022-07-18 MED ORDER — ONDANSETRON HCL 4 MG PO TABS
4.0000 mg | ORAL_TABLET | Freq: Four times a day (QID) | ORAL | Status: DC | PRN
  Administered 2022-07-18: 4 mg via ORAL
  Filled 2022-07-18: qty 1

## 2022-07-18 MED ORDER — LACTATED RINGERS IV SOLN: INTRAVENOUS | Status: DC

## 2022-07-18 MED ORDER — CEFAZOLIN SODIUM-DEXTROSE 2-4 GM/100ML-% IV SOLN
2.0000 g | Freq: Once | INTRAVENOUS | Status: AC
  Administered 2022-07-18: 2 g via INTRAVENOUS
  Filled 2022-07-18: qty 100

## 2022-07-18 MED ORDER — HYDROMORPHONE HCL 1 MG/ML IJ SOLN
0.5000 mg | INTRAMUSCULAR | Status: DC | PRN
  Administered 2022-07-18 – 2022-07-21 (×2): 1 mg via INTRAVENOUS
  Filled 2022-07-18 (×2): qty 1

## 2022-07-18 MED ORDER — ASPIRIN 81 MG PO CHEW
81.0000 mg | CHEWABLE_TABLET | Freq: Two times a day (BID) | ORAL | Status: DC
  Administered 2022-07-18 – 2022-07-21 (×6): 81 mg via ORAL
  Filled 2022-07-18 (×6): qty 1

## 2022-07-18 MED ORDER — METOCLOPRAMIDE HCL 5 MG/ML IJ SOLN: 5.0000 mg | Freq: Three times a day (TID) | INTRAMUSCULAR | Status: DC | PRN

## 2022-07-18 MED ORDER — FENTANYL CITRATE (PF) 100 MCG/2ML IJ SOLN: 100.0000 ug | Freq: Once | INTRAMUSCULAR | Status: AC

## 2022-07-18 MED ORDER — PHENYLEPHRINE HCL-NACL 20-0.9 MG/250ML-% IV SOLN
INTRAVENOUS | Status: DC | PRN
  Administered 2022-07-18: 30 ug/min via INTRAVENOUS

## 2022-07-18 MED ORDER — TRANEXAMIC ACID-NACL 1000-0.7 MG/100ML-% IV SOLN
1000.0000 mg | INTRAVENOUS | Status: AC
  Administered 2022-07-18: 1000 mg via INTRAVENOUS
  Filled 2022-07-18: qty 100

## 2022-07-18 MED ORDER — PROPOFOL 500 MG/50ML IV EMUL
INTRAVENOUS | Status: DC | PRN
  Administered 2022-07-18: 150 ug/kg/min via INTRAVENOUS

## 2022-07-18 MED ORDER — VANCOMYCIN HCL 1000 MG IV SOLR
INTRAVENOUS | Status: DC | PRN
  Administered 2022-07-18: 1000 mg via TOPICAL

## 2022-07-18 MED ORDER — BUPIVACAINE-MELOXICAM ER 400-12 MG/14ML IJ SOLN
INTRAMUSCULAR | Status: AC
  Filled 2022-07-18: qty 1

## 2022-07-18 MED ORDER — BUPIVACAINE IN DEXTROSE 0.75-8.25 % IT SOLN
INTRATHECAL | Status: DC | PRN
  Administered 2022-07-18: 1.6 mL via INTRATHECAL

## 2022-07-18 MED ORDER — SODIUM CHLORIDE 0.9 % IV SOLN: INTRAVENOUS | Status: DC

## 2022-07-18 MED ORDER — DOCUSATE SODIUM 100 MG PO CAPS
100.0000 mg | ORAL_CAPSULE | Freq: Two times a day (BID) | ORAL | Status: DC
  Administered 2022-07-18 – 2022-07-21 (×6): 100 mg via ORAL
  Filled 2022-07-18 (×6): qty 1

## 2022-07-18 MED ORDER — ONDANSETRON HCL 4 MG/2ML IJ SOLN
INTRAMUSCULAR | Status: DC | PRN
  Administered 2022-07-18: 4 mg via INTRAVENOUS

## 2022-07-18 MED ORDER — PROPOFOL 10 MG/ML IV BOLUS
INTRAVENOUS | Status: DC | PRN
  Administered 2022-07-18: 30 mg via INTRAVENOUS
  Administered 2022-07-18: 20 mg via INTRAVENOUS

## 2022-07-18 MED ORDER — POVIDONE-IODINE 10 % EX SWAB
2.0000 | Freq: Once | CUTANEOUS | Status: AC
  Administered 2022-07-18: 2 via TOPICAL

## 2022-07-18 MED ORDER — OXYCODONE HCL 5 MG PO TABS
10.0000 mg | ORAL_TABLET | ORAL | Status: DC | PRN
  Administered 2022-07-19: 10 mg via ORAL
  Administered 2022-07-20 – 2022-07-21 (×3): 15 mg via ORAL
  Filled 2022-07-18 (×3): qty 3

## 2022-07-18 MED ORDER — PROPOFOL 10 MG/ML IV BOLUS
INTRAVENOUS | Status: AC
  Filled 2022-07-18: qty 20

## 2022-07-18 MED ORDER — DEXAMETHASONE SODIUM PHOSPHATE 10 MG/ML IJ SOLN
10.0000 mg | Freq: Once | INTRAMUSCULAR | Status: AC
  Administered 2022-07-19: 10 mg via INTRAVENOUS
  Filled 2022-07-18: qty 1

## 2022-07-18 MED ORDER — MIDAZOLAM HCL 2 MG/2ML IJ SOLN
INTRAMUSCULAR | Status: AC
  Administered 2022-07-18: 2 mg via INTRAVENOUS
  Filled 2022-07-18: qty 2

## 2022-07-18 MED ORDER — BUPIVACAINE-MELOXICAM ER 400-12 MG/14ML IJ SOLN
INTRAMUSCULAR | Status: DC | PRN
  Administered 2022-07-18: 400 mg

## 2022-07-18 MED ORDER — TRANEXAMIC ACID 1000 MG/10ML IV SOLN
INTRAVENOUS | Status: DC | PRN
  Administered 2022-07-18: 2000 mg via TOPICAL

## 2022-07-18 MED ORDER — DEXAMETHASONE SODIUM PHOSPHATE 10 MG/ML IJ SOLN
INTRAMUSCULAR | Status: DC | PRN
  Administered 2022-07-18: 10 mg

## 2022-07-18 MED ORDER — ORAL CARE MOUTH RINSE: 15.0000 mL | Freq: Once | OROMUCOSAL | Status: AC

## 2022-07-18 MED ORDER — VANCOMYCIN HCL 1000 MG IV SOLR
INTRAVENOUS | Status: AC
  Filled 2022-07-18: qty 20

## 2022-07-18 MED ORDER — PRONTOSAN WOUND IRRIGATION OPTIME
TOPICAL | Status: DC | PRN
  Administered 2022-07-18: 1

## 2022-07-18 SURGICAL SUPPLY — 88 items
ADH SKN CLS APL DERMABOND .7 (GAUZE/BANDAGES/DRESSINGS)
ALCOHOL 70% 16 OZ (MISCELLANEOUS) ×1 IMPLANT
BAG COUNTER SPONGE SURGICOUNT (BAG) IMPLANT
BAG DECANTER FOR FLEXI CONT (MISCELLANEOUS) ×1 IMPLANT
BAG SPNG CNTER NS LX DISP (BAG) ×1
BANDAGE ESMARK 6X9 LF (GAUZE/BANDAGES/DRESSINGS) IMPLANT
BLADE SAG 18X100X1.27 (BLADE) ×1 IMPLANT
BLADE SAW RECIP 87.9 MT (BLADE) IMPLANT
BLADE SAW SGTL 73X25 THK (BLADE) ×1 IMPLANT
BNDG CMPR 9X6 STRL LF SNTH (GAUZE/BANDAGES/DRESSINGS)
BNDG ESMARK 6X9 LF (GAUZE/BANDAGES/DRESSINGS)
BOWL SMART MIX CTS (DISPOSABLE) ×1 IMPLANT
CEMENT BONE REFOBACIN R1X40 US (Cement) IMPLANT
CLSR STERI-STRIP ANTIMIC 1/2X4 (GAUZE/BANDAGES/DRESSINGS) ×2 IMPLANT
COMP FEM CMT CR STD 9 LT (Joint) ×1 IMPLANT
COMP PATELLAR 10X35 METAL (Joint) ×1 IMPLANT
COMPONENT FEM CMT CR STD 9 LT (Joint) IMPLANT
COMPONENT PATELLAR 10X35 METAL (Joint) IMPLANT
COOLER ICEMAN CLASSIC (MISCELLANEOUS) ×1 IMPLANT
COVER SURGICAL LIGHT HANDLE (MISCELLANEOUS) ×1 IMPLANT
CUFF TOURN SGL QUICK 34 (TOURNIQUET CUFF) ×1
CUFF TOURN SGL QUICK 42 (TOURNIQUET CUFF) IMPLANT
CUFF TRNQT CYL 34X4.125X (TOURNIQUET CUFF) ×1 IMPLANT
DERMABOND ADVANCED .7 DNX12 (GAUZE/BANDAGES/DRESSINGS) ×1 IMPLANT
DRAPE EXTREMITY T 121X128X90 (DISPOSABLE) ×1 IMPLANT
DRAPE HALF SHEET 40X57 (DRAPES) ×1 IMPLANT
DRAPE INCISE IOBAN 66X45 STRL (DRAPES) ×1 IMPLANT
DRAPE ORTHO SPLIT 77X108 STRL (DRAPES) ×1
DRAPE POUCH INSTRU U-SHP 10X18 (DRAPES) ×1 IMPLANT
DRAPE SURG ORHT 6 SPLT 77X108 (DRAPES) IMPLANT
DRAPE U-SHAPE 47X51 STRL (DRAPES) ×2 IMPLANT
DRSG AQUACEL AG ADV 3.5X10 (GAUZE/BANDAGES/DRESSINGS) ×1 IMPLANT
DURAPREP 26ML APPLICATOR (WOUND CARE) ×3 IMPLANT
ELECT CAUTERY BLADE 6.4 (BLADE) ×1 IMPLANT
ELECT PENCIL ROCKER SW 15FT (MISCELLANEOUS) ×1 IMPLANT
ELECT REM PT RETURN 9FT ADLT (ELECTROSURGICAL) ×1
ELECTRODE REM PT RTRN 9FT ADLT (ELECTROSURGICAL) ×1 IMPLANT
GLOVE BIOGEL PI IND STRL 7.0 (GLOVE) ×2 IMPLANT
GLOVE BIOGEL PI IND STRL 7.5 (GLOVE) ×5 IMPLANT
GLOVE ECLIPSE 7.0 STRL STRAW (GLOVE) ×3 IMPLANT
GLOVE INDICATOR 7.0 STRL GRN (GLOVE) ×1 IMPLANT
GLOVE INDICATOR 7.5 STRL GRN (GLOVE) ×1 IMPLANT
GLOVE SURG SYN 7.5 E (GLOVE) ×2 IMPLANT
GLOVE SURG SYN 7.5 PF PI (GLOVE) ×2 IMPLANT
GLOVE SURG UNDER LTX SZ7.5 (GLOVE) ×2 IMPLANT
GLOVE SURG UNDER POLY LF SZ7 (GLOVE) ×2 IMPLANT
GOWN STRL REUS W/ TWL LRG LVL3 (GOWN DISPOSABLE) ×1 IMPLANT
GOWN STRL REUS W/TWL LRG LVL3 (GOWN DISPOSABLE) ×1
GOWN STRL SURGICAL XL XLNG (GOWN DISPOSABLE) ×1 IMPLANT
GOWN TOGA ZIPPER T7+ PEEL AWAY (MISCELLANEOUS) ×2 IMPLANT
HANDPIECE INTERPULSE COAX TIP (DISPOSABLE) ×1
HOOD PEEL AWAY T7 (MISCELLANEOUS) ×1 IMPLANT
INSERT MED AS PERS SZ 8-11 LT (Insert) IMPLANT
KIT BASIN OR (CUSTOM PROCEDURE TRAY) ×1 IMPLANT
KIT TURNOVER KIT B (KITS) ×1 IMPLANT
MANIFOLD NEPTUNE II (INSTRUMENTS) ×1 IMPLANT
MARKER SKIN DUAL TIP RULER LAB (MISCELLANEOUS) ×2 IMPLANT
NDL SPNL 18GX3.5 QUINCKE PK (NEEDLE) ×1 IMPLANT
NEEDLE SPNL 18GX3.5 QUINCKE PK (NEEDLE) ×1 IMPLANT
NS IRRIG 1000ML POUR BTL (IV SOLUTION) ×1 IMPLANT
PACK TOTAL JOINT (CUSTOM PROCEDURE TRAY) ×1 IMPLANT
PAD ARMBOARD 7.5X6 YLW CONV (MISCELLANEOUS) ×2 IMPLANT
PAD COLD SHLDR WRAP-ON (PAD) ×1 IMPLANT
PIN DRILL HDLS TROCAR 75 4PK (PIN) IMPLANT
PIN SPEED NONRIM 65 STERL KNEE (PIN) IMPLANT
PIN SPEEDPIN RIMMED 3.2X30 SU (PIN) IMPLANT
SCREW FEMALE HEX FIX 25X2.5 (ORTHOPEDIC DISPOSABLE SUPPLIES) IMPLANT
SET HNDPC FAN SPRY TIP SCT (DISPOSABLE) ×1 IMPLANT
SOLUTION PRONTOSAN WOUND 350ML (IRRIGATION / IRRIGATOR) ×1 IMPLANT
STAPLER VISISTAT 35W (STAPLE) IMPLANT
STEM TIB PERS SZ E 5D LT (Screw) IMPLANT
SUCTION TUBE FRAZIER 10FR DISP (MISCELLANEOUS) ×1 IMPLANT
SUT ETHILON 2 0 FS 18 (SUTURE) IMPLANT
SUT MNCRL AB 3-0 PS2 27 (SUTURE) IMPLANT
SUT VIC AB 0 CT1 27 (SUTURE) ×2
SUT VIC AB 0 CT1 27XBRD ANBCTR (SUTURE) ×2 IMPLANT
SUT VIC AB 1 CTX 27 (SUTURE) ×3 IMPLANT
SUT VIC AB 2-0 CT1 27 (SUTURE) ×2
SUT VIC AB 2-0 CT1 TAPERPNT 27 (SUTURE) ×4 IMPLANT
SYR 50ML LL SCALE MARK (SYRINGE) ×2 IMPLANT
TOWEL GREEN STERILE (TOWEL DISPOSABLE) ×1 IMPLANT
TOWEL GREEN STERILE FF (TOWEL DISPOSABLE) ×1 IMPLANT
TRAY CATH INTERMITTENT SS 16FR (CATHETERS) IMPLANT
TRAY FOLEY MTR SLVR 16FR STAT (SET/KITS/TRAYS/PACK) IMPLANT
TUBE SUCT ARGYLE STRL (TUBING) ×1 IMPLANT
UNDERPAD 30X36 HEAVY ABSORB (UNDERPADS AND DIAPERS) ×1 IMPLANT
WARMER LAPAROSCOPE (MISCELLANEOUS) IMPLANT
YANKAUER SUCT BULB TIP NO VENT (SUCTIONS) ×2 IMPLANT

## 2022-07-18 NOTE — Anesthesia Preprocedure Evaluation (Addendum)
Anesthesia Evaluation  Patient identified by MRN, date of birth, ID band Patient awake    Reviewed: Allergy & Precautions, NPO status , Patient's Chart, lab work & pertinent test results  Airway Mallampati: III  TM Distance: >3 FB Neck ROM: Full    Dental no notable dental hx. (+) Teeth Intact, Dental Advisory Given   Pulmonary former smoker   Pulmonary exam normal breath sounds clear to auscultation       Cardiovascular hypertension, Pt. on medications Normal cardiovascular exam Rhythm:Regular Rate:Normal     Neuro/Psych  Headaches PSYCHIATRIC DISORDERS Anxiety Depression       GI/Hepatic negative GI ROS,,,(+)     substance abuse  alcohol use  Endo/Other  Hypothyroidism    Renal/GU negative Renal ROS  negative genitourinary   Musculoskeletal negative musculoskeletal ROS (+)    Abdominal   Peds  Hematology negative hematology ROS (+)   Anesthesia Other Findings   Reproductive/Obstetrics                             Anesthesia Physical Anesthesia Plan  ASA: 2  Anesthesia Plan: Spinal and Regional   Post-op Pain Management: Regional block* and Tylenol PO (pre-op)*   Induction:   PONV Risk Score and Plan: 2 and Treatment may vary due to age or medical condition, Midazolam, Propofol infusion, Ondansetron and Dexamethasone  Airway Management Planned: Natural Airway  Additional Equipment:   Intra-op Plan:   Post-operative Plan:   Informed Consent: I have reviewed the patients History and Physical, chart, labs and discussed the procedure including the risks, benefits and alternatives for the proposed anesthesia with the patient or authorized representative who has indicated his/her understanding and acceptance.     Dental advisory given  Plan Discussed with: CRNA  Anesthesia Plan Comments:        Anesthesia Quick Evaluation

## 2022-07-18 NOTE — Anesthesia Procedure Notes (Addendum)
Anesthesia Regional Block: Adductor canal block   Pre-Anesthetic Checklist: , timeout performed,  Correct Patient, Correct Site, Correct Laterality,  Correct Procedure, Correct Position, site marked,  Risks and benefits discussed,  Pre-op evaluation,  At surgeon's request and post-op pain management  Laterality: Left  Prep: Maximum Sterile Barrier Precautions used, chloraprep       Needles:  Injection technique: Single-shot  Needle Type: Echogenic Stimulator Needle     Needle Length: 9cm  Needle Gauge: 21     Additional Needles:   Procedures:,,,, ultrasound used (permanent image in chart),,    Narrative:  Start time: 07/18/2022 11:42 AM End time: 07/18/2022 11:45 AM Injection made incrementally with aspirations every 5 mL. Anesthesiologist: Elmer Picker, MD

## 2022-07-18 NOTE — Evaluation (Signed)
Physical Therapy Evaluation Patient Details Name: Anna Glover MRN: [id number] DOB: 12-Jul-1975 Today's Date: 07/18/2022  History of Present Illness  47 y.o. female presents to [facility] on 07/18/2022 for L TKA. PMH includes anxiety, depression, HTN, hypothyroidism.  Clinical Impression  Pt presents to PT with deficits in strength, power, ROM, gait, balance. PT evaluation is limited by pt experiencing pre-syncopal symptoms in standing, requiring return to supine. PT provides education on HEP in an effort to improve strength and ROM. Pt will benefit from further transfer and gait training tomorrow. PT anticipates pt will need at least 2 more acute PT sessions prior to discharge.       Recommendations for follow up therapy are one component of a multi-disciplinary discharge planning process, led by the attending physician.  Recommendations may be updated based on patient status, additional functional criteria and insurance authorization.  Follow Up Recommendations       Assistance Recommended at Discharge Intermittent Supervision/Assistance  Patient can return home with the following  A little help with walking and/or transfers;A lot of help with bathing/dressing/bathroom;Assistance with cooking/housework;Assist for transportation;Help with stairs or ramp for entrance    Equipment Recommendations BSC/3in1  Recommendations for Other Services       Functional Status Assessment Patient has had a recent decline in their functional status and demonstrates the ability to make significant improvements in function in a reasonable and predictable amount of time.     Precautions / Restrictions Precautions Precautions: Fall;Knee Precaution Booklet Issued: Yes (comment) Precaution Comments: near-syncope on eval Restrictions Weight Bearing Restrictions: Yes LLE Weight Bearing: Weight bearing as tolerated      Mobility  Bed Mobility Overal bed mobility: Needs Assistance Bed Mobility:  Supine to Sit, Sit to Supine     Supine to sit: Supervision Sit to supine: Min assist        Transfers Overall transfer level: Needs assistance Equipment used: Rolling walker (2 wheels) Transfers: Sit to/from Stand Sit to Stand: Min assist, From elevated surface                Ambulation/Gait Ambulation/Gait assistance:  (deferred 2/2 pre-syncopal symptoms)                Stairs            Wheelchair Mobility    Modified Rankin (Stroke Patients Only)       Balance Overall balance assessment: Needs assistance Sitting-balance support: No upper extremity supported, Feet supported Sitting balance-Leahy Scale: Good     Standing balance support: Bilateral upper extremity supported, Reliant on assistive device for balance Standing balance-Leahy Scale: Poor                               Pertinent Vitals/Pain Pain Assessment Pain Assessment: Faces Faces Pain Scale: Hurts whole lot Pain Location: L knee Pain Descriptors / Indicators: Aching Pain Intervention(s): Monitored during session    Home Living Family/patient expects to be discharged to:: Private residence Living Arrangements: Spouse/significant other;Children;Other relatives Available Help at Discharge: Family;Available 24 hours/day Type of Home: House Home Access: Stairs to enter Entrance Stairs-Rails: Can reach both Entrance Stairs-Number of Steps: 3   Home Layout: Multi-level;Able to live on main level with bedroom/bathroom Home Equipment: Rolling Walker (2 wheels);Shower seat      Prior Function Prior Level of Function : Independent/Modified Independent;Working/employed;Driving             Mobility Comments: works as a Cabin crew  education teacher K-5       Hand Dominance        Extremity/Trunk Assessment   Upper Extremity Assessment Upper Extremity Assessment: Overall WFL for tasks assessed    Lower Extremity Assessment Lower Extremity Assessment: LLE  deficits/detail LLE Deficits / Details: post-op ROM limitations as expected POD 0, generalized weakness but able to perform SLR    Cervical / Trunk Assessment Cervical / Trunk Assessment: Normal  Communication   Communication: No difficulties  Cognition Arousal/Alertness: Awake/alert Behavior During Therapy: WFL for tasks assessed/performed Overall Cognitive Status: Within Functional Limits for tasks assessed                                          General Comments General comments (skin integrity, edema, etc.): pt reports nausea, lightheadedness in standing. Symptoms continue in sitting with development of diaphoresis and pallor, PT assists pt into supine with improvement in symptoms. BP in supine 133/78    Exercises Other Exercises Other Exercises: PT provides TKR exercise packer and reviews exercises with patient and spouse   Assessment/Plan    PT Assessment Patient needs continued PT services  PT Problem List Decreased strength;Decreased range of motion;Decreased activity tolerance;Decreased balance;Decreased mobility;Decreased knowledge of use of DME;Pain       PT Treatment Interventions DME instruction;Gait training;Stair training;Functional mobility training;Therapeutic activities;Therapeutic exercise;Balance training;Neuromuscular re-education;Patient/family education    PT Goals (Current goals can be found in the Care Plan section)  Acute Rehab PT Goals Patient Stated Goal: to return to independence PT Goal Formulation: With patient Time For Goal Achievement: 07/22/22 Potential to Achieve Goals: Good    Frequency 7X/week     Co-evaluation               AM-PAC PT "6 Clicks" Mobility  Outcome Measure Help needed turning from your back to your side while in a flat bed without using bedrails?: A Little Help needed moving from lying on your back to sitting on the side of a flat bed without using bedrails?: A Little Help needed moving to and  from a bed to a chair (including a wheelchair)?: A Little Help needed standing up from a chair using your arms (e.g., wheelchair or bedside chair)?: A Little Help needed to walk in hospital room?: Total Help needed climbing 3-5 steps with a railing? : Total 6 Click Score: 14    End of Session   Activity Tolerance: Treatment limited secondary to medical complications (Comment) (pre-syncopal symptoms) Patient left: in bed;with call bell/phone within reach;with bed alarm set;with family/visitor present Nurse Communication: Mobility status PT Visit Diagnosis: Other abnormalities of gait and mobility (R26.89);Muscle weakness (generalized) (M62.81);Pain Pain - Right/Left: Left Pain - part of body: Knee    Time: 1701-1739 PT Time Calculation (min) (ACUTE ONLY): 38 min   Charges:   PT Evaluation $PT Eval Low Complexity: 1 Low          Arlyss Gandy, PT, DPT Acute Rehabilitation Office [phone]   Arlyss Gandy 07/18/2022, 5:49 PM

## 2022-07-18 NOTE — Anesthesia Procedure Notes (Signed)
Procedure Name: MAC Date/Time: 07/18/2022 12:30 PM  Performed by: Macie Burows, CRNAPre-anesthesia Checklist: Patient identified, Emergency Drugs available, Suction available, Patient being monitored and Timeout performed Patient Re-evaluated:Patient Re-evaluated prior to induction Oxygen Delivery Method: Simple face mask Induction Type: IV induction Placement Confirmation: positive ETCO2 and breath sounds checked- equal and bilateral

## 2022-07-18 NOTE — Discharge Instructions (Signed)

## 2022-07-18 NOTE — Transfer of Care (Signed)
Immediate Anesthesia Transfer of Care Note  Patient: Anna Glover  Procedure(s) Performed: LEFT TOTAL KNEE REPLACEMENT (Left: Knee)  Patient Location: PACU  Anesthesia Type:MAC and Spinal  Level of Consciousness: awake, alert , and oriented  Airway & Oxygen Therapy: Patient Spontanous Breathing  Post-op Assessment: Report given to RN and Post -op Vital signs reviewed and stable  Post vital signs: Reviewed and stable  Last Vitals:  Vitals Value Taken Time  BP 128/91 07/18/22 1508  Temp    Pulse 61 07/18/22 1512  Resp 17 07/18/22 1512  SpO2 96 % 07/18/22 1512  Vitals shown include unvalidated device data.  Last Pain:  Vitals:   07/18/22 1048  TempSrc:   PainSc: 0-No pain         Complications: No notable events documented.

## 2022-07-18 NOTE — H&P (Signed)
PREOPERATIVE H&P  Chief Complaint: left knee osteoarthritis  HPI: Anna Glover is a 47 y.o. female who presents for surgical treatment of left knee osteoarthritis.  She denies any changes in medical history.  Past Medical History:  Diagnosis Date   Anxiety    Depression    Hypertension    Hypothyroidism    Osteoarthritis 2012   L knee   Past Surgical History:  Procedure Laterality Date   ADENOIDECTOMY  1991   BIOPSY  01/04/2021   Procedure: BIOPSY;  Surgeon: Tressia Danas, MD;  Location: [facility] ENDOSCOPY;  Service: Gastroenterology;;   CESAREAN SECTION  02/25/2003   CESAREAN SECTION  05/01/2009   CHOLECYSTECTOMY  07/15/2012   ESOPHAGOGASTRODUODENOSCOPY (EGD) WITH PROPOFOL N/A 01/04/2021   Procedure: ESOPHAGOGASTRODUODENOSCOPY (EGD) WITH PROPOFOL;  Surgeon: Tressia Danas, MD;  Location: [facility] ENDOSCOPY;  Service: Gastroenterology;  Laterality: N/A;   Social History   Socioeconomic History   Marital status: Married    Spouse name: Terrance    Number of children: 2   Years of education: Masters    Highest education level: Not on file  Occupational History   Occupation: Midwife  Tobacco Use   Smoking status: Former    Years: 10    Types: Cigarettes    Quit date: 07/10/2009    Years since quitting: 13.0   Smokeless tobacco: Never  Vaping Use   Vaping Use: Never used  Substance and Sexual Activity   Alcohol use: Yes    Alcohol/week: 1.0 standard drink of alcohol    Types: 1 Glasses of wine per week    Comment: "too much" - a bottle of wine a night   Drug use: No   Sexual activity: Yes    Birth control/protection: None  Other Topics Concern   Not on file  Social History Narrative   Lives at home with husband   Caffeine use: Drinks coffee every day    Social Determinants of Corporate investment banker Strain: Not on file  Food Insecurity: Not on file  Transportation Needs: Not on file  Physical Activity: Not on file  Stress: Not on file   Social Connections: Not on file   Family History  Problem Relation Age of Onset   Hypertension Mother    Hyperlipidemia Mother    Hypertension Father    Heart disease Father    Pulmonary fibrosis Father    Migraines Neg Hx    Allergies  Allergen Reactions   Cefdinir Other (See Comments)    Rectal bleeding   Erythromycin Nausea And Vomiting   Penicillins Hives   Sulfa Antibiotics Other (See Comments)    Rectal Bleeding    Clindamycin/Lincomycin Nausea And Vomiting   Prior to Admission medications   Medication Sig Start Date End Date Taking? Authorizing Provider  amLODipine (NORVASC) 10 MG tablet Take 10 mg by mouth at bedtime.   Yes [provider]  aspirin EC 81 MG tablet Take 1 tablet (81 mg total) by mouth 2 (two) times daily. To be taken after surgery to prevent blood clots 07/12/22 07/12/23  Cristie Hem, PA-C  docusate sodium (COLACE) 100 MG capsule Take 1 capsule (100 mg total) by mouth daily as needed. 07/12/22 07/12/23  Cristie Hem, PA-C  levothyroxine (SYNTHROID) 175 MCG tablet Take 175 mcg by mouth daily before breakfast.   Yes [provider]  loratadine (CLARITIN) 10 MG tablet Take 10 mg by mouth at bedtime.   Yes [provider]  methocarbamol (  ROBAXIN-750) 750 MG tablet Take 1 tablet (750 mg total) by mouth 3 (three) times daily as needed for muscle spasms. 07/12/22   Cristie Hem, PA-C  Multiple Vitamin (MULTIVITAMIN) tablet Take 1 tablet by mouth daily.   Yes [provider]  omeprazole (PRILOSEC OTC) 20 MG tablet Take 20 mg by mouth daily as needed (acid reflux).   Yes [provider]  ondansetron (ZOFRAN) 4 MG tablet Take 1 tablet (4 mg total) by mouth every 8 (eight) hours as needed for nausea or vomiting. 07/12/22   Cristie Hem, PA-C  oxyCODONE-acetaminophen (PERCOCET) 5-325 MG tablet Take 1-2 tablets by mouth every 6 (six) hours as needed. To be taken after surgery 07/12/22   Cristie Hem, PA-C   sertraline (ZOLOFT) 50 MG tablet Take 50 mg by mouth at bedtime.   Yes [provider]  fluticasone (FLONASE) 50 MCG/ACT nasal spray Place 2 sprays into both nostrils daily.    [provider]     Positive ROS: All other systems have been reviewed and were otherwise negative with the exception of those mentioned in the HPI and as above.  Physical Exam: General: Alert, no acute distress Cardiovascular: No pedal edema Respiratory: No cyanosis, no use of accessory musculature GI: abdomen soft Skin: No lesions in the area of chief complaint Neurologic: Sensation intact distally Psychiatric: Patient is competent for consent with normal mood and affect Lymphatic: no lymphedema  MUSCULOSKELETAL: exam stable  Assessment: left knee osteoarthritis  Plan: Plan for Procedure(s): LEFT UNICOMPARTMENTAL vs TOTAL KNEE ARTHROPLASTY  The risks benefits and alternatives were discussed with the patient including but not limited to the risks of nonoperative treatment, versus surgical intervention including infection, bleeding, nerve injury,  blood clots, cardiopulmonary complications, morbidity, mortality, among others, and they were willing to proceed.   Glee Arvin, MD 07/18/2022 10:21 AM

## 2022-07-18 NOTE — Op Note (Signed)
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 CPT [phone]  Surgeon: N. Glee Arvin, MD  Assist: Hart Carwin, RNFA  Anesthesia: Spinal, regional  Tourniquet time: see anesthesia record  Implants used: Zimmer persona Femur: CR 9 nickel free cemented Tibia: E nickel free pressfit Patella: 35 mm nickel free pressfit Polyethylene: 11 mm, medial congruent  Indication: Anna Glover is a 47 y.o. year old female with a history of knee pain. Having failed conservative management, the patient elected to proceed with a partial vs total knee arthroplasty.  We have reviewed the risk and benefits of the surgery and they elected to proceed after voicing understanding.  Procedure:  After informed consent was obtained and understanding of the risk were voiced including but not limited to bleeding, infection, damage to surrounding structures including nerves and vessels, blood clots, leg length inequality and the failure to achieve desired results, the operative extremity was marked with verbal confirmation of the patient in the holding area.   The patient was then brought to the operating room and transported to the operating room table in the supine position.  A tourniquet was applied to the operative extremity around the upper thigh. The operative limb was then prepped and draped in the usual sterile fashion and preoperative antibiotics were administered.  A time out was performed prior to the start of surgery confirming the correct extremity, preoperative antibiotic administration, as well as team members, implants and instruments available for the case. Correct surgical site was also confirmed with preoperative radiographs. The limb was then elevated for exsanguination and  the tourniquet was inflated. A midline incision was made and a standard medial parapatellar approach was performed with a 10 blade.  The inter-meniscal ligament was left intact initially.  Inspection of the chondral surfaces showed grade 4 changes of the medial and patellofemoral compartments.  The decision was made to perform a total knee replacement.  The patella was prepared and sized to a 35 mm.  A cover was placed on the patella for protection from retractors.  We then turned our attention to the femur. Posterior cruciate ligament was sacrificed. Start site was drilled in the femur and the intramedullary distal femoral cutting guide was placed, set at 5 degrees valgus, taking 10 mm of distal resection. The distal cut was made. Osteophytes were then removed.   Next, the proximal tibial cutting guide was placed with appropriate slope, varus/valgus alignment and depth of resection. The proximal tibial cut was made taking 4 mm off the low side. Gap blocks were then used to assess the extension gap and alignment, and appropriate soft tissue releases were performed. Attention was turned back to the femur, which was sized using the sizing guide to a size 9. Appropriate rotation of the femoral component was determined using epicondylar axis, Whiteside's line, and assessing the flexion gap under ligament tension. The appropriate size 4-in-1 cutting block was placed and cuts were made.  Posterior femoral osteophytes and uncapped bone were then removed with the curved osteotome.  Trial components were placed, and stability was checked in full extension, mid-flexion, and deep flexion. Proper tibial rotation was determined and marked.  The patella tracked well without a lateral release. Trial components were then removed and tibial preparation performed.  The tibia was sized for a size E component.  The bony surfaces were irrigated with  a pulse lavage and then dried. Final components were placed.  The stability of the  construct was re-evaluated throughout a range of motion and found to be acceptable. The trial liner was removed, the knee was copiously irrigated, and the knee was re-evaluated for any excess bone debris. The real polyethylene liner, 11 mm thick, was inserted and checked to ensure the locking mechanism had engaged appropriately. The tourniquet was deflated and hemostasis was achieved. The wound was irrigated with normal saline.  One gram of vancomycin powder was placed in the surgical bed.  Topical 0.25% bupivacaine and meloxicam was placed in the joint for postoperative pain.  Capsular closure was performed with a #1 vicryl, subcutaneous fat closed with a 0 vicryl suture, then subcutaneous tissue closed with interrupted 2.0 vicryl suture. The skin was then closed with a 3.0 monocryl and steri strips. A sterile dressing was applied.  The patient was awakened in the operating room and taken to recovery in stable condition. All sponge, needle, and instrument counts were correct at the end of the case.  Tessa Lerner was necessary for opening, closing, retracting, limb positioning and overall facilitation and completion of the surgery.  Position: supine  Complications: none.  Time Out: performed   Drains/Packing: none  Estimated blood loss: minimal  Returned to Recovery Room: in good condition.   Antibiotics: yes   Mechanical VTE (DVT) Prophylaxis: sequential compression devices, TED thigh-high  Chemical VTE (DVT) Prophylaxis: aspirin  Fluid Replacement  Crystalloid: see anesthesia record Blood: none  FFP: none   Specimens Removed: 1 to pathology   Sponge and Instrument Count Correct? yes   PACU: portable radiograph - knee AP and Lateral   Plan/RTC: Return in 2 weeks for wound check.   Weight Bearing/Load Lower Extremity: full   Implant Name Type Inv. Item Serial No. Manufacturer Lot No. LRB No. Used Action  CEMENT BONE REFOBACIN [id number] [place] - [id number] Cement CEMENT BONE REFOBACIN  [id number] [place]  ZIMMER RECON(ORTH,TRAU,BIO,SG) [id number] Left 2 Implanted  INSERT MED AS PERS SZ 8-11 LT - [id number] Insert INSERT MED AS PERS SZ 8-11 LT  ZIMMER RECON(ORTH,TRAU,BIO,SG) [id number] Left 1 Implanted  STEM TIB PERS SZ E 5D LT - [id number] Screw STEM TIB PERS SZ E 5D LT  ZIMMER RECON(ORTH,TRAU,BIO,SG) [id number] Left 1 Implanted  COMP FEM CMT CR STD 9 LT - [id number] Joint COMP FEM CMT CR STD 9 LT  ZIMMER RECON(ORTH,TRAU,BIO,SG) [id number] Left 1 Implanted  COMP PATELLAR 10X35 METAL - [id number] Joint COMP PATELLAR 10X35 METAL  ZIMMER RECON(ORTH,TRAU,BIO,SG) [id number] Left 1 Implanted    N. Glee Arvin, MD [facility] 2:28 PM

## 2022-07-18 NOTE — Anesthesia Postprocedure Evaluation (Signed)
Anesthesia Post Note  Patient: Anna Glover  Procedure(s) Performed: LEFT TOTAL KNEE REPLACEMENT (Left: Knee)     Patient location during evaluation: PACU Anesthesia Type: Regional and Spinal Level of consciousness: oriented and awake and alert Pain management: pain level controlled Vital Signs Assessment: post-procedure vital signs reviewed and stable Respiratory status: spontaneous breathing, respiratory function stable and patient connected to nasal cannula oxygen Cardiovascular status: blood pressure returned to baseline and stable Postop Assessment: no headache, no backache and no apparent nausea or vomiting Anesthetic complications: no  No notable events documented.  Last Vitals:  Vitals:   07/18/22 1530 07/18/22 1545  BP: 134/85 (!) 143/95  Pulse: (!) 56 (!) 54  Resp: 14 15  Temp:    SpO2: 97% 98%    Last Pain:  Vitals:   07/18/22 1545  TempSrc:   PainSc: 2     LLE Motor Response: Purposeful movement (07/18/22 1545) LLE Sensation: Decreased (07/18/22 1545) RLE Motor Response: Purposeful movement (07/18/22 1545) RLE Sensation: Decreased (07/18/22 1545) L Sensory Level: S1-Sole of foot, small toes (07/18/22 1545) R Sensory Level: S1-Sole of foot, small toes (07/18/22 1545)  Keiasia Christianson L Cody Oliger

## 2022-07-18 NOTE — Anesthesia Procedure Notes (Signed)
Spinal  Patient location during procedure: OR Start time: 07/18/2022 12:30 PM End time: 07/18/2022 12:33 PM Reason for block: surgical anesthesia Staffing Performed: anesthesiologist  Anesthesiologist: Elmer Picker, MD Performed by: Elmer Picker, MD Authorized by: Elmer Picker, MD   Preanesthetic Checklist Completed: patient identified, IV checked, risks and benefits discussed, surgical consent, monitors and equipment checked, pre-op evaluation and timeout performed Spinal Block Patient position: sitting Prep: DuraPrep and site prepped and draped Patient monitoring: cardiac monitor, continuous pulse ox and blood pressure Approach: midline Location: L3-4 Injection technique: single-shot Needle Needle type: Pencan  Needle gauge: 24 G Needle length: 9 cm Assessment Sensory level: T6 Events: CSF return Additional Notes Functioning IV was confirmed and monitors were applied. Sterile prep and drape, including hand hygiene and sterile gloves were used. The patient was positioned and the spine was prepped. The skin was anesthetized with lidocaine.  Free flow of clear CSF was obtained prior to injecting local anesthetic into the CSF.  The spinal needle aspirated freely following injection.  The needle was carefully withdrawn.  The patient tolerated the procedure well.

## 2022-07-19 LAB — CBC
HCT: 43.4 % (ref 36.0–46.0)
Hemoglobin: 15.3 g/dL — ABNORMAL HIGH (ref 12.0–15.0)
MCH: 32.8 pg (ref 26.0–34.0)
MCHC: 35.3 g/dL (ref 30.0–36.0)
MCV: 92.9 fL (ref 80.0–100.0)
Platelets: 226 10*3/uL (ref 150–400)
RBC: 4.67 MIL/uL (ref 3.87–5.11)
RDW: 11.7 % (ref 11.5–15.5)
WBC: 16.5 10*3/uL — ABNORMAL HIGH (ref 4.0–10.5)
nRBC: 0 % (ref 0.0–0.2)

## 2022-07-19 NOTE — TOC Transition Note (Signed)
Transition of Care [facility]) - CM/SW Discharge Note   Patient Details  Name: Anna Glover MRN: [id number] Date of Birth: 09-03-1975  [facility] CM/SW Contact:  Lawerance Sabal, RN Phone Number: 07/19/2022, 8:53 AM   Clinical Narrative:     Spoke to patient at bedside. She confirmed she will need 3/1, ordered to be delivered to the room through [facility]. She has RW at home.  HH set up pre op through [facility] and liaison has been notified that patient will likely leave today (once cleared by PT)   Final next level of care: Home w Home Health Services Barriers to Discharge: No Barriers Identified   Patient Goals and CMS Choice CMS Medicare.gov Compare Post Acute Care list provided to:: Patient Choice offered to / list presented to : Patient  Discharge Placement                         Discharge Plan and Services Additional resources added to the After Visit Summary for                  DME Arranged: 3-N-1 DME Agency: [employer] Date DME Agency Contacted: 07/19/22 Time DME Agency Contacted: [phone] Representative spoke with at DME Agency: Vaughan Basta HH Arranged: PT, OT HH Agency: [facility] Date [facility] Agency Contacted: 07/19/22 Time HH Agency Contacted: [phone] Representative spoke with at [facility] Agency: Tresa Endo  Social Determinants of Health (SDOH) Interventions SDOH Screenings   Food Insecurity: No Food Insecurity (07/18/2022)  Housing: Low Risk  (07/18/2022)  Transportation Needs: No Transportation Needs (07/18/2022)  Utilities: Not At Risk (07/18/2022)  Tobacco Use: Medium Risk (07/18/2022)     Readmission Risk Interventions     No data to display

## 2022-07-19 NOTE — Progress Notes (Signed)
Physical Therapy Treatment Patient Details Name: Anna Glover MRN: [id number] DOB: [birth year] Today's Date: 07/19/2022   History of Present Illness 47 y.o. female presents to [facility] hospital on 07/18/2022 for L TKA. PMH includes anxiety, depression, HTN, hypothyroidism.    PT Comments    Pt received in supine and agreeable to session. Pt making good progress towards functional mobility goals this session. Pt reporting anxiety with mobility, however is able to perform all mobility tasks with up to min guard for safety. Pt was able to tolerate gait distance with good BUE support and step-through gait pattern. Pt requesting to use the bathroom during the session and is able to perform pericare and hand hygiene with supervision. Pt able to perform supine and seated exercises to promote quad activation and increase ROM. Pt with good carryover of positioning LLE in extension. Pt would benefit from additional session tomorrow for gait progression and stair training before discharge. Pt continues to benefit from PT services to progress toward functional mobility goals.     Recommendations for follow up therapy are one component of a multi-disciplinary discharge planning process, led by the attending physician.  Recommendations may be updated based on patient status, additional functional criteria and insurance authorization.     Assistance Recommended at Discharge Intermittent Supervision/Assistance  Patient can return home with the following A little help with walking and/or transfers;A lot of help with bathing/dressing/bathroom;Assistance with cooking/housework;Assist for transportation;Help with stairs or ramp for entrance   Equipment Recommendations  BSC/3in1    Recommendations for Other Services       Precautions / Restrictions Precautions Precautions: Fall;Knee Precaution Booklet Issued: Yes (comment) Precaution Comments: orthostatic Restrictions Weight Bearing Restrictions: Yes LLE Weight  Bearing: Weight bearing as tolerated     Mobility  Bed Mobility Overal bed mobility: Modified Independent Bed Mobility: Supine to Sit, Sit to Supine     Supine to sit: Supervision Sit to supine: Supervision   General bed mobility comments: Pt sitting EOB and returning to supine x2 with supervision. Pt with good ability to manage LLE    Transfers Overall transfer level: Needs assistance Equipment used: Rolling walker (2 wheels) Transfers: Sit to/from Stand Sit to Stand: Supervision           General transfer comment: from EOB and toilet with supervision for safety. Cues to place LLE forward to decrease pain    Ambulation/Gait Ambulation/Gait assistance: Min guard Gait Distance (Feet): 100 Feet Assistive device: Rolling walker (2 wheels) Gait Pattern/deviations: Step-through pattern, Antalgic, Decreased stance time - left       General Gait Details: Pt demonstrating slow, steady step-through pattern with heavy reliance on BUEs to offload LLE.      Balance Overall balance assessment: Needs assistance Sitting-balance support: No upper extremity supported, Feet supported Sitting balance-Leahy Scale: Good Sitting balance - Comments: sitting EOB   Standing balance support: During functional activity, Bilateral upper extremity supported, Reliant on assistive device for balance Standing balance-Leahy Scale: Fair Standing balance comment: with RW support. Able to stand without UE support at sink for hand hygiene                            Cognition Arousal/Alertness: Awake/alert Behavior During Therapy: WFL for tasks assessed/performed Overall Cognitive Status: Within Functional Limits for tasks assessed  Exercises Total Joint Exercises Ankle Circles/Pumps: AROM, Supine, Both, 5 reps Quad Sets: AROM, Supine, Left, 5 reps Heel Slides: AROM, Supine, Left, 5 reps Straight Leg Raises: AROM, Seated,  Left, 5 reps Long Arc Quad: AROM, Seated, Left, 5 reps General Exercises - Lower Extremity Hip Flexion/Marching: AROM, Seated, Left, 5 reps    General Comments General comments (skin integrity, edema, etc.): Pt with no reports of dizziness or nausea throughout session      Pertinent Vitals/Pain Pain Assessment Pain Assessment: Faces Pain Score: 3  Faces Pain Scale: Hurts even more Pain Location: L knee Pain Descriptors / Indicators: Aching, Sharp Pain Intervention(s): Monitored during session, Repositioned     PT Goals (current goals can now be found in the care plan section) Acute Rehab PT Goals Patient Stated Goal: to return to independence PT Goal Formulation: With patient Time For Goal Achievement: 07/22/22 Potential to Achieve Goals: Good Progress towards PT goals: Progressing toward goals    Frequency    7X/week      PT Plan Current plan remains appropriate       AM-PAC PT "6 Clicks" Mobility   Outcome Measure  Help needed turning from your back to your side while in a flat bed without using bedrails?: None Help needed moving from lying on your back to sitting on the side of a flat bed without using bedrails?: None Help needed moving to and from a bed to a chair (including a wheelchair)?: A Little Help needed standing up from a chair using your arms (e.g., wheelchair or bedside chair)?: A Little Help needed to walk in hospital room?: A Little Help needed climbing 3-5 steps with a railing? : A Lot 6 Click Score: 19    End of Session Equipment Utilized During Treatment: Gait belt Activity Tolerance: Patient tolerated treatment well Patient left: in chair;with family/visitor present;with call bell/phone within reach Nurse Communication: Mobility status PT Visit Diagnosis: Other abnormalities of gait and mobility (R26.89);Muscle weakness (generalized) (M62.81);Pain Pain - Right/Left: Left Pain - part of body: Knee     Time: 1610-9604 PT Time Calculation  (min) (ACUTE ONLY): 35 min  Charges:  $Gait Training: 8-22 mins $Therapeutic Activity: 8-22 mins                     Johny Shock, PTA Acute Rehabilitation Services Secure Chat Preferred  [phone]    Johny Shock 07/19/2022, 1:33 PM

## 2022-07-19 NOTE — Plan of Care (Addendum)
?  Problem: Education: ?Goal: Knowledge of the prescribed therapeutic regimen will improve ?Outcome: Progressing ?  ?Problem: Activity: ?Goal: Ability to avoid complications of mobility impairment will improve ?Outcome: Progressing ?  ?Problem: Pain Management: ?Goal: Pain level will decrease with appropriate interventions ?Outcome: Progressing ?  ?Problem: Skin Integrity: ?Goal: Will show signs of wound healing ?Outcome: Progressing ?  ?

## 2022-07-19 NOTE — Plan of Care (Signed)

## 2022-07-19 NOTE — Evaluation (Signed)
Occupational Therapy Evaluation Patient Details Name: Anna Glover MRN: [id number] DOB: July 27, 1975 Today's Date: 07/19/2022   History of Present Illness 47 y.o. female presents to [facility] hospital on 07/18/2022 for L TKA. PMH includes anxiety, depression, HTN, hypothyroidism.   Clinical Impression   Pt s/p above diagnosis. Pt not feeling well at beginning, BP supine 160/96, sitting EOB 167/99, standing 135/69, Pt felt nauseous, lightheaded. After episode of emesis Pt felt 100% better, able to ambulate to toilet, stand at sink performing ADLs, sit on EOB as tolerated. Pt had sharp pain sitting down at toilet in L knee, nursing checked wound during session, no issues. Pt doing well, supervision for most ADLs, min A for LLE dressing/bathing. Pt has RW and BSC, has family at home 24/7, able to live on first floor as needed. Pt does not need continued OT or follow up.      Recommendations for follow up therapy are one component of a multi-disciplinary discharge planning process, led by the attending physician.  Recommendations may be updated based on patient status, additional functional criteria and insurance authorization.   Assistance Recommended at Discharge Set up Supervision/Assistance  Patient can return home with the following A little help with walking and/or transfers;A little help with bathing/dressing/bathroom;Assistance with cooking/housework;Assist for transportation;Help with stairs or ramp for entrance    Functional Status Assessment  Patient has had a recent decline in their functional status and demonstrates the ability to make significant improvements in function in a reasonable and predictable amount of time.  Equipment Recommendations  None recommended by OT    Recommendations for Other Services       Precautions / Restrictions Precautions Precautions: Fall;Knee Precaution Booklet Issued: Yes (comment) Precaution Comments: orthostatic Restrictions Weight Bearing  Restrictions: Yes LLE Weight Bearing: Weight bearing as tolerated      Mobility Bed Mobility Overal bed mobility: Modified Independent             General bed mobility comments: able to get in/out of bed mod I    Transfers Overall transfer level: Needs assistance Equipment used: Rolling walker (2 wheels) Transfers: Sit to/from Stand Sit to Stand: Supervision           General transfer comment: supervision, was lightheaded and dizzy, but subsided after emesis episode      Balance Overall balance assessment: Needs assistance Sitting-balance support: No upper extremity supported, Feet supported Sitting balance-Leahy Scale: Good Sitting balance - Comments: sitting EOB   Standing balance support: During functional activity Standing balance-Leahy Scale: Fair Standing balance comment: able to stand at sink unsupported performing grooming, no LOB                           ADL either performed or assessed with clinical judgement   ADL Overall ADL's : Needs assistance/impaired Eating/Feeding: Independent   Grooming: Supervision/safety;Standing   Upper Body Bathing: Supervision/ safety   Lower Body Bathing: Minimal assistance   Upper Body Dressing : Set up   Lower Body Dressing: Minimal assistance   Toilet Transfer: Supervision/safety;Rolling walker (2 wheels);Ambulation   Toileting- Clothing Manipulation and Hygiene: Modified independent       Functional mobility during ADLs: Supervision/safety General ADL Comments: Pt supervision for transfers, standing ADLs, requires assistance with LLE for dressing/bathing     Vision Baseline Vision/History: 1 Wears glasses Ability to See in Adequate Light: 0 Adequate Patient Visual Report: No change from baseline       Perception  Praxis      Pertinent Vitals/Pain Pain Assessment Pain Assessment: 0-10 Pain Score: 6  Faces Pain Scale: Hurts even more Pain Location: L knee Pain Descriptors /  Indicators: Aching, Sharp Pain Intervention(s): Monitored during session     Hand Dominance Left   Extremity/Trunk Assessment             Communication Communication Communication: No difficulties   Cognition Arousal/Alertness: Awake/alert Behavior During Therapy: WFL for tasks assessed/performed Overall Cognitive Status: Within Functional Limits for tasks assessed                                       General Comments  Pt reports nausea, lightheadedness, and diaphoresis sitting EOB x2. BP upon first sitting trial at EOB 108/67 and second trial 97/59 with BP improving WFL when returned to supine.    Exercises     Shoulder Instructions      Home Living Family/patient expects to be discharged to:: Private residence Living Arrangements: Spouse/significant other;Children;Other relatives Available Help at Discharge: Family;Available 24 hours/day Type of Home: House Home Access: Stairs to enter [employer] of Steps: 6 Entrance Stairs-Rails: Left Home Layout: Multi-level;Able to live on main level with bedroom/bathroom     Bathroom Shower/Tub: Chief Strategy Officer: Standard Bathroom Accessibility: Yes How Accessible: Accessible via walker Home Equipment: Rolling Walker (2 wheels);Shower seat   Additional Comments: Pt lives at home with husband and other family who can help as needed 24/7      Prior Functioning/Environment Prior Level of Function : Independent/Modified Independent;Working/employed;Driving                        OT Problem List: Decreased range of motion;Decreased activity tolerance;Impaired balance (sitting and/or standing);Pain      OT Treatment/Interventions:      OT Goals(Current goals can be found in the care plan section) Acute Rehab OT Goals Patient Stated Goal: to return home and decrease pain OT Goal Formulation: With patient Time For Goal Achievement: 08/02/22 Potential to Achieve Goals:  Good  OT Frequency:      Co-evaluation              AM-PAC OT "6 Clicks" Daily Activity     Outcome Measure Help from another person eating meals?: None Help from another person taking care of personal grooming?: A Little Help from another person toileting, which includes using toliet, bedpan, or urinal?: A Little Help from another person bathing (including washing, rinsing, drying)?: A Little Help from another person to put on and taking off regular upper body clothing?: A Little Help from another person to put on and taking off regular lower body clothing?: A Little 6 Click Score: 19   End of Session Equipment Utilized During Treatment: Rolling walker (2 wheels);Gait belt Nurse Communication: Mobility status  Activity Tolerance: Patient tolerated treatment well Patient left: in bed;with call bell/phone within reach;with family/visitor present  OT Visit Diagnosis: Other abnormalities of gait and mobility (R26.89);Pain Pain - Right/Left: Left Pain - part of body: Knee                Time: 1308-6578 OT Time Calculation (min): 30 min Charges:  OT General Charges $OT Visit: 1 Visit OT Evaluation $OT Eval Low Complexity: 1 Low OT Treatments $Self Care/Home Management : 8-22 mins  [place], OTR/L   Alexis Goodell 07/19/2022, 11:17 AM

## 2022-07-19 NOTE — Progress Notes (Signed)
Physical Therapy Treatment Patient Details Name: Anna Glover MRN: [id number] DOB: 03/21/1975 Today's Date: 07/19/2022   History of Present Illness 47 y.o. female presents to [facility] hospital on 07/18/2022 for L TKA. PMH includes anxiety, depression, HTN, hypothyroidism.    PT Comments    Pt received in supine and agreeable to session. Pt able to perform bed mobility with supervision and demonstrated good ability to manage LLE without UE or outside assist. Pt able to sit EOB for ~3 mins, however reports pre-syncopal symptoms and BP noted to be 108/67. Pt returned to supine and HOB lowered with BP returning [facility] Pt motivated to progress mobility and reports need to use the bathroom. Pt able to sit EOB again and prepares to stand, however symptoms return and BP noted to be 97/59. Pt returned to supine and was assisted with using the bedpan to void urine. Pt encouraged to eat breakfast and keep the HOB elevated until next session. Will progress mobility as able this afternoon. Pt continues to benefit from PT services to progress toward functional mobility goals.     Recommendations for follow up therapy are one component of a multi-disciplinary discharge planning process, led by the attending physician.  Recommendations may be updated based on patient status, additional functional criteria and insurance authorization.     Assistance Recommended at Discharge Intermittent Supervision/Assistance  Patient can return home with the following A little help with walking and/or transfers;A lot of help with bathing/dressing/bathroom;Assistance with cooking/housework;Assist for transportation;Help with stairs or ramp for entrance   Equipment Recommendations  BSC/3in1    Recommendations for Other Services       Precautions / Restrictions Precautions Precautions: Fall;Knee Precaution Booklet Issued: Yes (comment) Precaution Comments: orthostatic Restrictions Weight Bearing Restrictions: Yes LLE Weight  Bearing: Weight bearing as tolerated     Mobility  Bed Mobility Overal bed mobility: Needs Assistance Bed Mobility: Supine to Sit, Sit to Supine     Supine to sit: Supervision Sit to supine: Supervision   General bed mobility comments: Pt sitting EOB and returning to supine x2 with supervision. Pt with good ability to manage LLE    Transfers Overall transfer level: Needs assistance Equipment used: Rolling walker (2 wheels)               General transfer comment: unable due to BP drop        Balance Overall balance assessment: Needs assistance Sitting-balance support: No upper extremity supported, Feet supported Sitting balance-Leahy Scale: Good Sitting balance - Comments: sitting EOB       Standing balance comment: not tested                            Cognition Arousal/Alertness: Awake/alert Behavior During Therapy: WFL for tasks assessed/performed Overall Cognitive Status: Within Functional Limits for tasks assessed                                          Exercises      General Comments General comments (skin integrity, edema, etc.): Pt reports nausea, lightheadedness, and diaphoresis sitting EOB x2. BP upon first sitting trial at EOB 108/67 and second trial 97/59 with BP improving WFL when returned to supine.      Pertinent Vitals/Pain Pain Assessment Pain Assessment: 0-10 Pain Score: 3  Pain Location: L knee Pain Descriptors / Indicators: Aching Pain Intervention(s): Monitored  during session, Repositioned, Ice applied     PT Goals (current goals can now be found in the care plan section) Acute Rehab PT Goals Patient Stated Goal: to return to independence PT Goal Formulation: With patient Time For Goal Achievement: 07/22/22 Potential to Achieve Goals: Good Progress towards PT goals: Not progressing toward goals - comment (limited by pre-syncopal symptoms and BP drop)    Frequency    7X/week      PT Plan  Current plan remains appropriate       AM-PAC PT "6 Clicks" Mobility   Outcome Measure  Help needed turning from your back to your side while in a flat bed without using bedrails?: None Help needed moving from lying on your back to sitting on the side of a flat bed without using bedrails?: A Little Help needed moving to and from a bed to a chair (including a wheelchair)?: A Little Help needed standing up from a chair using your arms (e.g., wheelchair or bedside chair)?: A Little Help needed to walk in hospital room?: Total Help needed climbing 3-5 steps with a railing? : Total 6 Click Score: 15    End of Session   Activity Tolerance: Treatment limited secondary to medical complications (Comment) (pre-syncopal symptoms) Patient left: in bed;with call bell/phone within reach Nurse Communication: Mobility status PT Visit Diagnosis: Other abnormalities of gait and mobility (R26.89);Muscle weakness (generalized) (M62.81);Pain Pain - Right/Left: Left Pain - part of body: Knee     Time: 0981-1914 PT Time Calculation (min) (ACUTE ONLY): 25 min  Charges:  $Therapeutic Activity: 23-37 mins                     Johny Shock, PTA Acute Rehabilitation Services Secure Chat Preferred  [phone]    Johny Shock 07/19/2022, 9:08 AM

## 2022-07-19 NOTE — Discharge Summary (Addendum)
Patient ID: Anna Glover MRN: [id number] DOB/AGE: Feb 13, 1976 47 y.o.  Admit date: 07/18/2022 Discharge date: 07/21/2022  Admission Diagnoses:  Principal Problem:   Primary osteoarthritis of left knee Active Problems:   Alcohol dependence syndrome (HCC)   Essential hypertension   Hypothyroidism (acquired)   Depression with anxiety   Status post total left knee replacement   Chest pain, rule out acute myocardial infarction   Near syncope   S/P total knee arthroplasty, left   Discharge Diagnoses:  Same  Past Medical History:  Diagnosis Date   Anxiety    Depression    Hypertension    Hypothyroidism    Osteoarthritis 2012   L knee    Surgeries: Procedure(s): LEFT TOTAL KNEE REPLACEMENT on 07/18/2022   Consultants: Treatment Team:  Synetta Fail, MD  Discharged Condition: Improved  Hospital Course: Anna Glover is an 47 y.o. female who was admitted 07/18/2022 for operative treatment ofPrimary osteoarthritis of left knee. Patient has severe unremitting pain that affects sleep, daily activities, and work/hobbies. After pre-op clearance the patient was taken to the operating room on 07/18/2022 and underwent  Procedure(s): LEFT TOTAL KNEE REPLACEMENT.    Patient was given perioperative antibiotics:  Anti-infectives (From admission, onward)    Start     Dose/Rate Route Frequency Ordered Stop   07/18/22 1700  ceFAZolin (ANCEF) IVPB 2g/100 mL premix        2 g 200 mL/hr over 30 Minutes Intravenous Every 6 hours 07/18/22 1608 07/19/22 0037   07/18/22 1329  vancomycin (VANCOCIN) powder  Status:  Discontinued          As needed 07/18/22 1329 07/18/22 1505   07/18/22 1030  ceFAZolin (ANCEF) IVPB 2g/100 mL premix  Status:  Discontinued       Note to Pharmacy: OK PER DR. Roda Shutters   2 g 200 mL/hr over 30 Minutes Intravenous  Once 07/18/22 1019 07/18/22 1022   07/18/22 1030  ceFAZolin (ANCEF) IVPB 2g/100 mL premix  Status:  Discontinued       Note to Pharmacy: OK PER DR. Roda Shutters   2  g 200 mL/hr over 30 Minutes Intravenous  Once 07/18/22 1022 07/18/22 1022   07/18/22 1030  ceFAZolin (ANCEF) IVPB 2g/100 mL premix        2 g 200 mL/hr over 30 Minutes Intravenous  Once 07/18/22 1022 07/18/22 1239        Patient was given sequential compression devices, early ambulation, and chemoprophylaxis to prevent DVT.  Patient benefited maximally from hospital stay and there were no complications.    Recent vital signs: Patient Vitals for the past 24 hrs:  BP Temp Temp src Pulse Resp SpO2  07/21/22 0830 -- 97.9 F (36.6 C) Oral -- -- --  07/21/22 0535 (!) 139/92 98 F (36.7 C) Oral 73 19 100 %  07/20/22 2109 132/84 98 F (36.7 C) Oral 73 19 99 %  07/20/22 1809 136/79 98.3 F (36.8 C) Oral 87 18 98 %     Recent laboratory studies:  Recent Labs    07/20/22 1548 07/21/22 0449  WBC 14.9* 9.5  HGB 15.8* 14.7  HCT 45.5 43.5  PLT 217 189  NA 134* 133*  K 3.4* 4.0  CL 98 102  CO2 24 23  BUN 12 12  CREATININE 1.00 0.88  GLUCOSE 115* 114*  CALCIUM 9.3 8.5*     Discharge Medications:   Allergies as of 07/21/2022       Reactions   Cefdinir Other (See Comments)  Rectal bleeding   Erythromycin Nausea And Vomiting   Penicillins Hives   Sulfa Antibiotics Other (See Comments)   Rectal Bleeding    Clindamycin/lincomycin Nausea And Vomiting        Medication List     TAKE these medications    amLODipine 10 MG tablet Commonly known as: NORVASC Take 10 mg by mouth at bedtime.   aspirin EC 81 MG tablet Take 1 tablet (81 mg total) by mouth 2 (two) times daily. To be taken after surgery to prevent blood clots   docusate sodium 100 MG capsule Commonly known as: Colace Take 1 capsule (100 mg total) by mouth daily as needed.   fluticasone 50 MCG/ACT nasal spray Commonly known as: FLONASE Place 2 sprays into both nostrils daily.   levothyroxine 175 MCG tablet Commonly known as: SYNTHROID Take 175 mcg by mouth daily before breakfast.   loratadine 10 MG  tablet Commonly known as: CLARITIN Take 10 mg by mouth at bedtime.   methocarbamol 750 MG tablet Commonly known as: Robaxin-750 Take 1 tablet (750 mg total) by mouth 3 (three) times daily as needed for muscle spasms.   multivitamin tablet Take 1 tablet by mouth daily.   omeprazole 20 MG tablet Commonly known as: PRILOSEC OTC Take 20 mg by mouth daily as needed (acid reflux).   ondansetron 4 MG tablet Commonly known as: Zofran Take 1 tablet (4 mg total) by mouth every 8 (eight) hours as needed for nausea or vomiting.   oxyCODONE-acetaminophen 5-325 MG tablet Commonly known as: Percocet Take 1-2 tablets by mouth every 6 (six) hours as needed. To be taken after surgery   sertraline 50 MG tablet Commonly known as: ZOLOFT Take 50 mg by mouth at bedtime.               Durable Medical Equipment  (From admission, onward)           Start     Ordered   07/18/22 1609  DME Walker rolling  Once       Question Answer Comment  Walker: With 5 Inch Wheels   Patient needs a walker to treat with the following condition Status post left partial knee replacement      07/18/22 1608   07/18/22 1609  DME 3 n 1  Once        07/18/22 1608   07/18/22 1609  DME Bedside commode  Once       Question:  Patient needs a bedside commode to treat with the following condition  Answer:  Status post left partial knee replacement   07/18/22 1608            Diagnostic Studies: ECHOCARDIOGRAM COMPLETE  Result Date: 07/21/2022    ECHOCARDIOGRAM REPORT   Patient Name:   Anna Glover Date of Exam: 07/21/2022 Medical Rec #:  [id number]        Height:       72.0 in Accession #:    [id number]       Weight:       233.0 lb Date of Birth:  [birth year]        BSA:          2.273 m Patient Age:    47 years         BP:           139/92 mmHg Patient Gender: F                HR:  63 bpm. Exam Location:  Inpatient Procedure: 2D Echo, Cardiac Doppler and Color Doppler Indications:    Chest Pain   History:        Patient has no prior history of Echocardiogram examinations.  Sonographer:    Darlys Gales Referring Phys: [id number] Anna Glover IMPRESSIONS  1. Left ventricular ejection fraction, by estimation, is 60 to 65%. The left ventricle has normal function. The left ventricle has no regional wall motion abnormalities. Left ventricular diastolic parameters are consistent with Grade I diastolic dysfunction (impaired relaxation).  2. Right ventricular systolic function is normal. The right ventricular size is normal.  3. The mitral valve is abnormal. No evidence of mitral valve regurgitation. No evidence of mitral stenosis.  4. The aortic valve was not well visualized. Aortic valve regurgitation is not visualized. No aortic stenosis is present.  5. The inferior vena cava is normal in size with greater than 50% respiratory variability, suggesting right atrial pressure of 3 mmHg. FINDINGS  Left Ventricle: Left ventricular ejection fraction, by estimation, is 60 to 65%. The left ventricle has normal function. The left ventricle has no regional wall motion abnormalities. The left ventricular internal cavity size was normal in size. There is  no left ventricular hypertrophy. Left ventricular diastolic parameters are consistent with Grade I diastolic dysfunction (impaired relaxation). Right Ventricle: The right ventricular size is normal. No increase in right ventricular wall thickness. Right ventricular systolic function is normal. Left Atrium: Left atrial size was normal in size. Right Atrium: Right atrial size was normal in size. Pericardium: There is no evidence of pericardial effusion. Mitral Valve: The mitral valve is abnormal. There is mild thickening of the mitral valve leaflet(s). There is mild calcification of the mitral valve leaflet(s). No evidence of mitral valve regurgitation. No evidence of mitral valve stenosis. Tricuspid Valve: The tricuspid valve is normal in structure. Tricuspid valve  regurgitation is not demonstrated. No evidence of tricuspid stenosis. Aortic Valve: The aortic valve was not well visualized. Aortic valve regurgitation is not visualized. No aortic stenosis is present. Aortic valve mean gradient measures 4.0 mmHg. Aortic valve peak gradient measures 6.5 mmHg. Pulmonic Valve: The pulmonic valve was normal in structure. Pulmonic valve regurgitation is not visualized. No evidence of pulmonic stenosis. Aorta: The aortic root is normal in size and structure. Venous: The inferior vena cava is normal in size with greater than 50% respiratory variability, suggesting right atrial pressure of 3 mmHg. IAS/Shunts: The interatrial septum was not well visualized.  LEFT VENTRICLE PLAX 2D LVIDd:         5.10 cm Diastology LVIDs:         3.10 cm LV e' medial:    8.49 cm/s LV PW:         0.70 cm LV E/e' medial:  6.0 LV IVS:        1.00 cm LV e' lateral:   11.30 cm/s                        LV E/e' lateral: 4.5  RIGHT VENTRICLE             IVC RV S prime:     12.30 cm/s  IVC diam: 1.60 cm TAPSE (M-mode): 2.2 cm LEFT ATRIUM             Index        RIGHT ATRIUM          Index LA Vol (A2C):   48.4 ml 21.29 ml/m  RA Area:     8.56 cm LA Vol (A4C):   48.4 ml 21.29 ml/m  RA Volume:   12.60 ml 5.54 ml/m LA Biplane Vol: 49.6 ml 21.82 ml/m  AORTIC VALVE AV Vmax:           127.00 cm/s AV Vmean:          93.600 cm/s AV VTI:            0.256 m AV Peak Grad:      6.5 mmHg AV Mean Grad:      4.0 mmHg LVOT Vmax:         125.00 cm/s LVOT Vmean:        88.800 cm/s LVOT VTI:          0.261 m LVOT/AV VTI ratio: 1.02 MITRAL VALVE MV Area (PHT): 2.42 cm    SHUNTS MV Decel Time: 313 msec    Systemic VTI: 0.26 m MV E velocity: 50.80 cm/s MV A velocity: 88.60 cm/s MV E/A ratio:  0.57 Charlton Haws MD Electronically signed by Charlton Haws MD Signature Date/Time: 07/21/2022/10:28:36 AM    Final    DG CHEST PORT 1 VIEW  Result Date: 07/20/2022 CLINICAL DATA:  [id number] Chest pain [id number] EXAM: PORTABLE CHEST 1 VIEW  COMPARISON:  CXR 01/17/12 FINDINGS: The heart size and mediastinal contours are within normal limits. Both lungs are clear. The visualized skeletal structures are unremarkable. IMPRESSION: No active disease. Electronically Signed   By: Lorenza Cambridge M.D.   On: 07/20/2022 11:20   DG Knee Left Port  Result Date: 07/18/2022 CLINICAL DATA:  LEFT knee replacement. EXAM: PORTABLE LEFT KNEE - 1-2 VIEW COMPARISON:  None Available. FINDINGS: LEFT total knee replacement noted without definite complicating features. No evidence of acute fracture or dislocation. IMPRESSION: LEFT total knee replacement without definite complicating features. Electronically Signed   By: Harmon Pier M.D.   On: 07/18/2022 16:19    Disposition: Discharge disposition: 01-Home or Self Care          Follow-up Information     Courtney Paris, NP Follow up.   Specialty: Nurse Practitioner Contact information: [address] [place] [address] [phone]         [facility] Follow up.   Specialty: [facility] Contact information: [address] [place] [address] [phone]         Tarry Kos, MD. Schedule an appointment as soon as possible for a visit in 2 week(s).   Specialty: Orthopedic Surgery Contact information: [address] [place] [address] [phone]                  Signed: Cristie Hem 07/21/2022, 2:30 PM

## 2022-07-19 NOTE — Progress Notes (Signed)
Subjective: 1 Day Post-Op Procedure(s) (LRB): LEFT TOTAL KNEE REPLACEMENT (Left) Patient reports pain as moderate.    Objective: Vital signs in last 24 hours: Temp:  [97.6 F (36.4 C)-98.9 F (37.2 C)] 98.2 F (36.8 C) (06/04 0403) Pulse Rate:  [49-75] 70 (06/04 0403) Resp:  [12-20] 18 (06/04 0403) BP: (118-149)/(79-98) 143/86 (06/04 0403) SpO2:  [94 %-100 %] 95 % (06/04 0403) Weight:  [105.7 kg] 105.7 kg (06/03 1029)  Intake/Output from previous day: 06/03 0701 - 06/04 0700 In: 1094.1 [I.V.:994.1; IV Piggyback:100] Out: 2110 [Urine:2110] Intake/Output this shift: No intake/output data recorded.  Recent Labs    07/19/22 0208  HGB 15.3*   Recent Labs    07/19/22 0208  WBC 16.5*  RBC 4.67  HCT 43.4  PLT 226   No results for input(s): "NA", "K", "CL", "CO2", "BUN", "CREATININE", "GLUCOSE", "CALCIUM" in the last 72 hours. No results for input(s): "LABPT", "INR" in the last 72 hours.  Neurologically intact Neurovascular intact Sensation intact distally Intact pulses distally Dorsiflexion/Plantar flexion intact Incision: dressing C/D/I No cellulitis present Compartment soft   Assessment/Plan: 1 Day Post-Op Procedure(s) (LRB): LEFT TOTAL KNEE REPLACEMENT (Left) Advance diet Up with therapy D/C IV fluids Discharge home with home health once she has urinated AND has cleared PT WBAT LLE       Cristie Hem 07/19/2022, 8:02 AM

## 2022-07-20 ENCOUNTER — Observation Stay (HOSPITAL_COMMUNITY): Payer: BC Managed Care – PPO

## 2022-07-20 ENCOUNTER — Encounter (HOSPITAL_COMMUNITY): Payer: Self-pay | Admitting: Orthopaedic Surgery

## 2022-07-20 DIAGNOSIS — Z79899 Other long term (current) drug therapy: Secondary | ICD-10-CM | POA: Diagnosis not present

## 2022-07-20 DIAGNOSIS — D72829 Elevated white blood cell count, unspecified: Secondary | ICD-10-CM | POA: Diagnosis not present

## 2022-07-20 DIAGNOSIS — Z79890 Hormone replacement therapy (postmenopausal): Secondary | ICD-10-CM | POA: Diagnosis not present

## 2022-07-20 DIAGNOSIS — E039 Hypothyroidism, unspecified: Secondary | ICD-10-CM | POA: Diagnosis present

## 2022-07-20 DIAGNOSIS — Z880 Allergy status to penicillin: Secondary | ICD-10-CM | POA: Diagnosis not present

## 2022-07-20 DIAGNOSIS — Z881 Allergy status to other antibiotic agents status: Secondary | ICD-10-CM | POA: Diagnosis not present

## 2022-07-20 DIAGNOSIS — F418 Other specified anxiety disorders: Secondary | ICD-10-CM | POA: Diagnosis not present

## 2022-07-20 DIAGNOSIS — R519 Headache, unspecified: Secondary | ICD-10-CM | POA: Diagnosis not present

## 2022-07-20 DIAGNOSIS — F1020 Alcohol dependence, uncomplicated: Secondary | ICD-10-CM | POA: Diagnosis present

## 2022-07-20 DIAGNOSIS — R0789 Other chest pain: Secondary | ICD-10-CM | POA: Diagnosis not present

## 2022-07-20 DIAGNOSIS — M1712 Unilateral primary osteoarthritis, left knee: Secondary | ICD-10-CM | POA: Diagnosis present

## 2022-07-20 DIAGNOSIS — K2950 Unspecified chronic gastritis without bleeding: Secondary | ICD-10-CM | POA: Diagnosis present

## 2022-07-20 DIAGNOSIS — Z7982 Long term (current) use of aspirin: Secondary | ICD-10-CM | POA: Diagnosis not present

## 2022-07-20 DIAGNOSIS — Z9049 Acquired absence of other specified parts of digestive tract: Secondary | ICD-10-CM | POA: Diagnosis not present

## 2022-07-20 DIAGNOSIS — M2242 Chondromalacia patellae, left knee: Secondary | ICD-10-CM | POA: Diagnosis present

## 2022-07-20 DIAGNOSIS — I10 Essential (primary) hypertension: Secondary | ICD-10-CM

## 2022-07-20 DIAGNOSIS — F419 Anxiety disorder, unspecified: Secondary | ICD-10-CM | POA: Diagnosis present

## 2022-07-20 DIAGNOSIS — R55 Syncope and collapse: Secondary | ICD-10-CM | POA: Diagnosis not present

## 2022-07-20 DIAGNOSIS — F32A Depression, unspecified: Secondary | ICD-10-CM | POA: Diagnosis present

## 2022-07-20 DIAGNOSIS — M94262 Chondromalacia, left knee: Secondary | ICD-10-CM | POA: Diagnosis present

## 2022-07-20 DIAGNOSIS — Z8249 Family history of ischemic heart disease and other diseases of the circulatory system: Secondary | ICD-10-CM | POA: Diagnosis not present

## 2022-07-20 DIAGNOSIS — K760 Fatty (change of) liver, not elsewhere classified: Secondary | ICD-10-CM | POA: Diagnosis present

## 2022-07-20 DIAGNOSIS — Z96652 Presence of left artificial knee joint: Secondary | ICD-10-CM

## 2022-07-20 DIAGNOSIS — Z83438 Family history of other disorder of lipoprotein metabolism and other lipidemia: Secondary | ICD-10-CM | POA: Diagnosis not present

## 2022-07-20 DIAGNOSIS — R079 Chest pain, unspecified: Secondary | ICD-10-CM | POA: Diagnosis not present

## 2022-07-20 DIAGNOSIS — Z87891 Personal history of nicotine dependence: Secondary | ICD-10-CM | POA: Diagnosis not present

## 2022-07-20 DIAGNOSIS — K219 Gastro-esophageal reflux disease without esophagitis: Secondary | ICD-10-CM | POA: Diagnosis present

## 2022-07-20 LAB — COMPREHENSIVE METABOLIC PANEL
ALT: 268 U/L — ABNORMAL HIGH (ref 0–44)
AST: 203 U/L — ABNORMAL HIGH (ref 15–41)
Albumin: 3.7 g/dL (ref 3.5–5.0)
Alkaline Phosphatase: 125 U/L (ref 38–126)
Anion gap: 12 (ref 5–15)
BUN: 12 mg/dL (ref 6–20)
CO2: 24 mmol/L (ref 22–32)
Calcium: 9.3 mg/dL (ref 8.9–10.3)
Chloride: 98 mmol/L (ref 98–111)
Creatinine, Ser: 1 mg/dL (ref 0.44–1.00)
GFR, Estimated: >60 mL/min (ref >60)
Glucose, Bld: 115 mg/dL — ABNORMAL HIGH (ref 70–99)
Potassium: 3.4 mmol/L — ABNORMAL LOW (ref 3.5–5.1)
Sodium: 134 mmol/L — ABNORMAL LOW (ref 135–145)
Total Bilirubin: 1.1 mg/dL (ref 0.3–1.2)
Total Protein: 6.9 g/dL (ref 6.5–8.1)

## 2022-07-20 LAB — TROPONIN I (HIGH SENSITIVITY)
Troponin I (High Sensitivity): 9 ng/L (ref <18)
Troponin I (High Sensitivity): 14 ng/L (ref <18)

## 2022-07-20 LAB — CBC
HCT: 45.5 % (ref 36.0–46.0)
Hemoglobin: 15.8 g/dL — ABNORMAL HIGH (ref 12.0–15.0)
MCH: 33.2 pg (ref 26.0–34.0)
MCHC: 34.7 g/dL (ref 30.0–36.0)
MCV: 95.6 fL (ref 80.0–100.0)
Platelets: 217 10*3/uL (ref 150–400)
RBC: 4.76 MIL/uL (ref 3.87–5.11)
RDW: 11.9 % (ref 11.5–15.5)
WBC: 14.9 10*3/uL — ABNORMAL HIGH (ref 4.0–10.5)
nRBC: 0 % (ref 0.0–0.2)

## 2022-07-20 LAB — MAGNESIUM: Magnesium: 1.9 mg/dL (ref 1.7–2.4)

## 2022-07-20 LAB — PROCALCITONIN: Procalcitonin: 0.11 ng/mL

## 2022-07-20 MED ORDER — HYDRALAZINE HCL 20 MG/ML IJ SOLN: 10.0000 mg | Freq: Four times a day (QID) | INTRAMUSCULAR | Status: DC | PRN

## 2022-07-20 MED ORDER — PANTOPRAZOLE SODIUM 20 MG PO TBEC
20.0000 mg | DELAYED_RELEASE_TABLET | Freq: Every day | ORAL | Status: DC
  Administered 2022-07-20 – 2022-07-21 (×2): 20 mg via ORAL
  Filled 2022-07-20 (×2): qty 1

## 2022-07-20 MED ORDER — POLYETHYLENE GLYCOL 3350 17 G PO PACK
17.0000 g | PACK | Freq: Every day | ORAL | Status: DC | PRN
  Administered 2022-07-20 – 2022-07-21 (×2): 17 g via ORAL
  Filled 2022-07-20 (×2): qty 1

## 2022-07-20 MED ORDER — POTASSIUM CHLORIDE CRYS ER 20 MEQ PO TBCR
40.0000 meq | EXTENDED_RELEASE_TABLET | Freq: Once | ORAL | Status: AC
  Administered 2022-07-20: 40 meq via ORAL
  Filled 2022-07-20: qty 2

## 2022-07-20 MED ORDER — ADULT MULTIVITAMIN W/MINERALS CH
1.0000 | ORAL_TABLET | Freq: Every day | ORAL | Status: DC
  Administered 2022-07-20 – 2022-07-21 (×2): 1 via ORAL
  Filled 2022-07-20 (×2): qty 1

## 2022-07-20 MED ORDER — THIAMINE HCL 100 MG/ML IJ SOLN: 100.0000 mg | Freq: Every day | INTRAMUSCULAR | Status: DC

## 2022-07-20 MED ORDER — THIAMINE MONONITRATE 100 MG PO TABS
100.0000 mg | ORAL_TABLET | Freq: Every day | ORAL | Status: DC
  Administered 2022-07-20 – 2022-07-21 (×2): 100 mg via ORAL
  Filled 2022-07-20 (×2): qty 1

## 2022-07-20 MED ORDER — LORAZEPAM 1 MG PO TABS: 1.0000 mg | ORAL_TABLET | ORAL | Status: DC | PRN

## 2022-07-20 MED ORDER — SODIUM CHLORIDE 0.9 % IV SOLN: INTRAVENOUS | Status: DC

## 2022-07-20 MED ORDER — HYDRALAZINE HCL 20 MG/ML IJ SOLN
25.0000 mg | INTRAMUSCULAR | Status: DC | PRN
  Administered 2022-07-20: 25 mg via INTRAVENOUS
  Filled 2022-07-20: qty 2

## 2022-07-20 MED ORDER — FOLIC ACID 1 MG PO TABS
1.0000 mg | ORAL_TABLET | Freq: Every day | ORAL | Status: DC
  Administered 2022-07-20 – 2022-07-21 (×2): 1 mg via ORAL
  Filled 2022-07-20 (×2): qty 1

## 2022-07-20 MED ORDER — LORAZEPAM 2 MG/ML IJ SOLN: 1.0000 mg | INTRAMUSCULAR | Status: DC | PRN

## 2022-07-20 NOTE — Consult Note (Addendum)
Initial Consultation Note   Patient: Anna Glover WGN:[id number] DOB: December 22, 1975 PCP: Courtney Paris, NP DOA: 07/18/2022 DOS: the patient was seen and examined on 07/20/2022 Primary service: Tarry Kos, MD  Referring physician: Tarry Kos, MD Reason for consult: Chest Pain, Near Syncope  HPI and course: Anna Glover is a 47 y.o. female with medical history significant of alcohol use, fatty liver, IBS, hypothyroidism, hypertension, depression, anxiety, gastritis who presented for left total knee replacement by orthopedics on 6/3.  Patient presented for left total knee replacement on 6/30 as above, underwent successful procedure.  Patient has had 2 days of new symptoms.  She reports intermittent chills starting 2 days ago as well as near syncopal episodes where she gets lightheaded upon standing and sees spots in her vision but does not completely pass out.  Does report that she has been told her blood pressure has been higher when laying down versus standing but do not see formal orthostatic vital signs recorded.  She additionally has reported episode of chest pain today which occurred when she laid down after her PT session.  Reports that her blood pressure was elevated at that time and she received a IV medication for blood pressure and her symptoms subsided.  She does report significant alcohol use and notes that she has fatty liver disease.  However, she stopped drinking for about a week at the beginning of the year with no similar symptoms and no other withdrawal type symptoms.  Does also have history of anxiety.  She has history of heart disease with her father having his first MI in his 66s.  She is additionally reporting some fatigue feeling yesterday like she might be getting a fever but this has not recurred.  Had a temperature in the 99's but no true fever; has been receiving Tylenol.  Denies abdominal pain, constipation, diarrhea, nausea, vomiting.  - Vital signs include blood  pressure ranging from the 140s to 160s systolic per chart review.  Most recent labs were CBC yesterday with leukocytosis of 16.5 and hemoglobin 15.3.  Troponin today negative.  Chest x-ray without acute normality.  Review of Systems: As per HPI otherwise all other systems reviewed and are negative.  Past Medical History:  Diagnosis Date   Anxiety    Depression    Hypertension    Hypothyroidism    Osteoarthritis 2012   L knee    Past Surgical History:  Procedure Laterality Date   ADENOIDECTOMY  1991   BIOPSY  01/04/2021   Procedure: BIOPSY;  Surgeon: Tressia Danas, MD;  Location: [facility] ENDOSCOPY;  Service: Gastroenterology;;   CESAREAN SECTION  02/25/2003   CESAREAN SECTION  05/01/2009   CHOLECYSTECTOMY  07/15/2012   ESOPHAGOGASTRODUODENOSCOPY (EGD) WITH PROPOFOL N/A 01/04/2021   Procedure: ESOPHAGOGASTRODUODENOSCOPY (EGD) WITH PROPOFOL;  Surgeon: Tressia Danas, MD;  Location: [facility] ENDOSCOPY;  Service: Gastroenterology;  Laterality: N/A;    Social History  reports that she quit smoking about 13 years ago. Her smoking use included cigarettes. She has never used smokeless tobacco. She reports current alcohol use of about 1.0 standard drink of alcohol per week. She reports that she does not use drugs.  Allergies  Allergen Reactions   Cefdinir Other (See Comments)    Rectal bleeding   Erythromycin Nausea And Vomiting   Penicillins Hives   Sulfa Antibiotics Other (See Comments)    Rectal Bleeding    Clindamycin/Lincomycin Nausea And Vomiting    Family History  Problem Relation Age of Onset   Hypertension  Mother    Hyperlipidemia Mother    Hypertension Father    Heart disease Father    Pulmonary fibrosis Father    Heart attack Father 74   Migraines Neg Hx   Reviewed  Prior to Admission medications   Medication Sig Start Date End Date Taking? Authorizing Provider  amLODipine (NORVASC) 10 MG tablet Take 10 mg by mouth at bedtime.   Yes [provider]  aspirin  EC 81 MG tablet Take 1 tablet (81 mg total) by mouth 2 (two) times daily. To be taken after surgery to prevent blood clots 07/12/22 07/12/23  Cristie Hem, PA-C  docusate sodium (COLACE) 100 MG capsule Take 1 capsule (100 mg total) by mouth daily as needed. 07/12/22 07/12/23  Cristie Hem, PA-C  levothyroxine (SYNTHROID) 175 MCG tablet Take 175 mcg by mouth daily before breakfast.   Yes [provider]  loratadine (CLARITIN) 10 MG tablet Take 10 mg by mouth at bedtime.   Yes [provider]  methocarbamol (ROBAXIN-750) 750 MG tablet Take 1 tablet (750 mg total) by mouth 3 (three) times daily as needed for muscle spasms. 07/12/22   Cristie Hem, PA-C  Multiple Vitamin (MULTIVITAMIN) tablet Take 1 tablet by mouth daily.   Yes [provider]  omeprazole (PRILOSEC OTC) 20 MG tablet Take 20 mg by mouth daily as needed (acid reflux).   Yes [provider]  ondansetron (ZOFRAN) 4 MG tablet Take 1 tablet (4 mg total) by mouth every 8 (eight) hours as needed for nausea or vomiting. 07/12/22   Cristie Hem, PA-C  oxyCODONE-acetaminophen (PERCOCET) 5-325 MG tablet Take 1-2 tablets by mouth every 6 (six) hours as needed. To be taken after surgery 07/12/22   Cristie Hem, PA-C  sertraline (ZOLOFT) 50 MG tablet Take 50 mg by mouth at bedtime.   Yes [provider]  fluticasone (FLONASE) 50 MCG/ACT nasal spray Place 2 sprays into both nostrils daily.    [provider]    Physical Exam: Vitals:   07/19/22 2147 07/20/22 0557 07/20/22 0836 07/20/22 1120  BP: (!) 159/95 (!) 165/97 (!) 164/102 (!) 144/87  Pulse: 72 63 72 78  Resp: 19 19 19    Temp: 99.2 F (37.3 C) 98.2 F (36.8 C) 98.2 F (36.8 C)   TempSrc: Oral Oral Oral   SpO2: 100% 100% 99% 100%  Weight:      Height:        Physical Exam Constitutional:      General: She is not in acute distress.    Appearance: Normal appearance.  HENT:     Head: Normocephalic and atraumatic.      Mouth/Throat:     Mouth: Mucous membranes are moist.     Pharynx: Oropharynx is clear.  Eyes:     Extraocular Movements: Extraocular movements intact.     Pupils: Pupils are equal, round, and reactive to light.  Cardiovascular:     Rate and Rhythm: Normal rate and regular rhythm.     Pulses: Normal pulses.     Heart sounds: Murmur heard.  Pulmonary:     Effort: Pulmonary effort is normal. No respiratory distress.     Breath sounds: Normal breath sounds.  Abdominal:     General: Bowel sounds are normal. There is no distension.     Palpations: Abdomen is soft.     Tenderness: There is no abdominal tenderness.  Musculoskeletal:        General: No swelling or deformity.  Skin:  General: Skin is warm and dry.  Neurological:     General: No focal deficit present.     Mental Status: Mental status is at baseline.    Labs on Admission: I have personally reviewed following labs and imaging studies  CBC: Recent Labs  Lab 07/19/22 0208  WBC 16.5*  HGB 15.3*  HCT 43.4  MCV 92.9  PLT 226    Basic Metabolic Panel: No results for input(s): "NA", "K", "CL", "CO2", "GLUCOSE", "BUN", "CREATININE", "CALCIUM", "MG", "PHOS" in the last 168 hours.  GFR: Estimated Creatinine Clearance: 92.7 mL/min (A) (by C-G formula based on SCr of 1.02 mg/dL (H)).  Liver Function Tests: No results for input(s): "AST", "ALT", "ALKPHOS", "BILITOT", "PROT", "ALBUMIN" in the last 168 hours.  Urine analysis:    Component Value Date/Time   COLORURINE YELLOW 01/02/2021 1530   APPEARANCEUR HAZY (A) 01/02/2021 1530   LABSPEC 1.017 01/02/2021 1530   PHURINE 7.5 01/02/2021 1530   GLUCOSEU NEGATIVE 01/02/2021 1530   HGBUR NEGATIVE 01/02/2021 1530   BILIRUBINUR NEGATIVE 01/02/2021 1530   KETONESUR NEGATIVE 01/02/2021 1530   PROTEINUR TRACE (A) 01/02/2021 1530   UROBILINOGEN 2.0 (H) 06/03/2014 1633   NITRITE NEGATIVE 01/02/2021 1530   LEUKOCYTESUR NEGATIVE 01/02/2021 1530    Radiological Exams on  Admission: DG CHEST PORT 1 VIEW  Result Date: 07/20/2022 CLINICAL DATA:  [id number] Chest pain [id number] EXAM: PORTABLE CHEST 1 VIEW COMPARISON:  CXR 01/17/12 FINDINGS: The heart size and mediastinal contours are within normal limits. Both lungs are clear. The visualized skeletal structures are unremarkable. IMPRESSION: No active disease. Electronically Signed   By: Lorenza Cambridge M.D.   On: 07/20/2022 11:20   DG Knee Left Port  Result Date: 07/18/2022 CLINICAL DATA:  LEFT knee replacement. EXAM: PORTABLE LEFT KNEE - 1-2 VIEW COMPARISON:  None Available. FINDINGS: LEFT total knee replacement noted without definite complicating features. No evidence of acute fracture or dislocation. IMPRESSION: LEFT total knee replacement without definite complicating features. Electronically Signed   By: Harmon Pier M.D.   On: 07/18/2022 16:19    EKG: Independently reviewed.  Sinus rhythm at 76 bpm.  When compared to previous did have T wave inversion on 07/12/2022 and anterior leads.  This is now not present.  Assessment/Plan Principal Problem:   Primary osteoarthritis of left knee Active Problems:   Alcohol dependence syndrome (HCC)   Essential hypertension   Hypothyroidism (acquired)   Depression with anxiety   Status post total left knee replacement   Chest pain, rule out acute myocardial infarction   Near syncope   Left knee OA Status post left total knee replacement - Per primary team  Near syncope Chest pain > Patient has been experiencing intermittent lightheadedness especially on standing with possible but not recorded orthostatic vital signs. > Also reporting chest pain following a PT session but did not occur to her after she had laid down and seem to improved when she received a blood pressure medication. > Notably does have family history of heart disease in her father who had his first MI at 29.  Current EKG looks okay but EKG about a week ago showed some T wave inversions.  Chest pain is different  from her typical reflux/gastritis pain. > Troponin normal, will check delta.  If abnormality on repeat troponin, echo, or recurrent chest pain consider cardiology consult. - Continue to monitor - Will add on telemetry for now, change level of care to surgical telemetry - Echocardiogram - Check electrolytes - Check delta troponin  ADDENDUM > WBC remains elevated, but downtrending > Orthostatic vitals positive, sitting to standing with >25mmHg drop in systolic > Remains a febrile - Trend labs - Add on Procalcitonin, if positive, consider further infectious work-up - Increase IVF overnight - F/U echo when done  Hypothyroidism - Continue Synthroid  Hypertension - Continue home amlodipine  Depression Anxiety > Unclear how much anxiety could be contributing to her symptoms, seems less likely. - Continue home sertraline  Alcohol use > Last drink was about 4 days ago.  No history of withdrawal and did stop drinking for about a week at the beginning of the year. - Will keep on CIWA with Ativan, but doubt this is the etiology of her symptoms  Gastritis - Continue PPI  TRH will continue to follow the patient.  Synetta Fail MD [facility]   How to contact the [facility] Attending or Consulting provider 7A - 7P or covering provider during after hours 7P -7A, for this patient?   Check the care team in [facility] and look for a) attending/consulting [facility] provider listed and b) the [facility] team listed Log into [email or web address] and use [place]'s universal password to access. If you do not have the password, please contact the hospital operator. Locate the [facility] provider you are looking for under [facility] and page to a number that you can be directly reached. If you still have difficulty reaching the provider, please page the [facility] (Director on Call) for the Hospitalists listed on amion for assistance.  07/20/2022, 2:49 PM

## 2022-07-20 NOTE — Progress Notes (Signed)
Physical Therapy Treatment Patient Details Name: Anna Glover MRN: [id number] DOB: Mar 11, 1975 Today's Date: 07/20/2022   History of Present Illness 47 y.o. female presents to [facility] hospital on 07/18/2022 for L TKA. PMH includes anxiety, depression, HTN, hypothyroidism.    PT Comments    Pt received in supine and agreeable to session. Pt and RN reporting episode of chest pain earlier today, so stair trial was deferred this session. Pt requesting to use the bathroom and was able to ambulate to and from the bathroom with min guard for safety. Pt able to stand at the sink to perform hand hygiene without UE support. Pt reporting lightheadedness when returning to the recliner that improved once sitting with BLE elevated. Pt able to tolerate seated heel slides with min A for increased ROM. Education on performing exercises throughout the day and LLE positioning in extension. Pt continues to benefit from PT services to progress toward functional mobility goals.     Recommendations for follow up therapy are one component of a multi-disciplinary discharge planning process, led by the attending physician.  Recommendations may be updated based on patient status, additional functional criteria and insurance authorization.     Assistance Recommended at Discharge Intermittent Supervision/Assistance  Patient can return home with the following A little help with walking and/or transfers;A lot of help with bathing/dressing/bathroom;Assistance with cooking/housework;Assist for transportation;Help with stairs or ramp for entrance   Equipment Recommendations  BSC/3in1    Recommendations for Other Services       Precautions / Restrictions Precautions Precautions: Fall;Knee Precaution Booklet Issued: Yes (comment) Precaution Comments: watch BP Restrictions Weight Bearing Restrictions: No LLE Weight Bearing: Weight bearing as tolerated     Mobility  Bed Mobility Overal bed mobility: Modified Independent                   Transfers Overall transfer level: Needs assistance Equipment used: Rolling walker (2 wheels) Transfers: Sit to/from Stand Sit to Stand: Supervision           General transfer comment: from EOB and toilet with supervision for safety.Pt with initial difficulty with power up from EOB, however was able to readjust hand placement and perform without assist.    Ambulation/Gait Ambulation/Gait assistance: Supervision Gait Distance (Feet): 10 Feet (x2) Assistive device: Rolling walker (2 wheels) Gait Pattern/deviations: Step-through pattern, Antalgic, Decreased stance time - left       General Gait Details: Slow, step-through pattern with heavy reliance on BUEs on RW support. Cues not to step past the front bar of RW.     Balance Overall balance assessment: Needs assistance Sitting-balance support: No upper extremity supported, Feet supported Sitting balance-Leahy Scale: Good Sitting balance - Comments: sitting EOB   Standing balance support: During functional activity, Bilateral upper extremity supported, Reliant on assistive device for balance Standing balance-Leahy Scale: Fair Standing balance comment: with RW support. Able to stand without UE support at sink for hand hygiene                            Cognition Arousal/Alertness: Awake/alert Behavior During Therapy: WFL for tasks assessed/performed Overall Cognitive Status: Within Functional Limits for tasks assessed                                          Exercises Total Joint Exercises Heel Slides: AROM, Supine, Left, 5 reps,  AAROM    General Comments General comments (skin integrity, edema, etc.): Pt reporting dizziness when walking from the bathroom to the recliner and seeing "black and white dots". BP 117/77 once sitting in the recliner      Pertinent Vitals/Pain Pain Assessment Pain Assessment: Faces Pain Score: 3  Faces Pain Scale: Hurts even more Pain  Location: L knee Pain Descriptors / Indicators: Aching, Sharp, Guarding, Grimacing Pain Intervention(s): Monitored during session, Repositioned     PT Goals (current goals can now be found in the care plan section) Acute Rehab PT Goals Patient Stated Goal: to return to independence PT Goal Formulation: With patient Time For Goal Achievement: 07/22/22 Potential to Achieve Goals: Good Progress towards PT goals: Progressing toward goals    Frequency    7X/week      PT Plan Current plan remains appropriate       AM-PAC PT "6 Clicks" Mobility   Outcome Measure  Help needed turning from your back to your side while in a flat bed without using bedrails?: None Help needed moving from lying on your back to sitting on the side of a flat bed without using bedrails?: None Help needed moving to and from a bed to a chair (including a wheelchair)?: A Little Help needed standing up from a chair using your arms (e.g., wheelchair or bedside chair)?: A Little Help needed to walk in hospital room?: A Little Help needed climbing 3-5 steps with a railing? : A Little 6 Click Score: 20    End of Session Equipment Utilized During Treatment: Gait belt Activity Tolerance: Patient tolerated treatment well Patient left: with call bell/phone within reach;in chair Nurse Communication: Mobility status PT Visit Diagnosis: Other abnormalities of gait and mobility (R26.89);Muscle weakness (generalized) (M62.81);Pain Pain - Right/Left: Left Pain - part of body: Knee     Time: 1610-9604 PT Time Calculation (min) (ACUTE ONLY): 28 min  Charges:  $Gait Training: 8-22 mins $Therapeutic Exercise: 8-22 mins                     Johny Shock, PTA Acute Rehabilitation Services Secure Chat Preferred  [phone]    Johny Shock 07/20/2022, 1:39 PM

## 2022-07-20 NOTE — Progress Notes (Signed)
Physical Therapy Treatment Patient Details Name: Anna Glover MRN: [id number] DOB: 05-06-1975 Today's Date: 07/20/2022   History of Present Illness 47 y.o. female presents to [facility] hospital on 07/18/2022 for L TKA. PMH includes anxiety, depression, HTN, hypothyroidism.    PT Comments    Pt received in supine and agreeable to session. Pt reporting decreased pain due to recent medicine. Pt making good progress towards mobility goals. Pt demonstrating good balance on RLE to offload LLE during hand hygiene without UE support and standing trials. Pt able to increase gait distance with one seated rest break. Pt able to perform stair trial with min guard for safety, however pt reporting dizziness that improved with a seated rest break. Pt would benefit from additional stair trial before discharge due to dizziness. Pt continues to benefit from PT services to progress toward functional mobility goals.     Recommendations for follow up therapy are one component of a multi-disciplinary discharge planning process, led by the attending physician.  Recommendations may be updated based on patient status, additional functional criteria and insurance authorization.     Assistance Recommended at Discharge Intermittent Supervision/Assistance  Patient can return home with the following A little help with walking and/or transfers;A lot of help with bathing/dressing/bathroom;Assistance with cooking/housework;Assist for transportation;Help with stairs or ramp for entrance   Equipment Recommendations  BSC/3in1    Recommendations for Other Services       Precautions / Restrictions Precautions Precautions: Fall;Knee Precaution Booklet Issued: Yes (comment) Precaution Comments: orthostatic Restrictions Weight Bearing Restrictions: Yes LLE Weight Bearing: Weight bearing as tolerated     Mobility  Bed Mobility Overal bed mobility: Modified Independent                  Transfers Overall transfer  level: Needs assistance Equipment used: Rolling walker (2 wheels) Transfers: Sit to/from Stand Sit to Stand: Supervision           General transfer comment: from EOB, mat table, and toilet with supervision for safety.    Ambulation/Gait Ambulation/Gait assistance: Min guard Gait Distance (Feet): 75 Feet (x2) Assistive device: Rolling walker (2 wheels) Gait Pattern/deviations: Step-through pattern, Antalgic, Decreased stance time - left       General Gait Details: Pt demonstrating slow, steady step-through pattern with heavy reliance on BUEs to offload LLE. Cues for heel strike.   Stairs Stairs: Yes Stairs assistance: Min guard Stair Management: One rail Left Number of Stairs: 2 General stair comments: Cues for sequencing and technique      Balance Overall balance assessment: Needs assistance Sitting-balance support: No upper extremity supported, Feet supported Sitting balance-Leahy Scale: Good Sitting balance - Comments: sitting EOB   Standing balance support: During functional activity, Bilateral upper extremity supported, Reliant on assistive device for balance Standing balance-Leahy Scale: Fair Standing balance comment: with RW support. Able to stand without UE support at sink for hand hygiene                            Cognition Arousal/Alertness: Awake/alert Behavior During Therapy: WFL for tasks assessed/performed Overall Cognitive Status: Within Functional Limits for tasks assessed                                          Exercises      General Comments General comments (skin integrity, edema, etc.): Pt reporting dizziness during  stair trial that improved with seated rest break.      Pertinent Vitals/Pain Pain Assessment Pain Assessment: 0-10 Pain Score: 3  Pain Location: L knee Pain Descriptors / Indicators: Aching, Sharp Pain Intervention(s): Monitored during session, Repositioned     PT Goals (current goals can  now be found in the care plan section) Acute Rehab PT Goals Patient Stated Goal: to return to independence PT Goal Formulation: With patient Time For Goal Achievement: 07/22/22 Potential to Achieve Goals: Good Progress towards PT goals: Progressing toward goals    Frequency    7X/week      PT Plan Current plan remains appropriate       AM-PAC PT "6 Clicks" Mobility   Outcome Measure  Help needed turning from your back to your side while in a flat bed without using bedrails?: None Help needed moving from lying on your back to sitting on the side of a flat bed without using bedrails?: None Help needed moving to and from a bed to a chair (including a wheelchair)?: A Little Help needed standing up from a chair using your arms (e.g., wheelchair or bedside chair)?: A Little Help needed to walk in hospital room?: A Little Help needed climbing 3-5 steps with a railing? : A Little 6 Click Score: 20    End of Session Equipment Utilized During Treatment: Gait belt Activity Tolerance: Patient tolerated treatment well Patient left: in bed;with call bell/phone within reach Nurse Communication: Mobility status PT Visit Diagnosis: Other abnormalities of gait and mobility (R26.89);Muscle weakness (generalized) (M62.81);Pain Pain - Right/Left: Left Pain - part of body: Knee     Time: 7829-5621 PT Time Calculation (min) (ACUTE ONLY): 25 min  Charges:  $Gait Training: 23-37 mins                     Johny Shock, PTA Acute Rehabilitation Services Secure Chat Preferred  [phone]    Johny Shock 07/20/2022, 9:05 AM

## 2022-07-20 NOTE — Progress Notes (Signed)
Subjective: 2 Days Post-Op Procedure(s) (LRB): LEFT TOTAL KNEE REPLACEMENT (Left) Patient reports pain as mild.    Objective: Vital signs in last 24 hours: Temp:  [98.2 F (36.8 C)-99.2 F (37.3 C)] 98.2 F (36.8 C) (06/05 0557) Pulse Rate:  [63-84] 63 (06/05 0557) Resp:  [15-19] 19 (06/05 0557) BP: (150-166)/(93-100) 165/97 (06/05 0557) SpO2:  [95 %-100 %] 100 % (06/05 0557)  Intake/Output from previous day: 06/04 0701 - 06/05 0700 In: 375 [I.V.:375] Out: -  Intake/Output this shift: No intake/output data recorded.  Recent Labs    07/19/22 0208  HGB 15.3*   Recent Labs    07/19/22 0208  WBC 16.5*  RBC 4.67  HCT 43.4  PLT 226   No results for input(s): "NA", "K", "CL", "CO2", "BUN", "CREATININE", "GLUCOSE", "CALCIUM" in the last 72 hours. No results for input(s): "LABPT", "INR" in the last 72 hours.  Neurologically intact Neurovascular intact Sensation intact distally Intact pulses distally Dorsiflexion/Plantar flexion intact Incision: dressing C/D/I No cellulitis present Compartment soft   Assessment/Plan: 2 Days Post-Op Procedure(s) (LRB): LEFT TOTAL KNEE REPLACEMENT (Left) Advance diet Up with therapy Discharge home with home health once BP under control, has cleared PT and has not have anymore pre-syncopal episodes WBAT LLE Hypertension- on Norvasc qhs at home.  Will continue to monitor while inpatient      Cristie Hem 07/20/2022, 7:48 AM

## 2022-07-21 ENCOUNTER — Inpatient Hospital Stay (HOSPITAL_COMMUNITY): Payer: BC Managed Care – PPO

## 2022-07-21 DIAGNOSIS — R079 Chest pain, unspecified: Secondary | ICD-10-CM

## 2022-07-21 DIAGNOSIS — M1712 Unilateral primary osteoarthritis, left knee: Secondary | ICD-10-CM | POA: Diagnosis not present

## 2022-07-21 LAB — BASIC METABOLIC PANEL
Anion gap: 8 (ref 5–15)
BUN: 12 mg/dL (ref 6–20)
CO2: 23 mmol/L (ref 22–32)
Calcium: 8.5 mg/dL — ABNORMAL LOW (ref 8.9–10.3)
Chloride: 102 mmol/L (ref 98–111)
Creatinine, Ser: 0.88 mg/dL (ref 0.44–1.00)
GFR, Estimated: >60 mL/min (ref >60)
Glucose, Bld: 114 mg/dL — ABNORMAL HIGH (ref 70–99)
Potassium: 4 mmol/L (ref 3.5–5.1)
Sodium: 133 mmol/L — ABNORMAL LOW (ref 135–145)

## 2022-07-21 LAB — CBC
HCT: 43.5 % (ref 36.0–46.0)
Hemoglobin: 14.7 g/dL (ref 12.0–15.0)
MCH: 33.1 pg (ref 26.0–34.0)
MCHC: 33.8 g/dL (ref 30.0–36.0)
MCV: 98 fL (ref 80.0–100.0)
Platelets: 189 10*3/uL (ref 150–400)
RBC: 4.44 MIL/uL (ref 3.87–5.11)
RDW: 12 % (ref 11.5–15.5)
WBC: 9.5 10*3/uL (ref 4.0–10.5)
nRBC: 0 % (ref 0.0–0.2)

## 2022-07-21 LAB — ECHOCARDIOGRAM COMPLETE
AV Mean grad: 4 mmHg
AV Peak grad: 6.5 mmHg
Ao pk vel: 1.27 m/s
Area-P 1/2: 2.42 cm2
Height: 72 in
S' Lateral: 3.1 cm
Weight: 3728 oz

## 2022-07-21 MED ORDER — ALPRAZOLAM 0.25 MG PO TABS: 0.2500 mg | ORAL_TABLET | Freq: Three times a day (TID) | ORAL | Status: DC | PRN

## 2022-07-21 MED ORDER — BUTALBITAL-APAP-CAFFEINE 50-325-40 MG PO TABS: 1.0000 | ORAL_TABLET | Freq: Four times a day (QID) | ORAL | Status: DC | PRN

## 2022-07-21 NOTE — TOC Transition Note (Signed)
Transition of Care [facility]) - CM/SW Discharge Note   Patient Details  Name: Anna Glover MRN: [id number] Date of Birth: [birth year]  [facility] CM/SW Contact:  Epifanio Lesches, RN Phone Number: 07/21/2022, 2:42 PM   Clinical Narrative:    Patient will DC to: home Anticipated DC date: 07/21/2022 Family notified: yes, husband  Transport by: car          - s/p L TKA , 07/18/18 Per MD patient ready for DC today. RN, patient, patient's family, and [facility]. notified of DC plan.   Pt without  RX med concern. Post hospital f/u noted on AVS. Husband to provide transportation to home.  RNCM will sign off for now as intervention is no longer needed. Please consult [place] again if new needs arise.   Final next level of care: Home w Home Health Services Barriers to Discharge: No Barriers Identified   Patient Goals and CMS Choice CMS Medicare.gov Compare Post Acute Care list provided to:: Patient Choice offered to / list presented to : Patient  Discharge Placement                         Discharge Plan and Services Additional resources added to the After Visit Summary for                  DME Arranged: 3-N-1 DME Agency: [employer] Date DME Agency Contacted: 07/19/22 Time DME Agency Contacted: [phone] Representative spoke with at DME Agency: Vaughan Basta HH Arranged: PT, OT HH Agency: [facility] Date [facility] Agency Contacted: 07/19/22 Time HH Agency Contacted: [phone] Representative spoke with at [facility] Agency: Tresa Endo  Social Determinants of Health (SDOH) Interventions SDOH Screenings   Food Insecurity: No Food Insecurity (07/18/2022)  Housing: Low Risk  (07/18/2022)  Transportation Needs: No Transportation Needs (07/18/2022)  Utilities: Not At Risk (07/18/2022)  Tobacco Use: Medium Risk (07/20/2022)     Readmission Risk Interventions     No data to display

## 2022-07-21 NOTE — Consult Note (Signed)
Anna Glover  ZOX:[id number] DOB: April 05, 1975 DOA: 07/18/2022 PCP: Courtney Paris, NP    Brief Narrative:  47 year old with a history of fatty liver, habitual alcohol consumption, IBS, hypothyroidism, HTN, depression/anxiety, and gastritis who was admitted 07/18/2022 for an elective left total knee replacement.  Her surgery was completed without complication but postoperatively she has reported intermittent chest pains and some presyncopal symptoms.  Goals of Care:  Code Status: Full Code   DVT prophylaxis: ASA per Ortho  Interim Hx: Patient complains of a generalized headache this morning, which resolved after oxycodone.  She is afebrile.  Vital signs are stable.  Oxygen saturation 97% on room air. She has had no more chest pain.   The patient is cleared for discharge from a medical standpoint.   Assessment & Plan:  Left knee osteoarthritis status post left TKR 07/18/2022 Per Orthopedic Surgery  Near syncope -orthostatic symptoms Patient was found to be experiencing a greater than 20 mmHg drop in systolic blood pressure with positional changes -she has been hydrated and her sx have resolved   Single episode of atypical chest pain She has no personal history of coronary disease, is not a smoker, and is not diabetic -she is hypertensive and reportedly her father had an MI in his early 23s -troponin trend benign - twelve-lead EKG unrevealing - TTE notes preserved ED w/ no WMA and only grade 1 DD (in setting of known HTN) - no further inpatient w/u in indicated   Hypothyroidism Continue usual home Synthroid dose  HTN Continue usual home amlodipine dose  Depression/anxiety Continue usual home sertraline dose  Chronic gastritis Continue usual PPI dose  Family Communication: spoke w/ spouse at bedside  Disposition:  medically cleared for d/c home at discretion of Orthopedics    Objective: Blood pressure (!) 139/92, pulse 73, temperature 97.9 F (36.6 C), temperature source Oral,  resp. rate 19, height 6' (1.829 m), weight 105.7 kg, last menstrual period 07/09/2022, SpO2 100 %.  Intake/Output Summary (Last 24 hours) at 07/21/2022 1015 Last data filed at 07/21/2022 0800 Gross per 24 hour  Intake 410 ml  Output --  Net 410 ml   Filed Weights   07/18/22 1029  Weight: 105.7 kg    Examination: General: No acute respiratory distress Lungs: Clear to auscultation bilaterally without wheezes or crackles Cardiovascular: Regular rate and rhythm without murmur gallop or rub normal S1 and S2 Abdomen: Nontender, nondistended, soft, bowel sounds positive, no rebound, no ascites, no appreciable mass Extremities: No significant cyanosis, clubbing, or edema bilateral lower extremities  CBC: Recent Labs  Lab 07/19/22 0208 07/20/22 1548 07/21/22 0449  WBC 16.5* 14.9* 9.5  HGB 15.3* 15.8* 14.7  HCT 43.4 45.5 43.5  MCV 92.9 95.6 98.0  PLT 226 217 189   Basic Metabolic Panel: Recent Labs  Lab 07/20/22 1548 07/21/22 0449  NA 134* 133*  K 3.4* 4.0  CL 98 102  CO2 24 23  GLUCOSE 115* 114*  BUN 12 12  CREATININE 1.00 0.88  CALCIUM 9.3 8.5*  MG 1.9  --    GFR: Estimated Creatinine Clearance: 107.4 mL/min (by C-G formula based on SCr of 0.88 mg/dL).   Scheduled Meds:  amLODipine  10 mg Oral QHS   aspirin  81 mg Oral BID   docusate sodium  100 mg Oral BID   ferrous sulfate  325 mg Oral TID PC   folic acid  1 mg Oral Daily   levothyroxine  175 mcg Oral QAC breakfast   multivitamin with  minerals  1 tablet Oral Daily   oxyCODONE  10 mg Oral Q12H   pantoprazole  20 mg Oral Daily   sertraline  50 mg Oral QHS   thiamine  100 mg Oral Daily   Continuous Infusions:  methocarbamol (ROBAXIN) IV       LOS: 1 day   Lonia Blood, MD [facility] Office  [phone] Pager - Text Page per Loretha Stapler  If 7PM-7AM, please contact night-coverage per Amion 07/21/2022, 10:15 AM

## 2022-07-21 NOTE — Progress Notes (Signed)
Subjective: 3 Days Post-Op Procedure(s) (LRB): LEFT TOTAL KNEE REPLACEMENT (Left) Patient reports pain as mild.  Only complaint right now is a relatively bad headache.  Patient states it feels similar to that of the ha she experienced in 2016 with rocky [place] spotted fever.  Knee is feeling well today.    Objective: Vital signs in last 24 hours: Temp:  [98 F (36.7 C)-98.3 F (36.8 C)] 98 F (36.7 C) (06/06 0535) Pulse Rate:  [72-87] 73 (06/06 0535) Resp:  [18-19] 19 (06/06 0535) BP: (132-164)/(79-102) 139/92 (06/06 0535) SpO2:  [98 %-100 %] 100 % (06/06 0535)  Intake/Output from previous day: 06/05 0701 - 06/06 0700 In: 360 [P.O.:360] Out: -  Intake/Output this shift: No intake/output data recorded.  Recent Labs    07/19/22 0208 07/20/22 1548 07/21/22 0449  HGB 15.3* 15.8* 14.7   Recent Labs    07/20/22 1548 07/21/22 0449  WBC 14.9* 9.5  RBC 4.76 4.44  HCT 45.5 43.5  PLT 217 189   Recent Labs    07/20/22 1548 07/21/22 0449  NA 134* 133*  K 3.4* 4.0  CL 98 102  CO2 24 23  BUN 12 12  CREATININE 1.00 0.88  GLUCOSE 115* 114*  CALCIUM 9.3 8.5*   No results for input(s): "LABPT", "INR" in the last 72 hours.  Neurologically intact Neurovascular intact Sensation intact distally Intact pulses distally Dorsiflexion/Plantar flexion intact Incision: dressing C/D/I No cellulitis present Compartment soft   Assessment/Plan: 3 Days Post-Op Procedure(s) (LRB): LEFT TOTAL KNEE REPLACEMENT (Left) Up with therapy WBAT LLE Hospitalist following- labs appear to be relatively unremarkable thus far.  BP seems to have improved, but still elevated.  Will continue plan per medicine service D/c home with hhpt once cleared by medicine      Cristie Hem 07/21/2022, 7:52 AM

## 2022-07-21 NOTE — Progress Notes (Signed)
Physical Therapy Treatment Patient Details Name: Anna Glover MRN: [id number] DOB: [birth year] Today's Date: 07/21/2022   History of Present Illness 47 y.o. female presents to [place] [facility] on 07/18/2022 for L TKA. PMH includes anxiety, depression, HTN, hypothyroidism.    PT Comments    Pt received sitting in the recliner and agreeable to session. Pt making good progress towards functional mobility goals and reporting no adverse symptoms throughout session. Pt able to perform gait trial with focus on improved heel strike, LLE WB, and RW proximity. Pt able to perform stair trial with cues for LLE quad activation for increased knee stability with pt able to demonstrate and reporting improved pain. Pt performing seated L knee flexion for increased ROM. Anticipate pt and family will be able to manage pt's mobility needs at home when medically ready for discharge.    Recommendations for follow up therapy are one component of a multi-disciplinary discharge planning process, led by the attending physician.  Recommendations may be updated based on patient status, additional functional criteria and insurance authorization.     Assistance Recommended at Discharge Intermittent Supervision/Assistance  Patient can return home with the following A little help with walking and/or transfers;A lot of help with bathing/dressing/bathroom;Assistance with cooking/housework;Assist for transportation;Help with stairs or ramp for entrance   Equipment Recommendations  BSC/3in1    Recommendations for Other Services       Precautions / Restrictions Precautions Precautions: Fall;Knee Precaution Booklet Issued: Yes (comment) Restrictions Weight Bearing Restrictions: No LLE Weight Bearing: Weight bearing as tolerated     Mobility  Bed Mobility               General bed mobility comments: Pt beginning and ending session in recliner    Transfers Overall transfer level: Needs assistance Equipment used:  Rolling walker (2 wheels) Transfers: Sit to/from Stand Sit to Stand: Supervision           General transfer comment: From recliner with supervision for safety    Ambulation/Gait Ambulation/Gait assistance: Supervision Gait Distance (Feet): 150 Feet Assistive device: Rolling walker (2 wheels) Gait Pattern/deviations: Step-through pattern, Antalgic, Decreased stance time - left       General Gait Details: Pt demonstrating a step-through pattern with improved LLE WB with cues. Cues to not step past the front bar of RW.   Stairs Stairs: Yes Stairs assistance: Min guard Stair Management: One rail Left, Sideways Number of Stairs: 4 General stair comments: Cues for sequencing and technique. Pt demonstrating improved L knee stability with cues.      Balance Overall balance assessment: Needs assistance Sitting-balance support: No upper extremity supported, Feet supported Sitting balance-Leahy Scale: Good Sitting balance - Comments: sitting EOB   Standing balance support: During functional activity, Bilateral upper extremity supported, Reliant on assistive device for balance Standing balance-Leahy Scale: Fair Standing balance comment: with RW support                            Cognition Arousal/Alertness: Awake/alert Behavior During Therapy: WFL for tasks assessed/performed Overall Cognitive Status: Within Functional Limits for tasks assessed                                          Exercises Total Joint Exercises Knee Flexion: AROM, Seated, Left, 5 reps    General Comments General comments (skin integrity, edema, etc.): Pt reporting no  adverse symptoms throughout session      Pertinent Vitals/Pain Pain Assessment Pain Assessment: Faces Faces Pain Scale: Hurts little more Pain Location: L knee with flexion Pain Descriptors / Indicators: Aching, Sharp, Guarding, Grimacing Pain Intervention(s): Monitored during session, Repositioned      PT Goals (current goals can now be found in the care plan section) Acute Rehab PT Goals Patient Stated Goal: to return to independence PT Goal Formulation: With patient Time For Goal Achievement: 07/22/22 Potential to Achieve Goals: Good Progress towards PT goals: Progressing toward goals    Frequency    7X/week      PT Plan Current plan remains appropriate       AM-PAC PT "6 Clicks" Mobility   Outcome Measure  Help needed turning from your back to your side while in a flat bed without using bedrails?: None Help needed moving from lying on your back to sitting on the side of a flat bed without using bedrails?: None Help needed moving to and from a bed to a chair (including a wheelchair)?: A Little Help needed standing up from a chair using your arms (e.g., wheelchair or bedside chair)?: A Little Help needed to walk in hospital room?: A Little Help needed climbing 3-5 steps with a railing? : A Little 6 Click Score: 20    End of Session Equipment Utilized During Treatment: Gait belt Activity Tolerance: Patient tolerated treatment well Patient left: with call bell/phone within reach;in chair;with family/visitor present Nurse Communication: Mobility status PT Visit Diagnosis: Other abnormalities of gait and mobility (R26.89);Muscle weakness (generalized) (M62.81);Pain Pain - Right/Left: Left Pain - part of body: Knee     Time: 3086-5784 PT Time Calculation (min) (ACUTE ONLY): 19 min  Charges:  $Gait Training: 8-22 mins                     Johny Shock, PTA Acute Rehabilitation Services Secure Chat Preferred  [phone]    Johny Shock 07/21/2022, 12:24 PM

## 2022-07-22 ENCOUNTER — Encounter (HOSPITAL_COMMUNITY): Payer: Self-pay | Admitting: Orthopaedic Surgery

## 2022-08-02 ENCOUNTER — Ambulatory Visit (INDEPENDENT_AMBULATORY_CARE_PROVIDER_SITE_OTHER): Payer: BC Managed Care – PPO | Admitting: Physician Assistant

## 2022-08-02 DIAGNOSIS — Z96652 Presence of left artificial knee joint: Secondary | ICD-10-CM

## 2022-08-02 MED ORDER — HYDROCODONE-ACETAMINOPHEN 5-325 MG PO TABS: 1.0000 | ORAL_TABLET | Freq: Three times a day (TID) | ORAL | 0 refills | Status: AC | PRN

## 2022-08-02 NOTE — Progress Notes (Signed)
Post-Op Visit Note   Patient: Anna Glover           Date of Birth: May 12, 1975           MRN: [id number] Visit Date: 08/02/2022 PCP: Courtney Paris, NP   Assessment & Plan:  Chief Complaint:  Chief Complaint  Patient presents with   Left Knee - Routine Post Op   Visit Diagnoses:  1. S/P total knee arthroplasty, left     Plan: Patient is a pleasant 47 year old female who comes in today 2 weeks status post left total knee replacement 07/18/2022.  She has been doing well.  She is taking Tylenol and Robaxin during the day and at night but is unable to fall asleep and stay asleep due to pain.  She has been getting home health physical therapy and is ambulating unassisted.  She has been compliant taking baby aspirin twice daily for DVT prophylaxis.  Examination of her left knee reveals a fully healed surgical scar without complication.  Calves are soft and nontender.  She is neurovascularly intact distally.  Today, wound was cleaned and recovered with new Steri-Strips.  I sent in a referral for outpatient physical therapy and I sent in a prescription of Norco.  She will continue taking baby aspirin twice daily for DVT prophylaxis.  She will follow-up with [place] in 4 weeks for repeat evaluation and 2 view x-rays of the left knee.  Call with concerns or questions.  Follow-Up Instructions: Return in about 4 weeks (around 08/30/2022).   Orders:  No orders of the defined types were placed in this encounter.  No orders of the defined types were placed in this encounter.   Imaging: No results found.  PMFS History: Patient Active Problem List   Diagnosis Date Noted   Chest pain, rule out acute myocardial infarction 07/20/2022   Near syncope 07/20/2022   S/P total knee arthroplasty, left 07/20/2022   Status post total left knee replacement 07/18/2022   Gastritis and gastroduodenitis    Acute esophagitis    RUQ pain    Alcohol dependence syndrome (HCC) 01/03/2021   Essential hypertension  01/03/2021   Hypothyroidism (acquired) 01/03/2021   Depression with anxiety 01/03/2021   Hematemesis 01/02/2021   Acute medial meniscus tear of left knee 06/30/2020   Primary osteoarthritis of right knee 03/31/2020   Primary osteoarthritis of left knee 03/31/2020   Headache 06/03/2014   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   Thrombocytopenia (HCC) 06/03/2014   Elevated LFTs 06/03/2014   Past Medical History:  Diagnosis Date   Anxiety    Depression    Hypertension    Hypothyroidism    Osteoarthritis 2012   L knee    Family History  Problem Relation Age of Onset   Hypertension Mother    Hyperlipidemia Mother    Hypertension Father    Heart disease Father    Pulmonary fibrosis Father    Heart attack Father 72   Migraines Neg Hx     Past Surgical History:  Procedure Laterality Date   ADENOIDECTOMY  1991   BIOPSY  01/04/2021   Procedure: BIOPSY;  Surgeon: Tressia Danas, MD;  Location: [facility] ENDOSCOPY;  Service: Gastroenterology;;   CESAREAN SECTION  02/25/2003   CESAREAN SECTION  05/01/2009   CHOLECYSTECTOMY  07/15/2012   ESOPHAGOGASTRODUODENOSCOPY (EGD) WITH PROPOFOL N/A 01/04/2021   Procedure: ESOPHAGOGASTRODUODENOSCOPY (EGD) WITH PROPOFOL;  Surgeon: Tressia Danas, MD;  Location: [facility] ENDOSCOPY;  Service: Gastroenterology;  Laterality: N/A;   TOTAL KNEE ARTHROPLASTY Left 07/18/2022  Procedure: LEFT TOTAL KNEE REPLACEMENT;  Surgeon: Tarry Kos, MD;  Location: [facility] OR;  Service: Orthopedics;  Laterality: Left;   Social History   Occupational History   Occupation: Midwife  Tobacco Use   Smoking status: Former    Years: 10    Types: Cigarettes    Quit date: 07/10/2009    Years since quitting: 13.0   Smokeless tobacco: Never  Vaping Use   Vaping Use: Never used  Substance and Sexual Activity   Alcohol use: Yes    Alcohol/week: 1.0 standard drink of alcohol    Types: 1 Glasses of wine per week    Comment: "too much" - a bottle of wine a night    Drug use: No   Sexual activity: Yes    Birth control/protection: None

## 2022-08-08 ENCOUNTER — Encounter: Payer: Self-pay | Admitting: Physical Therapy

## 2022-08-08 ENCOUNTER — Other Ambulatory Visit: Payer: Self-pay

## 2022-08-08 ENCOUNTER — Ambulatory Visit: Payer: BC Managed Care – PPO | Admitting: Physical Therapy

## 2022-08-08 DIAGNOSIS — M25562 Pain in left knee: Secondary | ICD-10-CM

## 2022-08-08 DIAGNOSIS — M6281 Muscle weakness (generalized): Secondary | ICD-10-CM | POA: Diagnosis not present

## 2022-08-08 DIAGNOSIS — R2689 Other abnormalities of gait and mobility: Secondary | ICD-10-CM

## 2022-08-08 DIAGNOSIS — R2681 Unsteadiness on feet: Secondary | ICD-10-CM

## 2022-08-08 DIAGNOSIS — R600 Localized edema: Secondary | ICD-10-CM | POA: Diagnosis not present

## 2022-08-08 DIAGNOSIS — M25662 Stiffness of left knee, not elsewhere classified: Secondary | ICD-10-CM

## 2022-08-08 NOTE — Therapy (Signed)
OUTPATIENT PHYSICAL THERAPY LOWER EXTREMITY EVALUATION   Patient Name: Anna Glover MRN: [id number] DOB:1975/09/29, 47 y.o., female Today's Date: 08/08/2022  END OF SESSION:  PT End of Session - 08/08/22 1241     Visit Number 1    Number of Visits 16    Date for PT Re-Evaluation 09/30/22    Authorization Type BCBS state    Authorization Time Period $52 copay    Progress Note Due on Visit 10    PT Start Time 1100    PT Stop Time 1145    PT Time Calculation (min) 45 min    Activity Tolerance Patient tolerated treatment well    Behavior During Therapy [facility] for tasks assessed/performed             Past Medical History:  Diagnosis Date   Anxiety    Depression    Hypertension    Hypothyroidism    Osteoarthritis 2012   L knee   Past Surgical History:  Procedure Laterality Date   ADENOIDECTOMY  1991   BIOPSY  01/04/2021   Procedure: BIOPSY;  Surgeon: Tressia Danas, MD;  Location: [facility] ENDOSCOPY;  Service: Gastroenterology;;   CESAREAN SECTION  02/25/2003   CESAREAN SECTION  05/01/2009   CHOLECYSTECTOMY  07/15/2012   ESOPHAGOGASTRODUODENOSCOPY (EGD) WITH PROPOFOL N/A 01/04/2021   Procedure: ESOPHAGOGASTRODUODENOSCOPY (EGD) WITH PROPOFOL;  Surgeon: Tressia Danas, MD;  Location: [facility] ENDOSCOPY;  Service: Gastroenterology;  Laterality: N/A;   TOTAL KNEE ARTHROPLASTY Left 07/18/2022   Procedure: LEFT TOTAL KNEE REPLACEMENT;  Surgeon: Tarry Kos, MD;  Location: [facility] OR;  Service: Orthopedics;  Laterality: Left;   Patient Active Problem List   Diagnosis Date Noted   Chest pain, rule out acute myocardial infarction 07/20/2022   Near syncope 07/20/2022   S/P total knee arthroplasty, left 07/20/2022   Status post total left knee replacement 07/18/2022   Gastritis and gastroduodenitis    Acute esophagitis    RUQ pain    Alcohol dependence syndrome (HCC) 01/03/2021   Essential hypertension 01/03/2021   Hypothyroidism (acquired) 01/03/2021   Depression with anxiety  01/03/2021   Hematemesis 01/02/2021   Acute medial meniscus tear of left knee 06/30/2020   Primary osteoarthritis of right knee 03/31/2020   Primary osteoarthritis of left knee 03/31/2020   Headache 06/03/2014   [facility] spotted fever 06/03/2014   Thrombocytopenia (HCC) 06/03/2014   Elevated LFTs 06/03/2014    PCP: Courtney Paris, NP  REFERRING PROVIDER: Virgia Land. Dorise Hiss  REFERRING DIAG: [phone] (ICD-10-CM) - S/P total knee arthroplasty, left   THERAPY DIAG:  Stiffness of left knee, not elsewhere classified  Muscle weakness (generalized)  Acute pain of left knee  Localized edema  Other abnormalities of gait and mobility  Unsteadiness on feet  Rationale for Evaluation and Treatment: Rehabilitation  ONSET DATE: 07/18/2022  SUBJECTIVE:   SUBJECTIVE STATEMENT: This 47yo female underwent a left Total Knee Arthroplasty on 07/18/2022 due to OA. She had HHPT thru last week. She has CPM.   PERTINENT HISTORY: TKA left 07/18/22, Anxiety, depression, HTN, OA, 2016 [facility] Spotted Fever with no ongoing  PAIN:  NPRS scale: today 0/10 and in last week up to 3/10 Pain location: left knee medial & lateral joint line Pain description: sharp & pulling Aggravating factors: stretches, standing or walking >15 min Relieving factors: sit down, tylenol & at night muscle relaxor, elevation, ice  PRECAUTIONS: Fall  WEIGHT BEARING RESTRICTIONS: Yes LLE WBAT  FALLS:  Has patient fallen in last 6 months? 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 4 cm, below knee 42.4 cm RLE: above knee 48.4cm,  around knee 43.2 cm, below knee 39.4 cm  POSTURE: weight shift right  PALPATION: Tenderness along joint line and incision  LOWER EXTREMITY ROM:   ROM Right eval Left eval  Hip flexion    Hip extension    Hip abduction    Hip adduction    Hip internal rotation    Hip external rotation    Knee flexion  Seated A: 98* P: 104*  Knee extension  Seated A: quad set -6* LAQ -9* P: -4*  Ankle dorsiflexion    Ankle plantarflexion    Ankle inversion    Ankle eversion     (Blank rows = not tested)  LOWER EXTREMITY MMT:  MMT Right eval Left eval  Hip flexion    Hip extension    Hip abduction    Hip adduction    Hip internal rotation    Hip external rotation    Knee flexion  3-/5  Knee extension  3-/5  Ankle dorsiflexion    Ankle plantarflexion    Ankle inversion    Ankle eversion     (Blank rows = not tested)  FUNCTIONAL TESTS:  18 inch chair transfer: 5X sit to stand 17.06 sec Lt SLS: 23 sec Rt SLS: 17 sec  GAIT: Distance walked: 200' Assistive device utilized: None Level of assistance: SBA cues only for deviations Comments: antalgic with decreased LLE stance, left knee flexed in stance, decreased left knee flexion in swing   TODAY'S TREATMENT                                                                          DATE: 08/08/2022 Therex: HEP instruction/performance c cues for techniques, handout provided.  Trial set performed of each for comprehension and symptom assessment.  See below for exercise list PT  recommended elevating >/= 15 min >/= 2x/day with BLEs higher than heart and muscle activity alphabet with foot. CPM >1 hour 3x/day.  Max setting should be within passive range so her knee is not torqued to achieve higher range.  PATIENT EDUCATION:  Education details: HEP, POC Person educated: Patient Education method: Programmer, multimedia, Demonstration, Verbal cues, and Handouts Education comprehension: verbalized understanding, returned demonstration, and verbal cues required  HOME EXERCISE PROGRAM: Access Code: [email or web address] URL: [email or web address] Date:  08/08/2022 Prepared by: Vladimir Faster  Exercises - Ankle Alphabet in Elevation  - 2-4 x daily - 7 x weekly - 1 sets - 1 reps - supine quad set with towel roll under ankle  - 2-4 x daily - 7 x weekly - 3 sets - 10 reps - 5 seconds hold - Supine Heel Slide with Strap  - 2-3 x daily - 7 x weekly - 2-3 sets - 10 reps - 5 seconds hold - Supine Knee Extension Strengthening  - 2 x daily - 7 x weekly - 2-3 sets - 10 reps - 5 seconds hold - Supine Straight Leg Raises  - 2-3 x daily - 7 x weekly - 2-3 sets - 10 reps - 5 seconds hold - Seated Knee Flexion Extension AROM   - 2-4 x daily - 7 x weekly - 2-3 sets - 10 reps - 5 seconds hold - Seated straight leg lifts  - 2-3 x daily - 7 x weekly - 2-3 sets - 10 reps - 5 seconds hold - Seated Hamstring Stretch with Strap  - 2-4 x daily - 7 x weekly - 2-3 sets - 3 reps - 20-30 seconds hold - Seated Long Arc Quad  - 2-4 x daily - 7 x weekly - 2-3 sets - 10 reps - 5 seconds hold  ASSESSMENT: CLINICAL IMPRESSION: Patient is a 47 y.o. who comes to clinic with complaints of left knee pain with mobility, strength and movement coordination deficits that impair their ability to perform usual daily and recreational functional activities without increase difficulty/symptoms at this time.  Patient to benefit from skilled PT services to address impairments and limitations to improve to previous level of function  without restriction secondary to condition.   OBJECTIVE IMPAIRMENTS: Abnormal gait, decreased activity tolerance, decreased balance, decreased endurance, decreased knowledge of condition, decreased knowledge of use of DME, decreased mobility, difficulty walking, decreased ROM, decreased strength, increased edema, and pain.   ACTIVITY LIMITATIONS: carrying, lifting, bending, sitting, standing, squatting, sleeping, stairs, transfers, and locomotion level  PARTICIPATION LIMITATIONS: meal prep, cleaning, driving, community activity, occupation, and recreation  PERSONAL FACTORS: 1-2 comorbidities: see PMH  are also affecting patient's functional outcome.   REHAB POTENTIAL: Good  CLINICAL DECISION MAKING: Stable/uncomplicated  EVALUATION COMPLEXITY: Low   GOALS: Goals reviewed with patient? Yes  SHORT TERM GOALS: (target date for Short term goals 09/02/2022)   1.  Patient will demonstrate independent use of home exercise program to maintain progress from in clinic treatments. Baseline: See objective data Goal status: Initial  2. PROM left knee 0* ext to 115* flexion Baseline: See objective data Goal status: Initial  3. Interim FOTO 55% Baseline: See objective data Goal status: Initial  LONG TERM GOALS: (target dates for all long term goals  09/30/2022 )   1. Patient will demonstrate/report pain at worst less than or equal to 2/10 to facilitate minimal limitation in daily activity secondary to pain symptoms. Baseline: See objective data Goal status: Initial   2. Patient will demonstrate independent use of home exercise program to facilitate ability to maintain/progress functional gains from skilled physical therapy services. Baseline: See objective data Goal status: Initial   3. Patient will demonstrate FOTO outcome > or = 62% to indicate reduced disability due to condition. Baseline: See objective data Goal status: Initial   4.  Patient will demonstrate left knee MMT 5/5 to  faciltiate usual transfers, stairs, squatting at [facility] for daily life.  Baseline: See objective data Goal status: Initial  5.  Patient ambulate >1000' and negotiates ramps, curbs & stairs without device independently.  Baseline: See objective data Goal status: Initial   6.  patient will be able to demonstrate work related tasks including short distance run.  Baseline: See objective data Goal status: Initial    PLAN:  PT FREQUENCY:  2x/week  PT DURATION: 8 weeks  PLANNED INTERVENTIONS: Therapeutic exercises, Therapeutic activity, Neuro Muscular re-education, Balance training, Gait training, Patient/Family education, Joint mobilization, Stair training, DME instructions, Dry Needling, Electrical stimulation, Traction, Cryotherapy, vasopneumatic deviceMoist heat, Taping, Ultrasound, Ionotophoresis 4mg /ml Dexamethasone, and aquatic therapy, Manual therapy.  All included unless contraindicated  PLAN FOR NEXT SESSION: Review & Update HEP. Manual therapy & exercise for range. Vaso for edema.   Vladimir Faster, PT, DPT 08/08/2022, 12:58 PM

## 2022-08-11 ENCOUNTER — Ambulatory Visit: Payer: BC Managed Care – PPO | Admitting: Physical Therapy

## 2022-08-11 ENCOUNTER — Encounter: Payer: Self-pay | Admitting: Physical Therapy

## 2022-08-11 DIAGNOSIS — M6281 Muscle weakness (generalized): Secondary | ICD-10-CM | POA: Diagnosis not present

## 2022-08-11 DIAGNOSIS — M25562 Pain in left knee: Secondary | ICD-10-CM | POA: Diagnosis not present

## 2022-08-11 DIAGNOSIS — R600 Localized edema: Secondary | ICD-10-CM

## 2022-08-11 DIAGNOSIS — R2689 Other abnormalities of gait and mobility: Secondary | ICD-10-CM

## 2022-08-11 DIAGNOSIS — M25662 Stiffness of left knee, not elsewhere classified: Secondary | ICD-10-CM | POA: Diagnosis not present

## 2022-08-11 NOTE — Therapy (Signed)
OUTPATIENT PHYSICAL THERAPY TREATMENT NOTE   Patient Name: Anna Glover MRN: [id number] DOB:Jul 27, 1975, 47 y.o., female Today's Date: 08/11/2022  END OF SESSION:  PT End of Session - 08/11/22 1148     Visit Number 2    Number of Visits 16    Date for PT Re-Evaluation 09/30/22    Authorization Type BCBS state    Authorization Time Period $52 copay    PT Start Time 1149    PT Stop Time 1231    PT Time Calculation (min) 42 min    Activity Tolerance Patient tolerated treatment well    Behavior During Therapy [facility] for tasks assessed/performed              Past Medical History:  Diagnosis Date   Anxiety    Depression    Hypertension    Hypothyroidism    Osteoarthritis 2012   L knee   Past Surgical History:  Procedure Laterality Date   ADENOIDECTOMY  1991   BIOPSY  01/04/2021   Procedure: BIOPSY;  Surgeon: Tressia Danas, MD;  Location: [facility] ENDOSCOPY;  Service: Gastroenterology;;   CESAREAN SECTION  02/25/2003   CESAREAN SECTION  05/01/2009   CHOLECYSTECTOMY  07/15/2012   ESOPHAGOGASTRODUODENOSCOPY (EGD) WITH PROPOFOL N/A 01/04/2021   Procedure: ESOPHAGOGASTRODUODENOSCOPY (EGD) WITH PROPOFOL;  Surgeon: Tressia Danas, MD;  Location: [facility] ENDOSCOPY;  Service: Gastroenterology;  Laterality: N/A;   TOTAL KNEE ARTHROPLASTY Left 07/18/2022   Procedure: LEFT TOTAL KNEE REPLACEMENT;  Surgeon: Tarry Kos, MD;  Location: [facility] OR;  Service: Orthopedics;  Laterality: Left;   Patient Active Problem List   Diagnosis Date Noted   Chest pain, rule out acute myocardial infarction 07/20/2022   Near syncope 07/20/2022   S/P total knee arthroplasty, left 07/20/2022   Status post total left knee replacement 07/18/2022   Gastritis and gastroduodenitis    Acute esophagitis    RUQ pain    Alcohol dependence syndrome (HCC) 01/03/2021   Essential hypertension 01/03/2021   Hypothyroidism (acquired) 01/03/2021   Depression with anxiety 01/03/2021   Hematemesis 01/02/2021   Acute  medial meniscus tear of left knee 06/30/2020   Primary osteoarthritis of right knee 03/31/2020   Primary osteoarthritis of left knee 03/31/2020   Headache 06/03/2014   [facility] spotted fever 06/03/2014   Thrombocytopenia (HCC) 06/03/2014   Elevated LFTs 06/03/2014    PCP: Courtney Paris, NP  REFERRING PROVIDER: Virgia Land. Dorise Hiss  REFERRING DIAG: [phone] (ICD-10-CM) - S/P total knee arthroplasty, left   THERAPY DIAG:  Stiffness of left knee, not elsewhere classified  Muscle weakness (generalized)  Acute pain of left knee  Localized edema  Other abnormalities of gait and mobility  Rationale for Evaluation and Treatment: Rehabilitation  ONSET DATE: 07/18/2022 L TKA   SUBJECTIVE:   SUBJECTIVE STATEMENT: Pt states she is feeling great this morning, no pain. Felt good after last session, some pain later that night which she states is typical for her. Has been doing well with HEP.   PERTINENT HISTORY: TKA left 07/18/22, Anxiety, depression, HTN, OA, 2016 [facility] Spotted Fever with no ongoing  PAIN:  NPRS scale: today 0/10 and in last week up to 3/10 Pain location: left knee medial & lateral joint line Pain description: sharp & pulling Aggravating factors: stretches, standing or walking >15 min Relieving factors: sit down, tylenol & at night muscle relaxor, elevation, ice  PRECAUTIONS: Fall  WEIGHT BEARING RESTRICTIONS: Yes LLE WBAT  FALLS:  Has patient fallen in last 6 months? 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 4 cm, below knee 42.4 cm RLE: above knee 48.4cm,  around knee 43.2 cm, below knee 39.4 cm  POSTURE: weight shift right  PALPATION: Tenderness along joint line and incision  LOWER EXTREMITY ROM:   ROM Right eval Left eval Left 08/11/22  Hip flexion     Hip extension     Hip abduction     Hip adduction     Hip internal rotation     Hip external rotation     Knee flexion  Seated A: 98* P: 104* Supine AAROM 115 deg mild pain  Knee extension  Seated A: quad set -6* LAQ -9* P: -4*   Ankle dorsiflexion     Ankle plantarflexion     Ankle inversion     Ankle eversion      (Blank rows = not tested)  LOWER EXTREMITY MMT:  MMT Right eval Left eval  Hip flexion    Hip extension    Hip abduction    Hip adduction    Hip internal rotation    Hip external rotation    Knee flexion  3-/5  Knee extension  3-/5  Ankle dorsiflexion    Ankle plantarflexion    Ankle inversion    Ankle eversion     (Blank rows = not tested)  FUNCTIONAL TESTS:  18 inch chair transfer: 5X sit to stand 17.06 sec Lt SLS: 23 sec Rt SLS: 17 sec  GAIT: Distance walked: 200' Assistive device utilized: None Level of assistance: SBA cues only for deviations Comments: antalgic with decreased LLE stance, left knee flexed in stance, decreased left knee flexion in swing   TODAY'S TREATMENT                                                                           [facility] Adult PT Treatment:                                                DATE:  08/11/22 Therapeutic Exercise: LAQ 2x10 Supine heel slides with strap 2x10 Supine quad set with towel prop x12  SLR 2x8 cues for pacing and foot position  STS from raised mat x10 BW, 2x10 with 2 inch block under RLE  Standing hamstring curls RLE x6, x12 off of 2inch step for improved clearance into extension Education on relevant anatomy/physiology as it pertains to exercise in session, HEP, and general exercise outside of sessions     DATE: 08/08/2022 Therex: HEP instruction/performance c cues for techniques, handout provided.  Trial set performed of each for comprehension and symptom assessment.  See below for exercise list PT recommended elevating >/= 15 min >/= 2x/day with BLEs higher than heart and muscle activity alphabet with foot. CPM >1 hour 3x/day.  Max setting should be within passive range so her knee is not torqued to achieve higher range.  PATIENT EDUCATION:  Education details: rationale for interventions, HEP Person educated: Patient Education method: Programmer, multimedia, Demonstration, Verbal cues, and Handouts Education comprehension: verbalized understanding, returned demonstration, and verbal cues required  HOME EXERCISE PROGRAM: Access Code: [email or web address] URL: [email or web address] Date: 08/08/2022 Prepared by: Vladimir Faster  Exercises - Ankle Alphabet in Elevation  - 2-4 x daily - 7 x weekly - 1 sets - 1 reps - supine quad set with towel roll under ankle  - 2-4 x daily - 7 x weekly - 3 sets - 10 reps - 5 seconds hold - Supine Heel Slide with Strap  - 2-3 x daily - 7 x weekly - 2-3 sets - 10 reps - 5 seconds hold - Supine Knee Extension Strengthening  - 2 x daily - 7 x weekly - 2-3 sets - 10 reps - 5 seconds hold - Supine Straight Leg Raises  - 2-3 x daily - 7 x weekly - 2-3 sets - 10 reps - 5 seconds hold - Seated Knee Flexion Extension AROM   - 2-4 x daily - 7 x weekly - 2-3 sets - 10 reps - 5 seconds hold - Seated straight leg lifts  - 2-3 x daily - 7 x  weekly - 2-3 sets - 10 reps - 5 seconds hold - Seated Hamstring Stretch with Strap  - 2-4 x daily - 7 x weekly - 2-3 sets - 3 reps - 20-30 seconds hold - Seated Long Arc Quad  - 2-4 x daily - 7 x weekly - 2-3 sets - 10 reps - 5 seconds hold  ASSESSMENT: CLINICAL IMPRESSION: 08/11/2022 Pt arrives w/o pain, continues to endorse steady improvement. Today with noted improvement in knee flexion ROM, able to achieve 115 deg AAROM in supine with min pain. Tolerates today's session well with cues as above, increased time with activity with focus on slow, controlled movement in order to maximize muscular stability as she seems to be doing quite well in regards to mobility. No adverse events, no pain on departure. Recommend continuing along current POC in order to address relevant deficits and improve functional tolerance. Pt departs today's session in no acute distress, all voiced questions/concerns addressed appropriately from PT perspective.    Per eval - Patient is a 47 y.o. who comes to clinic with complaints of left knee pain with mobility, strength and movement coordination deficits that impair their ability to perform usual daily and recreational functional activities without increase difficulty/symptoms at this time.  Patient to benefit from skilled PT services to address impairments and limitations to improve to previous level of function without restriction secondary to condition.   OBJECTIVE IMPAIRMENTS: Abnormal gait, decreased activity tolerance, decreased balance, decreased endurance, decreased knowledge of condition, decreased knowledge of use of DME, decreased mobility, difficulty walking, decreased ROM, decreased strength, increased edema,  and pain.   ACTIVITY LIMITATIONS: carrying, lifting, bending, sitting, standing, squatting, sleeping, stairs, transfers, and locomotion level  PARTICIPATION LIMITATIONS: meal prep, cleaning, driving, community activity, occupation, and recreation  PERSONAL  FACTORS: 1-2 comorbidities: see PMH  are also affecting patient's functional outcome.   REHAB POTENTIAL: Good  CLINICAL DECISION MAKING: Stable/uncomplicated  EVALUATION COMPLEXITY: Low   GOALS: Goals reviewed with patient? Yes  SHORT TERM GOALS: (target date for Short term goals 09/02/2022)   1.  Patient will demonstrate independent use of home exercise program to maintain progress from in clinic treatments. Baseline: See objective data Goal status: Initial  2. PROM left knee 0* ext to 115* flexion Baseline: See objective data Goal status: Initial  3. Interim FOTO 55% Baseline: See objective data Goal status: Initial  LONG TERM GOALS: (target dates for all long term goals  09/30/2022 )   1. Patient will demonstrate/report pain at worst less than or equal to 2/10 to facilitate minimal limitation in daily activity secondary to pain symptoms. Baseline: See objective data Goal status: Initial   2. Patient will demonstrate independent use of home exercise program to facilitate ability to maintain/progress functional gains from skilled physical therapy services. Baseline: See objective data Goal status: Initial   3. Patient will demonstrate FOTO outcome > or = 62% to indicate reduced disability due to condition. Baseline: See objective data Goal status: Initial   4.  Patient will demonstrate left knee MMT 5/5 to faciltiate usual transfers, stairs, squatting at [facility] for daily life.  Baseline: See objective data Goal status: Initial   5.  Patient ambulate >1000' and negotiates ramps, curbs & stairs without device independently.  Baseline: See objective data Goal status: Initial   6.  patient will be able to demonstrate work related tasks including short distance run.  Baseline: See objective data Goal status: Initial    PLAN:  PT FREQUENCY:  2x/week  PT DURATION: 8 weeks  PLANNED INTERVENTIONS: Therapeutic exercises, Therapeutic activity, Neuro Muscular re-education,  Balance training, Gait training, Patient/Family education, Joint mobilization, Stair training, DME instructions, Dry Needling, Electrical stimulation, Traction, Cryotherapy, vasopneumatic deviceMoist heat, Taping, Ultrasound, Ionotophoresis 4mg /ml Dexamethasone, and aquatic therapy, Manual therapy.  All included unless contraindicated  PLAN FOR NEXT SESSION: review/update HEP PRN. Work on Electronics engineer, balance and gait mechanics.    Ashley Murrain PT, DPT 08/11/2022 12:57 PM

## 2022-08-12 NOTE — Therapy (Signed)
OUTPATIENT PHYSICAL THERAPY TREATMENT NOTE   Patient Name: Anna Glover MRN: [id number] DOB:[birth year], 47 y.o., female Today's Date: 08/15/2022  END OF SESSION:  PT End of Session - 08/15/22 1105     Visit Number 3    Number of Visits 16    Date for PT Re-Evaluation 09/30/22    Authorization Type [employer] state    Authorization Time Period $52 copay    Progress Note Due on Visit 10    PT Start Time 1104    PT Stop Time 1145    PT Time Calculation (min) 41 min    Activity Tolerance Patient tolerated treatment well;No increased pain    Behavior During Therapy [facility] for tasks assessed/performed               Past Medical History:  Diagnosis Date   Anxiety    Depression    Hypertension    Hypothyroidism    Osteoarthritis 2012   L knee   Past Surgical History:  Procedure Laterality Date   ADENOIDECTOMY  1991   BIOPSY  01/04/2021   Procedure: BIOPSY;  Surgeon: Tressia Danas, MD;  Location: [facility] ENDOSCOPY;  Service: Gastroenterology;;   CESAREAN SECTION  02/25/2003   CESAREAN SECTION  05/01/2009   CHOLECYSTECTOMY  07/15/2012   ESOPHAGOGASTRODUODENOSCOPY (EGD) WITH PROPOFOL N/A 01/04/2021   Procedure: ESOPHAGOGASTRODUODENOSCOPY (EGD) WITH PROPOFOL;  Surgeon: Tressia Danas, MD;  Location: [facility] ENDOSCOPY;  Service: Gastroenterology;  Laterality: N/A;   TOTAL KNEE ARTHROPLASTY Left 07/18/2022   Procedure: LEFT TOTAL KNEE REPLACEMENT;  Surgeon: Tarry Kos, MD;  Location: [facility] OR;  Service: Orthopedics;  Laterality: Left;   Patient Active Problem List   Diagnosis Date Noted   Chest pain, rule out acute myocardial infarction 07/20/2022   Near syncope 07/20/2022   S/P total knee arthroplasty, left 07/20/2022   Status post total left knee replacement 07/18/2022   Gastritis and gastroduodenitis    Acute esophagitis    RUQ pain    Alcohol dependence syndrome (HCC) 01/03/2021   Essential hypertension 01/03/2021   Hypothyroidism (acquired) 01/03/2021   Depression with  anxiety 01/03/2021   Hematemesis 01/02/2021   Acute medial meniscus tear of left knee 06/30/2020   Primary osteoarthritis of right knee 03/31/2020   Primary osteoarthritis of left knee 03/31/2020   Headache 06/03/2014   [facility] spotted fever 06/03/2014   Thrombocytopenia (HCC) 06/03/2014   Elevated LFTs 06/03/2014    PCP: Courtney Paris, NP  REFERRING PROVIDER: Virgia Land. Dorise Hiss  REFERRING DIAG: [phone] (ICD-10-CM) - S/P total knee arthroplasty, left   THERAPY DIAG:  Stiffness of left knee, not elsewhere classified  Muscle weakness (generalized)  Acute pain of left knee  Localized edema  Rationale for Evaluation and Treatment: Rehabilitation  ONSET DATE: 07/18/2022 L TKA   SUBJECTIVE:   SUBJECTIVE STATEMENT: Mild soreness after last session but denies anything significant. No pain at present. HEP going well.   PERTINENT HISTORY: TKA left 07/18/22, Anxiety, depression, HTN, OA, 2016 [facility] Spotted Fever with no ongoing  PAIN:  NPRS scale: today 0/10 and in last week up to 3/10 Pain location: left knee medial & lateral joint line Pain description: sharp & pulling Aggravating factors: stretches, standing or walking >15 min Relieving factors: sit down, tylenol & at night muscle relaxor, elevation, ice  PRECAUTIONS: Fall  WEIGHT BEARING RESTRICTIONS: Yes LLE WBAT  FALLS:  Has patient fallen in last 6 months? 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 4 cm, below knee 42.4 cm RLE: above knee 48.4cm,  around knee 43.2 cm, below knee 39.4 cm  POSTURE: weight shift right  PALPATION: Tenderness along joint line and incision  LOWER EXTREMITY ROM:   ROM Right eval Left eval Left 08/11/22 Left 08/15/22  Hip flexion      Hip extension      Hip abduction      Hip adduction      Hip internal rotation      Hip external rotation      Knee flexion  Seated A: 98* P: 104* Supine AAROM 115 deg mild pain A: 111 deg no pain, supine  Knee extension  Seated A: quad set -6* LAQ -9* P: -4*    Ankle dorsiflexion      Ankle plantarflexion      Ankle inversion      Ankle eversion       (Blank rows = not tested)  LOWER EXTREMITY MMT:  MMT Right eval Left eval  Hip flexion    Hip extension    Hip abduction    Hip adduction    Hip internal rotation    Hip external rotation    Knee flexion  3-/5  Knee extension  3-/5  Ankle dorsiflexion    Ankle plantarflexion    Ankle inversion    Ankle eversion     (Blank rows = not tested)  FUNCTIONAL TESTS:  18 inch chair transfer: 5X sit to stand 17.06 sec Lt SLS: 23 sec Rt SLS: 17 sec  GAIT: Distance walked: 200' Assistive device utilized: None Level of assistance: SBA cues only for deviations Comments: antalgic with decreased LLE stance, left knee flexed in stance, decreased left knee flexion in swing   TODAY'S TREATMENT                                                                           [facility] Adult PT Treatment:                                                DATE: 08/15/22 Therapeutic  Exercise: Prone hamstring curls unresisted 2x12 cues for pacing, reduced velocity Supine SLR 2x8 each (neutral, IR, ER)  for quad endurance  STS w 2 inch block under RLE 2x15 cues for pacing Mini lunge onto bosu x15 cues for stability, // bars, cues for appropriate ROM and setup 4 inch step up x10 in // bars, cues for pacing 6 inch step up x8 in // bars, cues for concentric/eccentric velocity and pacing Education on relevant anatomy/physiology as it pertains to exercise in session and HEP   [facility] Adult PT Treatment:                                                DATE: 08/11/22 Therapeutic Exercise: LAQ 2x10 Supine heel slides with strap 2x10 Supine quad set with towel prop x12  SLR 2x8 cues for pacing and foot position STS from raised mat x10 BW, 2x10 with 2 inch block under RLE  Standing hamstring curls RLE x6, x12 off of 2inch step for improved clearance into extension Education on relevant anatomy/physiology as it pertains to exercise in session, HEP, and general exercise outside of sessions     DATE: 08/08/2022 Therex: HEP instruction/performance c cues for techniques, handout provided.  Trial set performed of each for comprehension and symptom assessment.  See below for exercise list PT recommended elevating >/= 15 min >/= 2x/day with BLEs higher than heart and muscle activity alphabet with foot. CPM >1 hour 3x/day.  Max setting should be within passive range so her knee is not torqued to achieve higher range.  PATIENT EDUCATION:  Education details: rationale for interventions, HEP Person educated: Patient Education method: Programmer, multimedia, Demonstration, Verbal cues, and Handouts Education comprehension: verbalized understanding, returned demonstration, and verbal cues required  HOME EXERCISE PROGRAM: Access Code: [email or web address] URL: [email or web address] Date: 08/08/2022 Prepared by: Vladimir Faster  Exercises - Ankle Alphabet in Elevation  - 2-4 x daily - 7 x weekly -  1 sets - 1 reps - supine quad set with towel roll under ankle  - 2-4 x daily - 7 x weekly - 3 sets - 10 reps - 5 seconds hold - Supine Heel Slide with Strap  - 2-3 x daily - 7 x weekly - 2-3 sets - 10 reps - 5 seconds hold - Supine Knee Extension Strengthening  - 2 x daily - 7 x weekly - 2-3 sets - 10 reps - 5 seconds hold - Supine Straight Leg Raises  - 2-3 x daily - 7 x weekly - 2-3 sets - 10 reps - 5 seconds hold - Seated Knee Flexion Extension AROM   - 2-4 x daily - 7 x weekly - 2-3 sets - 10 reps - 5 seconds hold - Seated straight leg lifts  - 2-3 x daily - 7 x weekly - 2-3 sets - 10 reps - 5 seconds hold - Seated Hamstring Stretch with Strap  - 2-4 x daily - 7 x weekly - 2-3 sets - 3 reps - 20-30 seconds hold - Seated Long Arc Quad  - 2-4 x daily - 7 x weekly - 2-3 sets - 10 reps - 5 seconds hold  ASSESSMENT: CLINICAL IMPRESSION: 08/15/2022 Pt arrives w/o pain, no issues after last session. Continuing to progress open and closed chain strengthening within pt tolerance with introduction of closed chain balance exercises. Increased time with exercises today as primary goal is facilitating increased time under tension with low load exercises to promote increased stability throughout available ROM. No adverse events, endorses  some muscular fatigue but no pain. Recommend continuing along current POC in order to address relevant deficits and improve functional tolerance. Pt departs today's session in no acute distress, all voiced questions/concerns addressed appropriately from PT perspective.    Per eval - Patient is a 47 y.o. who comes to clinic with complaints of left knee pain with mobility, strength and movement coordination deficits that impair their ability to perform usual daily and recreational functional activities without increase difficulty/symptoms at this time.  Patient to benefit from skilled PT services to address impairments and limitations to improve to previous level of function without  restriction secondary to condition.   OBJECTIVE IMPAIRMENTS: Abnormal gait, decreased activity tolerance, decreased balance, decreased endurance, decreased knowledge of condition, decreased knowledge of use of DME, decreased mobility, difficulty walking, decreased ROM, decreased strength, increased edema, and pain.   ACTIVITY LIMITATIONS: carrying, lifting, bending, sitting, standing, squatting, sleeping, stairs, transfers, and locomotion level  PARTICIPATION LIMITATIONS: meal prep, cleaning, driving, community activity, occupation, and recreation  PERSONAL FACTORS: 1-2 comorbidities: see PMH  are also affecting patient's functional outcome.   REHAB POTENTIAL: Good  CLINICAL DECISION MAKING: Stable/uncomplicated  EVALUATION COMPLEXITY: Low   GOALS: Goals reviewed with patient? Yes  SHORT TERM GOALS: (target date for Short term goals 09/02/2022)   1.  Patient will demonstrate independent use of home exercise program to maintain progress from in clinic treatments. Baseline: See objective data Goal status: Initial  2. PROM left knee 0* ext to 115* flexion Baseline: See objective data Goal status: Initial  3. Interim FOTO 55% Baseline: See objective data Goal status: Initial  LONG TERM GOALS: (target dates for all long term goals  09/30/2022 )   1. Patient will demonstrate/report pain at worst less than or equal to 2/10 to facilitate minimal limitation in daily activity secondary to pain symptoms. Baseline: See objective data Goal status: Initial   2. Patient will demonstrate independent use of home exercise program to facilitate ability to maintain/progress functional gains from skilled physical therapy services. Baseline: See objective data Goal status: Initial   3. Patient will demonstrate FOTO outcome > or = 62% to indicate reduced disability due to condition. Baseline: See objective data Goal status: Initial   4.  Patient will demonstrate left knee MMT 5/5 to faciltiate  usual transfers, stairs, squatting at [facility] for daily life.  Baseline: See objective data Goal status: Initial   5.  Patient ambulate >1000' and negotiates ramps, curbs & stairs without device independently.  Baseline: See objective data Goal status: Initial   6.  patient will be able to demonstrate work related tasks including short distance run.  Baseline: See objective data Goal status: Initial    PLAN:  PT FREQUENCY:  2x/week  PT DURATION: 8 weeks  PLANNED INTERVENTIONS: Therapeutic exercises, Therapeutic activity, Neuro Muscular re-education, Balance training, Gait training, Patient/Family education, Joint mobilization, Stair training, DME instructions, Dry Needling, Electrical stimulation, Traction, Cryotherapy, vasopneumatic deviceMoist heat, Taping, Ultrasound, Ionotophoresis 4mg /ml Dexamethasone, and aquatic therapy, Manual therapy.  All included unless contraindicated  PLAN FOR NEXT SESSION: review/update HEP PRN. Work on Electronics engineer, balance and gait mechanics.     Ashley Murrain PT, DPT 08/15/2022 12:41 PM

## 2022-08-15 ENCOUNTER — Ambulatory Visit: Payer: BC Managed Care – PPO | Admitting: Physical Therapy

## 2022-08-15 ENCOUNTER — Encounter: Payer: Self-pay | Admitting: Physical Therapy

## 2022-08-15 DIAGNOSIS — M25562 Pain in left knee: Secondary | ICD-10-CM | POA: Diagnosis not present

## 2022-08-15 DIAGNOSIS — M25662 Stiffness of left knee, not elsewhere classified: Secondary | ICD-10-CM | POA: Diagnosis not present

## 2022-08-15 DIAGNOSIS — M6281 Muscle weakness (generalized): Secondary | ICD-10-CM

## 2022-08-15 DIAGNOSIS — R600 Localized edema: Secondary | ICD-10-CM | POA: Diagnosis not present

## 2022-08-17 ENCOUNTER — Encounter: Payer: Self-pay | Admitting: Physical Therapy

## 2022-08-17 ENCOUNTER — Ambulatory Visit: Payer: BC Managed Care – PPO | Admitting: Physical Therapy

## 2022-08-17 DIAGNOSIS — R2681 Unsteadiness on feet: Secondary | ICD-10-CM

## 2022-08-17 DIAGNOSIS — R600 Localized edema: Secondary | ICD-10-CM | POA: Diagnosis not present

## 2022-08-17 DIAGNOSIS — M25662 Stiffness of left knee, not elsewhere classified: Secondary | ICD-10-CM

## 2022-08-17 DIAGNOSIS — M25562 Pain in left knee: Secondary | ICD-10-CM

## 2022-08-17 DIAGNOSIS — R2689 Other abnormalities of gait and mobility: Secondary | ICD-10-CM

## 2022-08-17 DIAGNOSIS — M6281 Muscle weakness (generalized): Secondary | ICD-10-CM | POA: Diagnosis not present

## 2022-08-17 NOTE — Therapy (Signed)
OUTPATIENT PHYSICAL THERAPY TREATMENT NOTE   Patient Name: Anna Glover MRN: [id number] DOB:Jul 13, 1975, 47 y.o., female Today's Date: 08/17/2022  END OF SESSION:  PT End of Session - 08/17/22 0924     Visit Number 4    Number of Visits 16    Date for PT Re-Evaluation 09/30/22    Authorization Type [employer] state    Authorization Time Period $52 copay    Progress Note Due on Visit 10    PT Start Time 0925    PT Stop Time 1022    PT Time Calculation (min) 57 min    Activity Tolerance Patient tolerated treatment well;No increased pain    Behavior During Therapy [facility] for tasks assessed/performed               Past Medical History:  Diagnosis Date   Anxiety    Depression    Hypertension    Hypothyroidism    Osteoarthritis 2012   L knee   Past Surgical History:  Procedure Laterality Date   ADENOIDECTOMY  1991   BIOPSY  01/04/2021   Procedure: BIOPSY;  Surgeon: Tressia Danas, MD;  Location: [facility] ENDOSCOPY;  Service: Gastroenterology;;   CESAREAN SECTION  02/25/2003   CESAREAN SECTION  05/01/2009   CHOLECYSTECTOMY  07/15/2012   ESOPHAGOGASTRODUODENOSCOPY (EGD) WITH PROPOFOL N/A 01/04/2021   Procedure: ESOPHAGOGASTRODUODENOSCOPY (EGD) WITH PROPOFOL;  Surgeon: Tressia Danas, MD;  Location: [facility] ENDOSCOPY;  Service: Gastroenterology;  Laterality: N/A;   TOTAL KNEE ARTHROPLASTY Left 07/18/2022   Procedure: LEFT TOTAL KNEE REPLACEMENT;  Surgeon: Tarry Kos, MD;  Location: [facility] OR;  Service: Orthopedics;  Laterality: Left;   Patient Active Problem List   Diagnosis Date Noted   Chest pain, rule out acute myocardial infarction 07/20/2022   Near syncope 07/20/2022   S/P total knee arthroplasty, left 07/20/2022   Status post total left knee replacement 07/18/2022   Gastritis and gastroduodenitis    Acute esophagitis    RUQ pain    Alcohol dependence syndrome (HCC) 01/03/2021   Essential hypertension 01/03/2021   Hypothyroidism (acquired) 01/03/2021   Depression with  anxiety 01/03/2021   Hematemesis 01/02/2021   Acute medial meniscus tear of left knee 06/30/2020   Primary osteoarthritis of right knee 03/31/2020   Primary osteoarthritis of left knee 03/31/2020   Headache 06/03/2014   [facility] spotted fever 06/03/2014   Thrombocytopenia (HCC) 06/03/2014   Elevated LFTs 06/03/2014    PCP: Courtney Paris, NP  REFERRING PROVIDER: Virgia Land. Dorise Hiss  REFERRING DIAG: [phone] (ICD-10-CM) - S/P total knee arthroplasty, left   THERAPY DIAG:  Stiffness of left knee, not elsewhere classified  Muscle weakness (generalized)  Acute pain of left knee  Localized edema  Other abnormalities of gait and mobility  Unsteadiness on feet  Rationale for Evaluation and Treatment: Rehabilitation  ONSET DATE: 07/18/2022 L TKA   SUBJECTIVE:   SUBJECTIVE STATEMENT: She has [employer] but has not gone back yet.    PERTINENT HISTORY: TKA left 07/18/22, Anxiety, depression, HTN, OA, 2016 [facility] Spotted Fever with no ongoing  PAIN:  NPRS scale: today 0/10 and in last week up to 4-5/10 Pain location: left knee medial & lateral joint line Pain description: sharp & pulling Aggravating factors: stretches, standing or walking >15 min Relieving factors: sit down, tylenol & at night muscle relaxor, elevation, ice  PRECAUTIONS: Fall  WEIGHT BEARING RESTRICTIONS: Yes LLE WBAT  FALLS:  Has patient fallen in last 6 months? 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 4 cm, below knee 42.4 cm RLE: above knee 48.4cm,  around knee 43.2 cm, below knee 39.4 cm  POSTURE: weight shift right  PALPATION: Tenderness along joint line and incision  LOWER EXTREMITY ROM:   ROM Right eval Left eval Left 08/11/22 Left 08/15/22  Hip flexion      Hip extension      Hip abduction      Hip adduction      Hip internal rotation      Hip external rotation      Knee flexion  Seated A: 98* P: 104* Supine AAROM 115 deg mild pain A: 111 deg no pain, supine  Knee extension  Seated A: quad set -6* LAQ -9* P: -4*    Ankle dorsiflexion      Ankle plantarflexion      Ankle inversion      Ankle eversion       (Blank rows = not tested)  LOWER EXTREMITY MMT:  MMT Right eval Left eval  Hip flexion    Hip extension    Hip abduction    Hip adduction    Hip internal rotation    Hip external rotation    Knee flexion  3-/5  Knee extension  3-/5  Ankle dorsiflexion    Ankle plantarflexion    Ankle inversion    Ankle eversion     (Blank rows = not tested)  FUNCTIONAL TESTS:  18 inch chair transfer: 5X sit to stand 17.06 sec Lt SLS: 23 sec Rt SLS: 17 sec  GAIT: Distance walked: 200' Assistive device utilized: None Level of assistance: SBA cues only for deviations Comments: antalgic with decreased LLE stance, left knee flexed in stance, decreased left knee flexion in swing   TODAY'S TREATMENT                                  DATE:  08/17/2022: Therapeutic Exercise: Recumbent bike seat 9 level 3 for 8 min Gastroc stretch on incline board  20 sec hold 2 reps BLE heel raises 15 reps Squat over chair wide stance 10 reps with PT demo & verbal cues on technique Hamstring stretch seated LLE long sit strap 20 sec hold 2 reps Quad stretch hooklying LLE over edge strap knee flexion 20 sec hold 2 reps Prone hamstring curls unresisted 2x12 cues for pacing, reduced velocity Supine SLR 10 each (neutral, IR, ER) for quad endurance  Prone hamstring curls unresisted 10 reps with towel roll under thigh for knee ext ROM /  quad set reaching toes to floor  Education on components (endurance, flexibility, strength & balance) to HEP with controlled progression. Start back at [facility] only doing seated cardio like seated stepper & bike. Pt verbalized understanding.  Neuromuscular Re-ed: Standing on foam pad reaching LE towards 3 target cones (ant-lat, lat, post-lat) with stance knee flexion outward and extension inward motion. 5 reps ea LE. Needs single UE support on stance side. PT demo & verbal cues on technique.   Vaso Left knee medium compression 34* 10 min with elevation    TREATMENT                                  DATE: 08/15/22 Therapeutic Exercise: Prone hamstring curls unresisted 2x12 cues for pacing, reduced velocity Supine SLR 2x8 each (neutral, IR, ER) for quad endurance  STS w 2 inch block under RLE 2x15 cues for pacing Mini lunge onto bosu x15 cues for stability, // bars, cues for appropriate ROM and setup 4 inch step up x10 in // bars, cues for pacing 6 inch step up x8 in // bars, cues for concentric/eccentric velocity and pacing Education on relevant anatomy/physiology as it pertains to exercise in session and HEP   Treatment:                                                DATE: 08/11/22 Therapeutic Exercise: LAQ 2x10 Supine heel slides with strap 2x10 Supine quad set with towel prop x12  SLR 2x8 cues for pacing and foot position STS from raised mat x10 BW, 2x10 with 2 inch block under RLE  Standing hamstring curls RLE x6, x12  off of 2inch step for improved clearance into extension Education on relevant anatomy/physiology as it pertains to exercise in session, HEP, and general exercise outside of sessions     PATIENT EDUCATION:  Education details: rationale for interventions, HEP Person educated: Patient Education method: Programmer, multimedia, Demonstration, Verbal cues, and Handouts Education comprehension: verbalized understanding, returned demonstration, and verbal cues required  HOME EXERCISE PROGRAM: Access Code: [facility] URL: [email or web address] Date: 08/08/2022 Prepared by: Vladimir Faster  Exercises - Ankle Alphabet in Elevation  - 2-4 x daily - 7 x weekly - 1 sets - 1 reps - supine quad set with towel roll under ankle  - 2-4 x daily - 7 x weekly - 3 sets - 10 reps - 5 seconds hold - Supine Heel Slide with Strap  - 2-3 x daily - 7 x weekly - 2-3 sets - 10 reps - 5 seconds hold - Supine Knee Extension Strengthening  - 2 x daily - 7 x weekly - 2-3 sets - 10 reps - 5 seconds hold - Supine Straight Leg Raises  - 2-3 x daily - 7 x weekly - 2-3 sets - 10 reps - 5 seconds hold - Seated Knee Flexion Extension AROM   - 2-4 x daily - 7 x weekly - 2-3 sets - 10 reps - 5 seconds hold - Seated straight leg lifts  - 2-3 x daily - 7 x weekly - 2-3 sets - 10 reps - 5 seconds hold - Seated Hamstring Stretch with Strap  - 2-4 x daily - 7 x weekly - 2-3 sets - 3 reps - 20-30 seconds hold - Seated Long Arc Quad  - 2-4 x  daily - 7 x weekly - 2-3 sets - 10 reps - 5 seconds hold  ASSESSMENT: CLINICAL IMPRESSION: Patient appears to understand recommendation for [employer] seated endurance machines.  She is improving with functional strength and mobility.  Pt continues to benefit from skilled PT s/p TKA.   OBJECTIVE IMPAIRMENTS: Abnormal gait, decreased activity tolerance, decreased balance, decreased endurance, decreased knowledge of condition, decreased knowledge of use of DME, decreased mobility, difficulty walking,  decreased ROM, decreased strength, increased edema, and pain.   ACTIVITY LIMITATIONS: carrying, lifting, bending, sitting, standing, squatting, sleeping, stairs, transfers, and locomotion level  PARTICIPATION LIMITATIONS: meal prep, cleaning, driving, community activity, occupation, and recreation  PERSONAL FACTORS: 1-2 comorbidities: see PMH  are also affecting patient's functional outcome.   REHAB POTENTIAL: Good  CLINICAL DECISION MAKING: Stable/uncomplicated  EVALUATION COMPLEXITY: Low   GOALS: Goals reviewed with patient? Yes  SHORT TERM GOALS: (target date for Short term goals 09/02/2022)   1.  Patient will demonstrate independent use of home exercise program to maintain progress from in clinic treatments. Baseline: See objective data Goal status: Ongoing 08/17/2022  2. PROM left knee 0* ext to 115* flexion Baseline: See objective data Goal status: Ongoing 08/17/2022  3. Interim FOTO 55% Baseline: See objective data Goal status: Ongoing 08/17/2022  LONG TERM GOALS: (target dates for all long term goals  09/30/2022 )   1. Patient will demonstrate/report pain at worst less than or equal to 2/10 to facilitate minimal limitation in daily activity secondary to pain symptoms. Baseline: See objective data Goal status: Ongoing 08/17/2022   2. Patient will demonstrate independent use of home exercise program to facilitate ability to maintain/progress functional gains from skilled physical therapy services. Baseline: See objective data Goal status: Ongoing 08/17/2022   3. Patient will demonstrate FOTO outcome > or = 62% to indicate reduced disability due to condition. Baseline: See objective data Goal status: Ongoing 08/17/2022   4.  Patient will demonstrate left knee MMT 5/5 to faciltiate usual transfers, stairs, squatting at [facility] for daily life.  Baseline: See objective data Goal status: Ongoing 08/17/2022   5.  Patient ambulate >1000' and negotiates ramps, curbs & stairs without  device independently.  Baseline: See objective data Goal status: Ongoing 08/17/2022   6.  patient will be able to demonstrate work related tasks including short distance run.  Baseline: See objective data Goal status: Ongoing 08/17/2022    PLAN:  PT FREQUENCY:  2x/week  PT DURATION: 8 weeks  PLANNED INTERVENTIONS: Therapeutic exercises, Therapeutic activity, Neuro Muscular re-education, Balance training, Gait training, Patient/Family education, Joint mobilization, Stair training, DME instructions, Dry Needling, Electrical stimulation, Traction, Cryotherapy, vasopneumatic deviceMoist heat, Taping, Ultrasound, Ionotophoresis 4mg /ml Dexamethasone, and aquatic therapy, Manual therapy.  All included unless contraindicated  PLAN FOR NEXT SESSION: send progress note to MD,  check how [employer] is going,  Work on Electronics engineer with functional activities, balance and gait mechanics.      Vladimir Faster, PT, DPT 08/17/2022, 10:17 AM

## 2022-08-22 ENCOUNTER — Encounter: Payer: Self-pay | Admitting: Physical Therapy

## 2022-08-22 ENCOUNTER — Ambulatory Visit: Payer: BC Managed Care – PPO | Admitting: Physical Therapy

## 2022-08-22 DIAGNOSIS — M25562 Pain in left knee: Secondary | ICD-10-CM | POA: Diagnosis not present

## 2022-08-22 DIAGNOSIS — R2689 Other abnormalities of gait and mobility: Secondary | ICD-10-CM

## 2022-08-22 DIAGNOSIS — M25662 Stiffness of left knee, not elsewhere classified: Secondary | ICD-10-CM

## 2022-08-22 DIAGNOSIS — M6281 Muscle weakness (generalized): Secondary | ICD-10-CM

## 2022-08-22 DIAGNOSIS — R2681 Unsteadiness on feet: Secondary | ICD-10-CM

## 2022-08-22 DIAGNOSIS — R600 Localized edema: Secondary | ICD-10-CM | POA: Diagnosis not present

## 2022-08-22 NOTE — Therapy (Signed)
OUTPATIENT PHYSICAL THERAPY TREATMENT NOTE & PROGRESS NOTE   Patient Name: Anna Glover MRN: [id number] DOB:November 25, 1975, 47 y.o., female Today's Date: 08/22/2022  END OF SESSION:  PT End of Session - 08/22/22 0922     Visit Number 5    Number of Visits 16    Date for PT Re-Evaluation 09/30/22    Authorization Type [employer] state    Authorization Time Period $52 copay    Progress Note Due on Visit 15    PT Start Time 0922    PT Stop Time 1025    PT Time Calculation (min) 63 min    Activity Tolerance Patient tolerated treatment well;No increased pain    Behavior During Therapy [facility] for tasks assessed/performed               Past Medical History:  Diagnosis Date   Anxiety    Depression    Hypertension    Hypothyroidism    Osteoarthritis 2012   L knee   Past Surgical History:  Procedure Laterality Date   ADENOIDECTOMY  1991   BIOPSY  01/04/2021   Procedure: BIOPSY;  Surgeon: Tressia Danas, MD;  Location: [facility] ENDOSCOPY;  Service: Gastroenterology;;   CESAREAN SECTION  02/25/2003   CESAREAN SECTION  05/01/2009   CHOLECYSTECTOMY  07/15/2012   ESOPHAGOGASTRODUODENOSCOPY (EGD) WITH PROPOFOL N/A 01/04/2021   Procedure: ESOPHAGOGASTRODUODENOSCOPY (EGD) WITH PROPOFOL;  Surgeon: Tressia Danas, MD;  Location: [facility] ENDOSCOPY;  Service: Gastroenterology;  Laterality: N/A;   TOTAL KNEE ARTHROPLASTY Left 07/18/2022   Procedure: LEFT TOTAL KNEE REPLACEMENT;  Surgeon: Tarry Kos, MD;  Location: [facility] OR;  Service: Orthopedics;  Laterality: Left;   Patient Active Problem List   Diagnosis Date Noted   Chest pain, rule out acute myocardial infarction 07/20/2022   Near syncope 07/20/2022   S/P total knee arthroplasty, left 07/20/2022   Status post total left knee replacement 07/18/2022   Gastritis and gastroduodenitis    Acute esophagitis    RUQ pain    Alcohol dependence syndrome (HCC) 01/03/2021   Essential hypertension 01/03/2021   Hypothyroidism (acquired) 01/03/2021    Depression with anxiety 01/03/2021   Hematemesis 01/02/2021   Acute medial meniscus tear of left knee 06/30/2020   Primary osteoarthritis of right knee 03/31/2020   Primary osteoarthritis of left knee 03/31/2020   Headache 06/03/2014   [facility] spotted fever 06/03/2014   Thrombocytopenia (HCC) 06/03/2014   Elevated LFTs 06/03/2014    PCP: Courtney Paris, NP  REFERRING PROVIDER: Virgia Land. Dorise Hiss  REFERRING DIAG: [phone] (ICD-10-CM) - S/P total knee arthroplasty, left   THERAPY DIAG:  Stiffness of left knee, not elsewhere classified  Muscle weakness (generalized)  Acute pain of left knee  Localized edema  Other abnormalities of gait and mobility  Unsteadiness on feet  Rationale for Evaluation and Treatment: Rehabilitation  ONSET DATE: 07/18/2022 L TKA   SUBJECTIVE:   SUBJECTIVE STATEMENT: She went to [facility] and did bike for 20 minutes. Her knee swelled & elevated / iced and went down within 1 hour. She was distracted.  She went for 2 walks 30 min without issues.   PERTINENT HISTORY: TKA left 07/18/22, Anxiety, depression, HTN, OA, 2016 [facility] Spotted Fever with no ongoing  PAIN:  NPRS scale: today 0/10 and in last week up to  4-5/10 Pain location: left knee medial & lateral joint line Pain description: sharp & pulling Aggravating factors: stretches, standing or walking >15 min Relieving factors: sit down, tylenol & at night muscle relaxor, elevation,  ice  PRECAUTIONS: Fall  WEIGHT BEARING RESTRICTIONS: Yes LLE WBAT  FALLS:  Has patient fallen in last 6 months? 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 4 cm, below knee 42.4 cm RLE: above knee 48.4cm,  around knee 43.2 cm, below knee 39.4 cm  POSTURE: weight shift right  PALPATION: Tenderness along joint line and incision  LOWER EXTREMITY ROM:   ROM Right eval Left eval Left 08/11/22 Left 08/15/22 Left 08/22/22  Hip flexion       Hip extension       Hip abduction       Hip adduction       Hip internal rotation       Hip external rotation       Knee flexion  Seated A: 98* P: 104* Supine AAROM 115 deg mild pain A: 111 deg no pain, supine Supine A: 111* P: 114*  Knee extension  Seated A: quad set -6* LAQ -9* P: -4*   Seated LAQ -3* Supine P: 0*  Ankle dorsiflexion       Ankle plantarflexion       Ankle inversion       Ankle eversion        (Blank rows = not tested)  LOWER EXTREMITY MMT:  MMT Left eval Right 08/22/22 Left 08/22/22  Hip flexion     Hip extension     Hip abduction     Hip adduction     Hip internal rotation     Hip external rotation     Knee flexion 3-/5    Knee extension 3-/5 HH dynameter 89.8# HH dynameter 55# 51.8# LLE:RLE 59%  Ankle dorsiflexion     Ankle plantarflexion     Ankle inversion     Ankle eversion      (Blank rows = not tested)  FUNCTIONAL TESTS:  18 inch chair transfer: 5X sit to stand 17.06 sec Lt SLS: 23 sec Rt SLS: 17 sec  GAIT: Distance walked: 200' Assistive device utilized: None Level of assistance: SBA cues only  for deviations Comments: antalgic with decreased LLE stance, left knee flexed in stance, decreased left knee flexion in swing   TODAY'S TREATMENT                                  DATE:  08/22/2022: Therapeutic Exercise: Recumbent bike seat 9 level 4 for 8 min Gastroc stretch on step heel depression 20 sec hold 2 reps BLE heel raises & isometric/eccentric LLE 15 reps Squat  over chair wide stance blue T-band 15 reps with PT demo & verbal cues on technique Seated hamstring curl blue T-band LLE 15 reps Leg press BLEs 100# 10 reps 2 sets, LLE only 43# 1 set PT updated HEP to include above exercises including demo, verbal & electronic HO cues. Pt verbalized and return demo understanding.   See objective data  Vaso Left knee medium compression 34* 10 min with elevation    TREATMENT                                  DATE:  08/17/2022: Therapeutic Exercise: Recumbent bike seat 9 level 3 for 8 min Gastroc stretch on incline board 20 sec hold 2 reps BLE heel raises 15 reps Squat over chair wide stance 10 reps with PT demo & verbal cues on technique Hamstring stretch seated LLE long sit strap 20 sec hold 2 reps Quad stretch hooklying LLE over edge strap knee flexion 20 sec hold 2 reps Prone hamstring curls unresisted 2x12 cues for pacing, reduced velocity Supine SLR 10 each (neutral, IR, ER) for quad endurance  Prone hamstring curls unresisted 10 reps with towel roll under thigh for knee ext ROM / quad set reaching toes to floor  Education on components (endurance, flexibility, strength & balance) to HEP with controlled progression. Start back at [facility] only doing seated cardio like seated stepper & bike. Pt verbalized understanding.  Neuromuscular Re-ed: Standing on foam pad reaching LE towards 3 target cones (ant-lat, lat, post-lat) with stance knee flexion outward and extension inward motion. 5 reps ea LE. Needs single UE support on stance side. PT demo & verbal cues on technique.   Vaso Left knee  medium compression 34* 10 min with elevation    TREATMENT                                  DATE: 08/15/22 Therapeutic Exercise: Prone hamstring curls unresisted 2x12 cues for pacing, reduced velocity Supine SLR 2x8 each (neutral, IR, ER) for quad endurance  STS w 2 inch block under RLE 2x15 cues for pacing Mini lunge onto bosu x15 cues for stability, // bars, cues for appropriate ROM and setup 4 inch step up x10 in // bars, cues for pacing 6 inch step up x8 in // bars, cues for concentric/eccentric velocity and pacing Education on relevant anatomy/physiology as it pertains to exercise in session and HEP    PATIENT EDUCATION:  Education details: rationale for interventions, HEP Person educated: Patient Education method: Programmer, multimedia, Demonstration, Verbal cues, and Handouts Education comprehension: verbalized understanding, returned demonstration, and verbal cues required  HOME EXERCISE PROGRAM: Access Code: [facility] URL: [email or web address] Date: 08/22/2022 Prepared by: Vladimir Faster  Exercises - Supine Straight Leg Raises  - 2-3 x daily - 7 x weekly - 2-3 sets - 10 reps - 5 seconds hold - Seated straight leg lifts  - 2-3 x daily - 7 x weekly - 2-3 sets - 10 reps - 5 seconds hold - Seated Hamstring Stretch with Strap  - 2-4 x daily - 7 x weekly - 2-3 sets - 3 reps - 20-30 seconds hold - Seated Long Arc Quad  - 2-4 x daily - 7 x weekly - 2-3 sets - 10 reps - 5 seconds hold - Gastroc Stretch on Step  - 2 x daily - 7 x weekly - 1 sets -  3 reps - 20-30 seconds hold - Standing Eccentric Heel Raise  - 1 x daily - 7 x weekly - 2 sets - 15 reps - 5 seconds hold - squat over chair like "dirty toilet"  - 1 x daily - 7 x weekly - 2 sets - 15 reps - 5 seconds hold - Seated Hamstring Curl with Anchored Resistance  - 1 x daily - 7 x weekly - 2 sets - 15 reps - 5 seconds hold - Supine Quadriceps Stretch with Strap on Table  - 2 x daily - 7 x weekly - 1 sets - 3 reps - 20-30 seconds  hold  ASSESSMENT: CLINICAL IMPRESSION: Patient has improved ROM and functional strength.  Hand Held dynameter strength test shows LLE is 59% of RLE for quads.  Pt continues to benefit from skilled PT s/p TKA.   OBJECTIVE IMPAIRMENTS: Abnormal gait, decreased activity tolerance, decreased balance, decreased endurance, decreased knowledge of condition, decreased knowledge of use of DME, decreased mobility, difficulty walking, decreased ROM, decreased strength, increased edema, and pain.   ACTIVITY LIMITATIONS: carrying, lifting, bending, sitting, standing, squatting, sleeping, stairs, transfers, and locomotion level  PARTICIPATION LIMITATIONS: meal prep, cleaning, driving, community activity, occupation, and recreation  PERSONAL FACTORS: 1-2 comorbidities: see PMH  are also affecting patient's functional outcome.   REHAB POTENTIAL: Good  CLINICAL DECISION MAKING: Stable/uncomplicated  EVALUATION COMPLEXITY: Low   GOALS: Goals reviewed with patient? Yes  SHORT TERM GOALS: (target date for Short term goals 09/02/2022)   1.  Patient will demonstrate independent use of home exercise program to maintain progress from in clinic treatments. Baseline: See objective data Goal status: MET 08/22/2022  2. PROM left knee 0* ext to 115* flexion Baseline: See objective data Goal status: MET 08/22/2022  3. Interim FOTO 55% Baseline: See objective data Goal status: Ongoing 08/22/2022  LONG TERM GOALS: (target dates for all long term goals  09/30/2022 )   1. Patient will demonstrate/report pain at worst less than or equal to 2/10 to facilitate minimal limitation in daily activity secondary to pain symptoms. Baseline: See objective data Goal status: Ongoing 08/17/2022   2. Patient will demonstrate independent use of home exercise program to facilitate ability to maintain/progress functional gains from skilled physical therapy services. Baseline: See objective data Goal status: Ongoing 08/17/2022   3.  Patient will demonstrate FOTO outcome > or = 62% to indicate reduced disability due to condition. Baseline: See objective data Goal status: Ongoing 08/17/2022   4.  Patient will demonstrate left knee MMT 5/5 to faciltiate usual transfers, stairs, squatting at [facility] for daily life.  Baseline: See objective data Goal status: Ongoing 08/17/2022   5.  Patient ambulate >1000' and negotiates ramps, curbs & stairs without device independently.  Baseline: See objective data Goal status: Ongoing 08/17/2022   6.  patient will be able to demonstrate work related tasks including short distance run.  Baseline: See objective data Goal status: Ongoing 08/17/2022    PLAN:  PT FREQUENCY:  2x/week  PT DURATION: 8 weeks  PLANNED INTERVENTIONS: Therapeutic exercises, Therapeutic activity, Neuro Muscular re-education, Balance training, Gait training, Patient/Family education, Joint mobilization, Stair training, DME instructions, Dry Needling, Electrical stimulation, Traction, Cryotherapy, vasopneumatic deviceMoist heat, Taping, Ultrasound, Ionotophoresis 4mg /ml Dexamethasone, and aquatic therapy, Manual therapy.  All included unless contraindicated  PLAN FOR NEXT SESSION: check updated HEP, make sure pt understands issues with beach,  Work on muscular control/stability with functional activities, balance and gait mechanics.      Vladimir Faster, PT, DPT  08/22/2022, 11:21 AM

## 2022-08-22 NOTE — Progress Notes (Unsigned)
   Post-Op Visit Note   Patient: Anna Glover           Date of Birth: 01/16/1976           MRN: [id number] Visit Date: 08/23/2022 PCP: Courtney Paris, NP   Assessment & Plan:  Chief Complaint: No chief complaint on file.  Visit Diagnoses:  1. Status post total left knee replacement     Plan: ***  Follow-Up Instructions: No follow-ups on file.   Orders:  No orders of the defined types were placed in this encounter.  No orders of the defined types were placed in this encounter.   Imaging: No results found.  PMFS History: Patient Active Problem List   Diagnosis Date Noted   Chest pain, rule out acute myocardial infarction 07/20/2022   Near syncope 07/20/2022   Status post total left knee replacement 07/18/2022   Gastritis and gastroduodenitis    Acute esophagitis    RUQ pain    Alcohol dependence syndrome (HCC) 01/03/2021   Essential hypertension 01/03/2021   Hypothyroidism (acquired) 01/03/2021   Depression with anxiety 01/03/2021   Hematemesis 01/02/2021   Acute medial meniscus tear of left knee 06/30/2020   Primary osteoarthritis of right knee 03/31/2020   Primary osteoarthritis of left knee 03/31/2020   Headache 06/03/2014   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   Thrombocytopenia (HCC) 06/03/2014   Elevated LFTs 06/03/2014   Past Medical History:  Diagnosis Date   Anxiety    Depression    Hypertension    Hypothyroidism    Osteoarthritis 2012   L knee    Family History  Problem Relation Age of Onset   Hypertension Mother    Hyperlipidemia Mother    Hypertension Father    Heart disease Father    Pulmonary fibrosis Father    Heart attack Father 20   Migraines Neg Hx     Past Surgical History:  Procedure Laterality Date   ADENOIDECTOMY  1991   BIOPSY  01/04/2021   Procedure: BIOPSY;  Surgeon: Tressia Danas, MD;  Location: [facility] ENDOSCOPY;  Service: Gastroenterology;;   CESAREAN SECTION  02/25/2003   CESAREAN SECTION  05/01/2009    CHOLECYSTECTOMY  07/15/2012   ESOPHAGOGASTRODUODENOSCOPY (EGD) WITH PROPOFOL N/A 01/04/2021   Procedure: ESOPHAGOGASTRODUODENOSCOPY (EGD) WITH PROPOFOL;  Surgeon: Tressia Danas, MD;  Location: [facility] ENDOSCOPY;  Service: Gastroenterology;  Laterality: N/A;   TOTAL KNEE ARTHROPLASTY Left 07/18/2022   Procedure: LEFT TOTAL KNEE REPLACEMENT;  Surgeon: Tarry Kos, MD;  Location: [facility] OR;  Service: Orthopedics;  Laterality: Left;   Social History   Occupational History   Occupation: Midwife  Tobacco Use   Smoking status: Former    Years: 10    Types: Cigarettes    Quit date: 07/10/2009    Years since quitting: 13.1   Smokeless tobacco: Never  Vaping Use   Vaping Use: Never used  Substance and Sexual Activity   Alcohol use: Yes    Alcohol/week: 1.0 standard drink of alcohol    Types: 1 Glasses of wine per week    Comment: "too much" - a bottle of wine a night   Drug use: No   Sexual activity: Yes    Birth control/protection: None

## 2022-08-23 ENCOUNTER — Ambulatory Visit (INDEPENDENT_AMBULATORY_CARE_PROVIDER_SITE_OTHER): Payer: BC Managed Care – PPO | Admitting: Orthopaedic Surgery

## 2022-08-23 ENCOUNTER — Other Ambulatory Visit (INDEPENDENT_AMBULATORY_CARE_PROVIDER_SITE_OTHER): Payer: BC Managed Care – PPO

## 2022-08-23 DIAGNOSIS — Z96652 Presence of left artificial knee joint: Secondary | ICD-10-CM

## 2022-08-24 ENCOUNTER — Encounter: Payer: Self-pay | Admitting: Physical Therapy

## 2022-08-24 ENCOUNTER — Ambulatory Visit: Payer: BC Managed Care – PPO | Admitting: Physical Therapy

## 2022-08-24 DIAGNOSIS — M25662 Stiffness of left knee, not elsewhere classified: Secondary | ICD-10-CM

## 2022-08-24 DIAGNOSIS — M25562 Pain in left knee: Secondary | ICD-10-CM | POA: Diagnosis not present

## 2022-08-24 DIAGNOSIS — R600 Localized edema: Secondary | ICD-10-CM

## 2022-08-24 DIAGNOSIS — M6281 Muscle weakness (generalized): Secondary | ICD-10-CM | POA: Diagnosis not present

## 2022-08-24 DIAGNOSIS — R2689 Other abnormalities of gait and mobility: Secondary | ICD-10-CM

## 2022-08-24 DIAGNOSIS — R2681 Unsteadiness on feet: Secondary | ICD-10-CM

## 2022-08-24 NOTE — Therapy (Signed)
OUTPATIENT PHYSICAL THERAPY TREATMENT NOTE   Patient Name: Anna Glover MRN: [id number] DOB:[birth year], 47 y.o., female Today's Date: 08/24/2022  END OF SESSION:  PT End of Session - 08/24/22 1259     Visit Number 6    Number of Visits 16    Date for PT Re-Evaluation 09/30/22    Authorization Type [employer] state    Authorization Time Period $52 copay    Progress Note Due on Visit 15    PT Start Time 1259    PT Stop Time 1355    PT Time Calculation (min) 56 min    Activity Tolerance Patient tolerated treatment well;No increased pain    Behavior During Therapy [facility] for tasks assessed/performed                Past Medical History:  Diagnosis Date   Anxiety    Depression    Hypertension    Hypothyroidism    Osteoarthritis 2012   L knee   Past Surgical History:  Procedure Laterality Date   ADENOIDECTOMY  1991   BIOPSY  01/04/2021   Procedure: BIOPSY;  Surgeon: Tressia Danas, MD;  Location: [facility] ENDOSCOPY;  Service: Gastroenterology;;   CESAREAN SECTION  02/25/2003   CESAREAN SECTION  05/01/2009   CHOLECYSTECTOMY  07/15/2012   ESOPHAGOGASTRODUODENOSCOPY (EGD) WITH PROPOFOL N/A 01/04/2021   Procedure: ESOPHAGOGASTRODUODENOSCOPY (EGD) WITH PROPOFOL;  Surgeon: Tressia Danas, MD;  Location: [facility] ENDOSCOPY;  Service: Gastroenterology;  Laterality: N/A;   TOTAL KNEE ARTHROPLASTY Left 07/18/2022   Procedure: LEFT TOTAL KNEE REPLACEMENT;  Surgeon: Tarry Kos, MD;  Location: [facility] OR;  Service: Orthopedics;  Laterality: Left;   Patient Active Problem List   Diagnosis Date Noted   Chest pain, rule out acute myocardial infarction 07/20/2022   Near syncope 07/20/2022   Status post total left knee replacement 07/18/2022   Gastritis and gastroduodenitis    Acute esophagitis    RUQ pain    Alcohol dependence syndrome (HCC) 01/03/2021   Essential hypertension 01/03/2021   Hypothyroidism (acquired) 01/03/2021   Depression with anxiety 01/03/2021   Hematemesis 01/02/2021    Acute medial meniscus tear of left knee 06/30/2020   Primary osteoarthritis of right knee 03/31/2020   Primary osteoarthritis of left knee 03/31/2020   Headache 06/03/2014   [facility] spotted fever 06/03/2014   Thrombocytopenia (HCC) 06/03/2014   Elevated LFTs 06/03/2014    PCP: Courtney Paris, NP  REFERRING PROVIDER: Virgia Land. Dorise Hiss  REFERRING DIAG: [phone] (ICD-10-CM) - S/P total knee arthroplasty, left   THERAPY DIAG:  Stiffness of left knee, not elsewhere classified  Muscle weakness (generalized)  Acute pain of left knee  Localized edema  Other abnormalities of gait and mobility  Unsteadiness on feet  Rationale for Evaluation and Treatment: Rehabilitation  ONSET DATE: 07/18/2022 L TKA   SUBJECTIVE:   SUBJECTIVE STATEMENT: The doctor said no pool for 3 months.   PERTINENT HISTORY: TKA left 07/18/22, Anxiety, depression, HTN, OA, 2016 [facility] Spotted Fever with no ongoing  PAIN:  NPRS scale: today  0/10 and in last week up to  4-5/10 Pain location: left knee medial & lateral joint line Pain description: sharp & pulling Aggravating factors: stretches, standing or walking >15 min Relieving factors: sit down, tylenol & at night muscle relaxor, elevation, ice  PRECAUTIONS: Fall  WEIGHT BEARING RESTRICTIONS: Yes LLE WBAT  FALLS:  Has patient fallen in last 6 months? 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 4 cm, below knee 42.4 cm RLE: above knee 48.4cm,  around knee 43.2 cm, below knee 39.4 cm  POSTURE: weight shift right  PALPATION: Tenderness along joint line and incision  LOWER EXTREMITY ROM:   ROM Right eval Left eval Left 08/11/22 Left 08/15/22 Left 08/22/22  Hip flexion       Hip extension       Hip abduction       Hip adduction       Hip internal rotation       Hip external rotation       Knee flexion  Seated A: 98* P: 104* Supine AAROM 115 deg mild pain A: 111 deg no pain, supine Supine A: 111* P: 114*  Knee extension  Seated A: quad set -6* LAQ -9* P: -4*   Seated LAQ -3* Supine P: 0*  Ankle dorsiflexion       Ankle plantarflexion       Ankle inversion       Ankle eversion        (Blank rows = not tested)  LOWER EXTREMITY MMT:  MMT Left eval Right 08/22/22 Left 08/22/22  Hip flexion     Hip extension     Hip abduction     Hip adduction     Hip internal rotation     Hip external rotation     Knee flexion 3-/5    Knee extension 3-/5 HH dynameter 89.8# HH dynameter 55# 51.8# LLE:RLE 59%  Ankle dorsiflexion     Ankle plantarflexion     Ankle inversion     Ankle eversion      (Blank rows = not tested)  FUNCTIONAL TESTS:  18 inch chair transfer: 5X sit to stand 17.06 sec Lt SLS: 23 sec Rt SLS: 17 sec  GAIT: Distance walked: 200' Assistive device utilized: None Level of assistance: SBA cues only for deviations Comments: antalgic with decreased LLE stance, left knee flexed in stance, decreased left knee flexion in swing   TODAY'S TREATMENT                                  DATE:   08/24/2022: Therapeutic Exercise: Recumbent bike seat 9 level 4 for 8 min Plank with sink then alternate LEs touching knee to counter with push-off stance LE 10 reps ea. Seated hamstring curl blue T-band LLE 15 reps Standing LLE TKE blue theraband 10 reps 1st set only TKE, 2nd set LLE TKE tapping RLE on cone Leg press BLEs 100# 15 reps 1 sets, Pt request for instruction in use  of recumbent stepper for [employer]. PT demo & verbal cues on set-up and moving seat forward for increased flexion. Nustep level 7 BLEs & BUEs 3 min for instruction. Pt verbalized and return demo understanding.    Neuromuscular Re-education: Slider to 3 cones (ant-lat, lat & post-lat) with stance knee flexion on outward motion and extension on inward motion 5 reps BLEs.  Therapeutic Activities: Stairs alternating pattern single rail 11 steps 2 sets PT demo & verbal cues on technique Functional squat 5 reps with cues for midline Amb 64' carrying 10# in RUE & then 70' in LUE   Vaso Left knee medium compression 34* 10 min with elevation   TREATMENT                                  DATE:  08/22/2022: Therapeutic Exercise: Recumbent bike seat 9 level 4 for 8 min Gastroc stretch on step heel depression 20 sec hold 2 reps BLE heel raises & isometric/eccentric LLE 15 reps Squat over chair wide stance blue T-band 15 reps with PT demo & verbal cues on technique Seated hamstring curl blue T-band LLE 15 reps Leg press BLEs 100# 10 reps 2 sets, LLE only 43# 1 set PT updated HEP to include above exercises including demo, verbal & electronic HO cues. Pt verbalized and return demo understanding.   See objective data  Vaso Left knee medium compression 34* 10 min with elevation    TREATMENT                                  DATE:  08/17/2022: Therapeutic Exercise: Recumbent bike seat 9 level 3 for 8 min Gastroc stretch on incline board 20 sec hold 2 reps BLE heel raises 15 reps Squat over chair wide stance 10 reps with PT demo &  verbal cues on technique Hamstring stretch seated LLE long sit strap 20 sec hold 2 reps Quad stretch hooklying LLE over edge strap knee flexion 20 sec hold 2 reps Prone hamstring curls unresisted 2x12 cues for pacing, reduced velocity Supine SLR 10 each (neutral, IR, ER) for quad endurance  Prone hamstring curls unresisted 10 reps with towel roll under thigh for knee ext ROM / quad set reaching toes to floor  Education on components (endurance, flexibility, strength & balance) to HEP with controlled progression. Start back at [facility] only doing seated cardio like seated stepper & bike. Pt verbalized understanding.  Neuromuscular Re-ed: Standing on foam pad reaching LE towards 3 target cones (ant-lat, lat, post-lat) with stance knee flexion outward and extension inward motion. 5 reps ea LE. Needs single UE support on stance side. PT demo & verbal cues on technique.   Vaso Left knee medium compression 34* 10 min with elevation    PATIENT EDUCATION:  Education details: rationale for interventions, HEP Person educated: Patient Education method: Explanation, Demonstration, Verbal cues, and Handouts Education comprehension: verbalized understanding, returned demonstration, and verbal cues required  HOME EXERCISE PROGRAM: Access Code: [email or web address] URL: [email or web address] Date: 08/22/2022 Prepared by: Vladimir Faster  Exercises - Supine Straight Leg Raises  - 2-3 x daily - 7 x weekly - 2-3 sets - 10 reps - 5 seconds hold - Seated straight leg lifts  - 2-3 x daily - 7 x weekly - 2-3 sets - 10 reps - 5 seconds hold - Seated Hamstring Stretch with Strap  - 2-4 x  daily - 7 x weekly - 2-3 sets - 3 reps - 20-30 seconds hold - Seated Long Arc Quad  - 2-4 x daily - 7 x weekly - 2-3 sets - 10 reps - 5 seconds hold - Gastroc Stretch on Step  - 2 x daily - 7 x weekly - 1 sets - 3 reps - 20-30 seconds hold - Standing Eccentric Heel Raise  - 1 x daily - 7 x weekly - 2 sets - 15 reps - 5 seconds  hold - squat over chair like "dirty toilet"  - 1 x daily - 7 x weekly - 2 sets - 15 reps - 5 seconds hold - Seated Hamstring Curl with Anchored Resistance  - 1 x daily - 7 x weekly - 2 sets - 15 reps - 5 seconds hold - Supine Quadriceps Stretch with Strap on Table  - 2 x daily - 7 x weekly - 1 sets - 3 reps - 20-30 seconds hold  ASSESSMENT: CLINICAL IMPRESSION: PT progressed exercises for functional motion which she tolerated well.  Patient is slowly improving her functional strength.  Pt continues to benefit from skilled PT s/p TKA.   OBJECTIVE IMPAIRMENTS: Abnormal gait, decreased activity tolerance, decreased balance, decreased endurance, decreased knowledge of condition, decreased knowledge of use of DME, decreased mobility, difficulty walking, decreased ROM, decreased strength, increased edema, and pain.   ACTIVITY LIMITATIONS: carrying, lifting, bending, sitting, standing, squatting, sleeping, stairs, transfers, and locomotion level  PARTICIPATION LIMITATIONS: meal prep, cleaning, driving, community activity, occupation, and recreation  PERSONAL FACTORS: 1-2 comorbidities: see PMH  are also affecting patient's functional outcome.   REHAB POTENTIAL: Good  CLINICAL DECISION MAKING: Stable/uncomplicated  EVALUATION COMPLEXITY: Low   GOALS: Goals reviewed with patient? Yes  SHORT TERM GOALS: (target date for Short term goals 09/02/2022)   1.  Patient will demonstrate independent use of home exercise program to maintain progress from in clinic treatments. Baseline: See objective data Goal status: MET 08/22/2022  2. PROM left knee 0* ext to 115* flexion Baseline: See objective data Goal status: MET 08/22/2022  3. Interim FOTO 55% Baseline: See objective data Goal status: Ongoing 08/22/2022  LONG TERM GOALS: (target dates for all long term goals  09/30/2022 )   1. Patient will demonstrate/report pain at worst less than or equal to 2/10 to facilitate minimal limitation in daily  activity secondary to pain symptoms. Baseline: See objective data Goal status: Ongoing 08/17/2022   2. Patient will demonstrate independent use of home exercise program to facilitate ability to maintain/progress functional gains from skilled physical therapy services. Baseline: See objective data Goal status: Ongoing 08/17/2022   3. Patient will demonstrate FOTO outcome > or = 62% to indicate reduced disability due to condition. Baseline: See objective data Goal status: Ongoing 08/17/2022   4.  Patient will demonstrate left knee MMT 5/5 to faciltiate usual transfers, stairs, squatting at [facility] for daily life.  Baseline: See objective data Goal status: Ongoing 08/17/2022   5.  Patient ambulate >1000' and negotiates ramps, curbs & stairs without device independently.  Baseline: See objective data Goal status: Ongoing 08/17/2022   6.  patient will be able to demonstrate work related tasks including short distance run.  Baseline: See objective data Goal status: Ongoing 08/17/2022    PLAN:  PT FREQUENCY:  2x/week  PT DURATION: 8 weeks  PLANNED INTERVENTIONS: Therapeutic exercises, Therapeutic activity, Neuro Muscular re-education, Balance training, Gait training, Patient/Family education, Joint mobilization, Stair training, DME instructions, Dry Needling, Electrical stimulation, Traction, Cryotherapy,  vasopneumatic deviceMoist heat, Taping, Ultrasound, Ionotophoresis 4mg /ml Dexamethasone, and aquatic therapy, Manual therapy.  All included unless contraindicated  PLAN FOR NEXT SESSION: do interim FOTO, Work on Electronics engineer with functional activities, balance and gait mechanics.  Add knee ext & flex machines to program.     Vladimir Faster, PT, DPT 08/24/2022, 5:06 PM

## 2022-09-05 ENCOUNTER — Ambulatory Visit (INDEPENDENT_AMBULATORY_CARE_PROVIDER_SITE_OTHER): Payer: BC Managed Care – PPO | Admitting: Physical Therapy

## 2022-09-05 ENCOUNTER — Encounter: Payer: Self-pay | Admitting: Physical Therapy

## 2022-09-05 DIAGNOSIS — M25662 Stiffness of left knee, not elsewhere classified: Secondary | ICD-10-CM

## 2022-09-05 DIAGNOSIS — M25562 Pain in left knee: Secondary | ICD-10-CM | POA: Diagnosis not present

## 2022-09-05 DIAGNOSIS — M6281 Muscle weakness (generalized): Secondary | ICD-10-CM | POA: Diagnosis not present

## 2022-09-05 DIAGNOSIS — R600 Localized edema: Secondary | ICD-10-CM

## 2022-09-05 DIAGNOSIS — R2689 Other abnormalities of gait and mobility: Secondary | ICD-10-CM

## 2022-09-05 DIAGNOSIS — R2681 Unsteadiness on feet: Secondary | ICD-10-CM

## 2022-09-05 NOTE — Therapy (Signed)
OUTPATIENT PHYSICAL THERAPY TREATMENT NOTE   Patient Name: Anna Glover MRN: [id number] DOB:1975/11/15, 47 y.o., female Today's Date: 09/05/2022  END OF SESSION:  PT End of Session - 09/05/22 1024     Visit Number 7    Number of Visits 16    Date for PT Re-Evaluation 09/30/22    Authorization Type [employer] state    Authorization Time Period $52 copay    Progress Note Due on Visit 15    PT Start Time 1022    PT Stop Time 1100    PT Time Calculation (min) 38 min    Activity Tolerance Patient tolerated treatment well;No increased pain    Behavior During Therapy [facility] for tasks assessed/performed                 Past Medical History:  Diagnosis Date   Anxiety    Depression    Hypertension    Hypothyroidism    Osteoarthritis 2012   L knee   Past Surgical History:  Procedure Laterality Date   ADENOIDECTOMY  1991   BIOPSY  01/04/2021   Procedure: BIOPSY;  Surgeon: Tressia Danas, MD;  Location: [facility] ENDOSCOPY;  Service: Gastroenterology;;   CESAREAN SECTION  02/25/2003   CESAREAN SECTION  05/01/2009   CHOLECYSTECTOMY  07/15/2012   ESOPHAGOGASTRODUODENOSCOPY (EGD) WITH PROPOFOL N/A 01/04/2021   Procedure: ESOPHAGOGASTRODUODENOSCOPY (EGD) WITH PROPOFOL;  Surgeon: Tressia Danas, MD;  Location: [facility] ENDOSCOPY;  Service: Gastroenterology;  Laterality: N/A;   TOTAL KNEE ARTHROPLASTY Left 07/18/2022   Procedure: LEFT TOTAL KNEE REPLACEMENT;  Surgeon: Tarry Kos, MD;  Location: [facility] OR;  Service: Orthopedics;  Laterality: Left;   Patient Active Problem List   Diagnosis Date Noted   Chest pain, rule out acute myocardial infarction 07/20/2022   Near syncope 07/20/2022   Status post total left knee replacement 07/18/2022   Gastritis and gastroduodenitis    Acute esophagitis    RUQ pain    Alcohol dependence syndrome (HCC) 01/03/2021   Essential hypertension 01/03/2021   Hypothyroidism (acquired) 01/03/2021   Depression with anxiety 01/03/2021   Hematemesis 01/02/2021    Acute medial meniscus tear of left knee 06/30/2020   Primary osteoarthritis of right knee 03/31/2020   Primary osteoarthritis of left knee 03/31/2020   Headache 06/03/2014   [facility] spotted fever 06/03/2014   Thrombocytopenia (HCC) 06/03/2014   Elevated LFTs 06/03/2014    PCP: Courtney Paris, NP  REFERRING PROVIDER: Virgia Land. Dorise Hiss  REFERRING DIAG: [phone] (ICD-10-CM) - S/P total knee arthroplasty, left   THERAPY DIAG:  Stiffness of left knee, not elsewhere classified  Muscle weakness (generalized)  Acute pain of left knee  Localized edema  Other abnormalities of gait and mobility  Unsteadiness on feet  Rationale for Evaluation and Treatment: Rehabilitation  ONSET DATE: 07/18/2022 L TKA   SUBJECTIVE:   SUBJECTIVE STATEMENT: She went to beach for week on vacation.  She did exercises but not as much as recommended.    PERTINENT HISTORY: TKA left 07/18/22, Anxiety, depression, HTN, OA, 2016 [facility] Spotted Fever with no ongoing  PAIN:  NPRS scale: today 0/10 and no pain since last PT 10 days ago Pain location: left knee medial & lateral joint line Pain description: sharp & pulling Aggravating factors: stretches, standing or walking >15 min Relieving factors: sit down, tylenol & at night muscle relaxor, elevation, ice  PRECAUTIONS: Fall  WEIGHT BEARING RESTRICTIONS: Yes LLE WBAT  FALLS:  Has patient fallen in last 6 months? 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 4 cm, below knee 42.4 cm RLE: above knee 48.4cm,  around knee 43.2 cm, below knee 39.4 cm  POSTURE: weight shift right  PALPATION: Tenderness along joint line and incision  LOWER EXTREMITY ROM:   ROM Right eval Left eval Left 08/11/22 Left 08/15/22 Left 08/22/22  Hip flexion       Hip extension       Hip abduction       Hip adduction       Hip internal rotation       Hip external rotation       Knee flexion  Seated A: 98* P: 104* Supine AAROM 115 deg mild pain A: 111 deg no pain, supine Supine A: 111* P: 114*  Knee extension  Seated A: quad set -6* LAQ -9* P: -4*   Seated LAQ -3* Supine P: 0*  Ankle dorsiflexion       Ankle plantarflexion       Ankle inversion       Ankle eversion        (Blank rows = not tested)  LOWER EXTREMITY MMT:  MMT Left eval Right 08/22/22 Left 08/22/22  Hip flexion     Hip extension     Hip abduction     Hip adduction     Hip internal rotation     Hip external rotation     Knee flexion 3-/5    Knee extension 3-/5 HH dynameter 89.8# HH dynameter 55# 51.8# LLE:RLE 59%  Ankle dorsiflexion     Ankle plantarflexion     Ankle inversion     Ankle eversion      (Blank rows = not tested)  FUNCTIONAL TESTS:  18 inch chair transfer: 5X sit to stand 17.06 sec Lt SLS: 23 sec Rt SLS: 17 sec  GAIT: Distance walked: 200' Assistive device utilized: None Level of assistance: SBA cues only for deviations Comments: antalgic with decreased LLE stance, left knee flexed in stance, decreased left  knee flexion in swing   TODAY'S TREATMENT                                  DATE:  09/05/2022: Therapeutic Exercise: Recumbent bike seat 9 level 4 for 6 min Side step squat with green T-band around knees 10 reps to right & to left.   Neuromuscular Re-education: Slider to 3 cones (ant-lat, lat & post-lat) with stance knee flexion on outward motion and extension on inward motion  5 reps BLEs.  Therapeutic Activities: Functional squat 5 reps with cues for midline Kneeling in chair -stand to tall knee leading with RLE & off lead with LLE 10 reps.  Progressed to reaching single UE & BUEs overhead for 2 min.  Lunge kneel bw 2 chairs with BUE support onto pillow 10 reps ea LE.  Quick side step to 4 pods 30 sec 3 bouts (8 hits, 10 & 11) with 15 sec rest bw. Forward / backward suicide lunge to 4 pods 3' apart.  1st attempt 18.62sec, 2nd 16.25sec & 3rd 16.56sec.  PT recommended standing for 20 min of each work hour during day to build stamina for return to work. When 20 min per hour for 8 hours is not challenging, then try 30 min ea hour. Then 40 min & 45 min. Pt verbalized understanding including rationale.    TREATMENT                                  DATE:  08/24/2022: Therapeutic Exercise: Recumbent bike seat 9 level 4 for 8 min Plank with sink then alternate LEs touching knee to counter with push-off stance LE 10 reps ea. Seated hamstring curl blue T-band LLE 15 reps Standing LLE TKE blue theraband 10 reps 1st set only TKE, 2nd set LLE TKE tapping RLE on cone Leg press BLEs 100# 15 reps 1 sets, Pt request for instruction in use of recumbent stepper for [employer]. PT demo & verbal cues on set-up and moving seat forward for increased flexion. Nustep level 7 BLEs & BUEs 3 min for instruction. Pt verbalized and return demo understanding.    Neuromuscular Re-education: Slider to 3 cones (ant-lat, lat & post-lat) with stance knee flexion on outward motion and extension on inward motion 5 reps  BLEs.  Therapeutic Activities: Stairs alternating pattern single rail 11 steps 2 sets PT demo & verbal cues on technique Functional squat 5 reps with cues for midline Amb 84' carrying 10# in RUE & then 49' in LUE   Vaso Left knee medium compression 34* 10 min with elevation   TREATMENT                                  DATE:  08/22/2022: Therapeutic Exercise: Recumbent bike seat 9 level 4 for 8 min Gastroc stretch on step heel depression 20 sec hold 2 reps BLE heel raises & isometric/eccentric LLE 15 reps Squat over chair wide stance blue T-band 15 reps with PT demo & verbal cues on technique Seated hamstring curl blue T-band LLE 15 reps Leg press BLEs 100# 10 reps 2 sets, LLE only 43# 1 set PT updated HEP to include above exercises including demo, verbal & electronic HO cues. Pt verbalized and return demo understanding.   See objective data  Vaso Left knee medium compression 34* 10 min with elevation     PATIENT EDUCATION:  Education details: rationale for interventions, HEP Person educated: Patient Education method: Explanation, Demonstration, Verbal cues, and Handouts Education comprehension: verbalized understanding, returned demonstration, and verbal cues required  HOME EXERCISE PROGRAM: Access Code: [email or web address] URL: [email or web address] Date: 08/22/2022 Prepared by: Vladimir Faster  Exercises - Supine Straight Leg Raises  - 2-3 x daily - 7 x weekly - 2-3 sets - 10 reps - 5 seconds hold - Seated straight leg lifts  - 2-3 x daily - 7 x  weekly - 2-3 sets - 10 reps - 5 seconds hold - Seated Hamstring Stretch with Strap  - 2-4 x daily - 7 x weekly - 2-3 sets - 3 reps - 20-30 seconds hold - Seated Long Arc Quad  - 2-4 x daily - 7 x weekly - 2-3 sets - 10 reps - 5 seconds hold - Gastroc Stretch on Step  - 2 x daily - 7 x weekly - 1 sets - 3 reps - 20-30 seconds hold - Standing Eccentric Heel Raise  - 1 x daily - 7 x weekly - 2 sets - 15 reps - 5 seconds hold - squat  over chair like "dirty toilet"  - 1 x daily - 7 x weekly - 2 sets - 15 reps - 5 seconds hold - Seated Hamstring Curl with Anchored Resistance  - 1 x daily - 7 x weekly - 2 sets - 15 reps - 5 seconds hold - Supine Quadriceps Stretch with Strap on Table  - 2 x daily - 7 x weekly - 1 sets - 3 reps - 20-30 seconds hold  ASSESSMENT: CLINICAL IMPRESSION: PT began to incorporate activities to enable her to return to work end of Aug.  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   OBJECTIVE IMPAIRMENTS: Abnormal gait, decreased activity tolerance, decreased balance, decreased endurance, decreased knowledge of condition, decreased knowledge of use of DME, decreased mobility, difficulty walking, decreased ROM, decreased strength, increased edema, and pain.   ACTIVITY LIMITATIONS: carrying, lifting, bending, sitting, standing, squatting, sleeping, stairs, transfers, and locomotion level  PARTICIPATION LIMITATIONS: meal prep, cleaning, driving, community activity, occupation, and recreation  PERSONAL FACTORS: 1-2 comorbidities: see PMH  are also affecting patient's functional outcome.   REHAB POTENTIAL: Good  CLINICAL DECISION MAKING: Stable/uncomplicated  EVALUATION COMPLEXITY: Low   GOALS: Goals reviewed with patient? Yes  SHORT TERM GOALS: (target date for Short term goals 09/02/2022)   1.  Patient will demonstrate independent use of home exercise program to maintain progress from in clinic treatments. Baseline: See objective data Goal status: MET 08/22/2022  2. PROM left knee 0* ext to 115* flexion Baseline: See objective data Goal status: MET 08/22/2022  3. Interim FOTO 55% Baseline: See objective data Goal status: Ongoing 08/22/2022  LONG TERM GOALS: (target dates for all long term goals  09/30/2022 )   1. Patient will demonstrate/report pain at worst less than or equal to 2/10 to facilitate minimal limitation  in daily activity secondary to pain symptoms. Baseline: See objective data Goal status: Ongoing 09/05/2022   2. Patient will demonstrate independent use of home exercise program to facilitate ability to maintain/progress functional gains from skilled physical therapy services. Baseline: See objective data Goal status: Ongoing 09/05/2022   3. Patient will demonstrate FOTO outcome > or = 62% to indicate reduced disability due to condition. Baseline: See objective data Goal status: Ongoing 09/05/2022   4.  Patient will demonstrate left knee MMT 5/5 to faciltiate usual transfers, stairs, squatting at [facility] for daily life.  Baseline: See objective data Goal status: Ongoing 09/05/2022   5.  Patient ambulate >1000' and negotiates ramps, curbs & stairs without device independently.  Baseline: See objective data Goal status: Ongoing 09/05/2022   6.  patient will be able to demonstrate work related tasks including short distance run.  Baseline: See objective data Goal status: Ongoing 09/05/2022    PLAN:  PT FREQUENCY:  2x/week  PT DURATION: 8 weeks  PLANNED INTERVENTIONS: Therapeutic exercises, Therapeutic activity, Neuro Muscular re-education, Balance training, Gait training, Patient/Family education, Joint mobilization, Stair training, DME instructions, Dry Needling, Electrical stimulation, Traction, Cryotherapy, vasopneumatic deviceMoist heat, Taping, Ultrasound, Ionotophoresis 4mg /ml Dexamethasone, and aquatic therapy, Manual therapy.  All included unless contraindicated  PLAN FOR NEXT SESSION: check how updated PT activities did.   Add knee ext & flex machines to program.     Vladimir Faster, PT, DPT 09/05/2022, 11:35 AM

## 2022-09-07 ENCOUNTER — Encounter: Payer: Self-pay | Admitting: Physical Therapy

## 2022-09-07 ENCOUNTER — Ambulatory Visit (INDEPENDENT_AMBULATORY_CARE_PROVIDER_SITE_OTHER): Payer: BC Managed Care – PPO | Admitting: Physical Therapy

## 2022-09-07 DIAGNOSIS — M6281 Muscle weakness (generalized): Secondary | ICD-10-CM

## 2022-09-07 DIAGNOSIS — R600 Localized edema: Secondary | ICD-10-CM

## 2022-09-07 DIAGNOSIS — R2689 Other abnormalities of gait and mobility: Secondary | ICD-10-CM

## 2022-09-07 DIAGNOSIS — M25662 Stiffness of left knee, not elsewhere classified: Secondary | ICD-10-CM

## 2022-09-07 DIAGNOSIS — R2681 Unsteadiness on feet: Secondary | ICD-10-CM

## 2022-09-07 DIAGNOSIS — M25562 Pain in left knee: Secondary | ICD-10-CM | POA: Diagnosis not present

## 2022-09-07 NOTE — Therapy (Signed)
OUTPATIENT PHYSICAL THERAPY TREATMENT NOTE   Patient Name: Anna Glover MRN: [id number] DOB:24-Apr-1975, 47 y.o., female Today's Date: 09/07/2022  END OF SESSION:  PT End of Session - 09/07/22 1013     Visit Number 8    Number of Visits 16    Date for PT Re-Evaluation 09/30/22    Authorization Type [employer] state    Authorization Time Period $52 copay    Progress Note Due on Visit 15    PT Start Time 1013    PT Stop Time 1057    PT Time Calculation (min) 44 min    Activity Tolerance Patient tolerated treatment well;No increased pain    Behavior During Therapy [facility] for tasks assessed/performed                  Past Medical History:  Diagnosis Date   Anxiety    Depression    Hypertension    Hypothyroidism    Osteoarthritis 2012   L knee   Past Surgical History:  Procedure Laterality Date   ADENOIDECTOMY  1991   BIOPSY  01/04/2021   Procedure: BIOPSY;  Surgeon: Tressia Danas, MD;  Location: [facility] ENDOSCOPY;  Service: Gastroenterology;;   CESAREAN SECTION  02/25/2003   CESAREAN SECTION  05/01/2009   CHOLECYSTECTOMY  07/15/2012   ESOPHAGOGASTRODUODENOSCOPY (EGD) WITH PROPOFOL N/A 01/04/2021   Procedure: ESOPHAGOGASTRODUODENOSCOPY (EGD) WITH PROPOFOL;  Surgeon: Tressia Danas, MD;  Location: [facility] ENDOSCOPY;  Service: Gastroenterology;  Laterality: N/A;   TOTAL KNEE ARTHROPLASTY Left 07/18/2022   Procedure: LEFT TOTAL KNEE REPLACEMENT;  Surgeon: Tarry Kos, MD;  Location: [facility] OR;  Service: Orthopedics;  Laterality: Left;   Patient Active Problem List   Diagnosis Date Noted   Chest pain, rule out acute myocardial infarction 07/20/2022   Near syncope 07/20/2022   Status post total left knee replacement 07/18/2022   Gastritis and gastroduodenitis    Acute esophagitis    RUQ pain    Alcohol dependence syndrome (HCC) 01/03/2021   Essential hypertension 01/03/2021   Hypothyroidism (acquired) 01/03/2021   Depression with anxiety 01/03/2021   Hematemesis 01/02/2021    Acute medial meniscus tear of left knee 06/30/2020   Primary osteoarthritis of right knee 03/31/2020   Primary osteoarthritis of left knee 03/31/2020   Headache 06/03/2014   [facility] spotted fever 06/03/2014   Thrombocytopenia (HCC) 06/03/2014   Elevated LFTs 06/03/2014    PCP: Courtney Paris, NP  REFERRING PROVIDER: Virgia Land. Dorise Hiss  REFERRING DIAG: [phone] (ICD-10-CM) - S/P total knee arthroplasty, left   THERAPY DIAG:  Stiffness of left knee, not elsewhere classified  Muscle weakness (generalized)  Acute pain of left knee  Localized edema  Other abnormalities of gait and mobility  Unsteadiness on feet  Rationale for Evaluation and Treatment: Rehabilitation  ONSET DATE: 07/18/2022 L TKA   SUBJECTIVE:   SUBJECTIVE STATEMENT: She had soreness in muscles with trouble sleeping the first night.     PERTINENT HISTORY: TKA left 07/18/22, Anxiety, depression, HTN, OA, 2016 [facility] Spotted Fever with no ongoing  PAIN:  NPRS scale: today 0/10 sitting 1-2/10 walking 3/10 exercising more muscle soreness than joint pain. Pain location: left knee medial & lateral joint line Pain description: sharp & pulling Aggravating factors: stretches, standing or walking >15 min Relieving factors: sit down, tylenol & at night muscle relaxor, elevation, ice  PRECAUTIONS: Fall  WEIGHT BEARING RESTRICTIONS: Yes LLE WBAT  FALLS:  Has patient fallen in last 6 months? 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 4 cm, below knee 42.4 cm RLE: above knee 48.4cm,  around knee 43.2 cm, below knee 39.4 cm  POSTURE: weight shift right  PALPATION: Tenderness along joint line and incision  LOWER EXTREMITY ROM:   ROM Right eval Left eval Left 08/11/22 Left 08/15/22 Left 08/22/22  Hip flexion       Hip extension       Hip abduction       Hip adduction       Hip internal rotation       Hip external rotation       Knee flexion  Seated A: 98* P: 104* Supine AAROM 115 deg mild pain A: 111 deg no pain, supine Supine A: 111* P: 114*  Knee extension  Seated A: quad set -6* LAQ -9* P: -4*   Seated LAQ -3* Supine P: 0*  Ankle dorsiflexion       Ankle plantarflexion       Ankle inversion       Ankle eversion        (Blank rows = not tested)  LOWER EXTREMITY MMT:  MMT Left eval Right 08/22/22 Left 08/22/22  Hip flexion     Hip extension     Hip abduction     Hip adduction     Hip internal rotation     Hip external rotation     Knee flexion 3-/5    Knee extension 3-/5 HH dynameter 89.8# HH dynameter 55# 51.8# LLE:RLE 59%  Ankle dorsiflexion     Ankle plantarflexion     Ankle inversion     Ankle eversion      (Blank rows = not tested)  FUNCTIONAL TESTS:  18 inch chair transfer: 5X sit to stand 17.06 sec Lt SLS: 23 sec Rt SLS: 17 sec  GAIT: Distance walked: 200' Assistive device utilized: None Level of assistance: SBA cues only for deviations Comments: antalgic with decreased LLE stance, left knee flexed in stance,  decreased left knee flexion in swing   TODAY'S TREATMENT                                  DATE:  09/07/2022 Therapeutic Exercise: Recumbent bike seat 9 level 4 for 8 min Side step squat with green T-band around knees 10 reps to right & to left. Seated long sitting V hamstring stretch 1 rep centered, 1 rep over RLE & 1 rep over LLE 20 sec holds Seated piriformis stretch 20 sec hold  1 reps Supine SLR with strap adduction ITB stretch 20 sec hold 2 reps  Neuromuscular Re-education: Slider to 3 cones (ant-lat, lat & post-lat) with stance knee flexion on outward motion and extension on inward motion 5 reps BLEs.  Therapeutic Activities: Functional squat with green T-band figure 8 around knees 5 reps with cues for midline Lunge kneel bw 2 chairs with intermittent light touch BUE onto pillow 10 reps ea LE.  Kneeling on foam pad with BUE on chair seat for transition. Then to floor for cross-legged sitting like what she needs in school.   Quick side step to 4 pods 30 sec 3 bouts (11 hits, 12 hits & 1 hits) with 60 sec rest bw. Forward / backward suicide lunge to 4 pods 4' apart.   3 reps    TREATMENT                                  DATE:  09/05/2022: Therapeutic Exercise: Recumbent bike seat 9 level 4 for 6 min Side step squat with green T-band around knees 10 reps to right & to left.   Neuromuscular Re-education: Slider to 3 cones (ant-lat, lat & post-lat) with stance knee flexion on outward motion and extension on inward motion 5 reps BLEs.  Therapeutic Activities: Functional squat 5 reps with cues for midline Kneeling in chair -stand to tall knee leading with RLE & off lead with LLE 10 reps.  Progressed to reaching single UE & BUEs overhead for 2 min.  Lunge kneel bw 2 chairs with BUE support onto pillow 10 reps ea LE.  Quick side step to 4 pods 30 sec 3 bouts (8 hits, 10 & 11) with 15 sec rest bw. Forward / backward suicide lunge to 4 pods 3' apart.  1st attempt 18.62sec, 2nd 16.25sec &  3rd 16.56sec.  PT recommended standing for 20 min of each work hour during day to build stamina for return to work. When 20 min per hour for 8 hours is not challenging, then try 30 min ea hour. Then 40 min & 45 min. Pt verbalized understanding including rationale.    TREATMENT                                  DATE:  08/24/2022: Therapeutic Exercise: Recumbent bike seat 9 level 4 for 8 min Plank with sink then alternate LEs touching knee to counter with push-off stance LE 10 reps ea. Seated hamstring curl blue T-band LLE 15 reps Standing LLE TKE blue theraband 10 reps 1st set only TKE, 2nd set LLE TKE tapping RLE on cone Leg press BLEs 100# 15 reps 1 sets, Pt request for instruction in use of recumbent stepper for [employer]. PT demo & verbal cues on set-up and moving seat forward for increased flexion. Nustep level 7 BLEs & BUEs 3 min for instruction. Pt verbalized and return demo understanding.    Neuromuscular Re-education: Slider to 3 cones (ant-lat, lat & post-lat) with stance knee flexion on outward motion and extension on inward motion 5 reps BLEs.  Therapeutic Activities: Stairs alternating pattern single rail 11 steps 2 sets PT demo & verbal cues on technique Functional squat 5 reps with cues for midline Amb 1' carrying 10# in RUE & then 52' in LUE   Vaso Left knee medium compression 34* 10 min with elevation     PATIENT  EDUCATION:  Education details: rationale for interventions, HEP Person educated: Patient Education method: Programmer, multimedia, Demonstration, Verbal cues, and Handouts Education comprehension: verbalized understanding, returned demonstration, and verbal cues required  HOME EXERCISE PROGRAM: Access Code: [email or web address] URL: [email or web address] Date: 08/22/2022 Prepared by: Vladimir Faster  Exercises - Supine Straight Leg Raises  - 2-3 x daily - 7 x weekly - 2-3 sets - 10 reps - 5 seconds hold - Seated straight leg lifts  - 2-3 x daily - 7 x weekly - 2-3  sets - 10 reps - 5 seconds hold - Seated Hamstring Stretch with Strap  - 2-4 x daily - 7 x weekly - 2-3 sets - 3 reps - 20-30 seconds hold - Seated Long Arc Quad  - 2-4 x daily - 7 x weekly - 2-3 sets - 10 reps - 5 seconds hold - Gastroc Stretch on Step  - 2 x daily - 7 x weekly - 1 sets - 3 reps - 20-30 seconds hold - Standing Eccentric Heel Raise  - 1 x daily - 7 x weekly - 2 sets - 15 reps - 5 seconds hold - squat over chair like "dirty toilet"  - 1 x daily - 7 x weekly - 2 sets - 15 reps - 5 seconds hold - Seated Hamstring Curl with Anchored Resistance  - 1 x daily - 7 x weekly - 2 sets - 15 reps - 5 seconds hold - Supine Quadriceps Stretch with Strap on Table  - 2 x daily - 7 x weekly - 1 sets - 3 reps - 20-30 seconds hold  ASSESSMENT: CLINICAL IMPRESSION: Patient had some muscle soreness after PT exercises last session from using muscles in new way.  She improved motions in PT today.    OBJECTIVE IMPAIRMENTS: Abnormal gait, decreased activity tolerance, decreased balance, decreased endurance, decreased knowledge of condition, decreased knowledge of use of DME, decreased mobility, difficulty walking, decreased ROM, decreased strength, increased edema, and pain.   ACTIVITY LIMITATIONS: carrying, lifting, bending, sitting, standing, squatting, sleeping, stairs, transfers, and locomotion level  PARTICIPATION LIMITATIONS: meal prep, cleaning, driving, community activity, occupation, and recreation  PERSONAL FACTORS: 1-2 comorbidities: see PMH  are also affecting patient's functional outcome.   REHAB POTENTIAL: Good  CLINICAL DECISION MAKING: Stable/uncomplicated  EVALUATION COMPLEXITY: Low   GOALS: Goals reviewed with patient? Yes  SHORT TERM GOALS: (target date for Short term goals 09/02/2022)   1.  Patient will demonstrate independent use of home exercise program to maintain progress from in clinic treatments. Baseline: See objective data Goal status: MET 08/22/2022  2. PROM left  knee 0* ext to 115* flexion Baseline: See objective data Goal status: MET 08/22/2022  3. Interim FOTO 55% Baseline: See objective data Goal status: MET 09/05/2022  LONG TERM GOALS: (target dates for all long term goals  09/30/2022 )   1. Patient will demonstrate/report pain at worst less than or equal to 2/10 to facilitate minimal limitation in daily activity secondary to pain symptoms. Baseline: See objective data Goal status: Ongoing 09/05/2022   2. Patient will demonstrate independent use of home exercise program to facilitate ability to maintain/progress functional gains from skilled physical therapy services. Baseline: See objective data Goal status: Ongoing 09/05/2022   3. Patient will demonstrate FOTO outcome > or = 62% to indicate reduced disability due to condition. Baseline: See objective data Goal status: MET 09/05/2022   4.  Patient will demonstrate left knee MMT 5/5 to faciltiate usual transfers, stairs, squatting at [facility] for daily life.  Baseline:  See objective data Goal status: Ongoing 09/05/2022   5.  Patient ambulate >1000' and negotiates ramps, curbs & stairs without device independently.  Baseline: See objective data Goal status: Ongoing 09/05/2022   6.  patient will be able to demonstrate work related tasks including short distance run.  Baseline: See objective data Goal status: Ongoing 09/05/2022    PLAN:  PT FREQUENCY:  2x/week  PT DURATION: 8 weeks  PLANNED INTERVENTIONS: Therapeutic exercises, Therapeutic activity, Neuro Muscular re-education, Balance training, Gait training, Patient/Family education, Joint mobilization, Stair training, DME instructions, Dry Needling, Electrical stimulation, Traction, Cryotherapy, vasopneumatic deviceMoist heat, Taping, Ultrasound, Ionotophoresis 4mg /ml Dexamethasone, and aquatic therapy, Manual therapy.  All included unless contraindicated  PLAN FOR NEXT SESSION:  continue with functional activities to return to work  activities. Continue agility with pods.   Add knee ext & flex machines to program.     Vladimir Faster, PT, DPT 09/07/2022, 11:07 AM

## 2022-09-13 ENCOUNTER — Ambulatory Visit: Payer: BC Managed Care – PPO | Admitting: Rehabilitative and Restorative Service Providers"

## 2022-09-13 ENCOUNTER — Encounter: Payer: Self-pay | Admitting: Rehabilitative and Restorative Service Providers"

## 2022-09-13 DIAGNOSIS — M6281 Muscle weakness (generalized): Secondary | ICD-10-CM

## 2022-09-13 DIAGNOSIS — M25562 Pain in left knee: Secondary | ICD-10-CM | POA: Diagnosis not present

## 2022-09-13 DIAGNOSIS — M25662 Stiffness of left knee, not elsewhere classified: Secondary | ICD-10-CM | POA: Diagnosis not present

## 2022-09-13 DIAGNOSIS — R600 Localized edema: Secondary | ICD-10-CM

## 2022-09-13 DIAGNOSIS — R2681 Unsteadiness on feet: Secondary | ICD-10-CM

## 2022-09-13 DIAGNOSIS — R2689 Other abnormalities of gait and mobility: Secondary | ICD-10-CM

## 2022-09-13 NOTE — Therapy (Signed)
OUTPATIENT PHYSICAL THERAPY TREATMENT NOTE   Patient Name: Anna Glover MRN: [id number] DOB:1975-04-14, 47 y.o., female Today's Date: 09/13/2022  END OF SESSION:  PT End of Session - 09/13/22 0813     Visit Number 9    Number of Visits 16    Date for PT Re-Evaluation 09/30/22    Authorization Type [employer] state    Authorization Time Period $52 copay    Progress Note Due on Visit 15    PT Start Time 0759    PT Stop Time 0840    PT Time Calculation (min) 41 min    Activity Tolerance Patient tolerated treatment well    Behavior During Therapy [facility] for tasks assessed/performed                   Past Medical History:  Diagnosis Date   Anxiety    Depression    Hypertension    Hypothyroidism    Osteoarthritis 2012   L knee   Past Surgical History:  Procedure Laterality Date   ADENOIDECTOMY  1991   BIOPSY  01/04/2021   Procedure: BIOPSY;  Surgeon: Tressia Danas, MD;  Location: [facility] ENDOSCOPY;  Service: Gastroenterology;;   CESAREAN SECTION  02/25/2003   CESAREAN SECTION  05/01/2009   CHOLECYSTECTOMY  07/15/2012   ESOPHAGOGASTRODUODENOSCOPY (EGD) WITH PROPOFOL N/A 01/04/2021   Procedure: ESOPHAGOGASTRODUODENOSCOPY (EGD) WITH PROPOFOL;  Surgeon: Tressia Danas, MD;  Location: [facility] ENDOSCOPY;  Service: Gastroenterology;  Laterality: N/A;   TOTAL KNEE ARTHROPLASTY Left 07/18/2022   Procedure: LEFT TOTAL KNEE REPLACEMENT;  Surgeon: Tarry Kos, MD;  Location: [facility] OR;  Service: Orthopedics;  Laterality: Left;   Patient Active Problem List   Diagnosis Date Noted   Chest pain, rule out acute myocardial infarction 07/20/2022   Near syncope 07/20/2022   Status post total left knee replacement 07/18/2022   Gastritis and gastroduodenitis    Acute esophagitis    RUQ pain    Alcohol dependence syndrome (HCC) 01/03/2021   Essential hypertension 01/03/2021   Hypothyroidism (acquired) 01/03/2021   Depression with anxiety 01/03/2021   Hematemesis 01/02/2021   Acute medial  meniscus tear of left knee 06/30/2020   Primary osteoarthritis of right knee 03/31/2020   Primary osteoarthritis of left knee 03/31/2020   Headache 06/03/2014   [facility] spotted fever 06/03/2014   Thrombocytopenia (HCC) 06/03/2014   Elevated LFTs 06/03/2014    PCP: Courtney Paris, NP  REFERRING PROVIDER: Virgia Land. Dorise Hiss  REFERRING DIAG: [id number] (ICD-10-CM) - S/P total knee arthroplasty, left   THERAPY DIAG:  Stiffness of left knee, not elsewhere classified  Muscle weakness (generalized)  Acute pain of left knee  Localized edema  Other abnormalities of gait and mobility  Unsteadiness on feet  Rationale for Evaluation and Treatment: Rehabilitation  ONSET DATE: 07/18/2022 L TKA   SUBJECTIVE:   SUBJECTIVE STATEMENT: She indicated no specific pain to report and felt better after the last visit.   PERTINENT HISTORY: TKA left 07/18/22, Anxiety, depression, HTN, OA, 2016 [facility] Spotted Fever with no ongoing  PAIN:  NPRS scale: today 0/10 sitting 1-2/10 walking 3/10 exercising more muscle soreness than joint pain. Pain location: left knee medial & lateral joint line Pain description: sharp & pulling Aggravating factors: stretches, standing or walking >15 min Relieving factors: sit down, tylenol & at night muscle relaxor, elevation, ice  PRECAUTIONS: Fall  WEIGHT BEARING RESTRICTIONS: Yes LLE WBAT  FALLS:  Has patient fallen in last 6 months? 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 4 cm, below knee 42.4 cm RLE: above knee 48.4cm,  around knee 43.2 cm, below knee 39.4 cm  POSTURE: 08/08/2022 weight shift right  PALPATION: 08/08/2022 Tenderness along joint line and incision  LOWER EXTREMITY ROM:   ROM Right 08/08/2022 Left 08/08/2022 Left 08/11/22 Left 08/15/22 Left 08/22/22  Hip flexion       Hip extension       Hip abduction       Hip adduction       Hip internal rotation       Hip external rotation       Knee flexion  Seated A: 98* P: 104* Supine AAROM 115 deg mild pain A: 111 deg no pain, supine Supine A: 111* P: 114*  Knee extension  Seated A: quad set -6* LAQ -9* P: -4*   Seated LAQ -3* Supine P: 0*  Ankle dorsiflexion       Ankle plantarflexion       Ankle inversion       Ankle eversion        (Blank rows = not tested)  LOWER EXTREMITY MMT:  MMT Left 08/08/2022 Right 08/22/22 Left 08/22/22  Hip flexion     Hip extension     Hip abduction     Hip adduction     Hip internal rotation     Hip external rotation     Knee flexion 3-/5    Knee extension 3-/5 HH dynameter 89.8# HH dynameter 55# 51.8# LLE:RLE 59%  Ankle dorsiflexion     Ankle plantarflexion     Ankle inversion     Ankle eversion      (Blank rows = not tested)  FUNCTIONAL TESTS: 09/13/2022:  5 x sit to stand:  11.09 seconds   08/08/2022: 18 inch chair transfer: 5X sit to stand 17.06 sec Lt SLS: 23 sec Rt SLS: 17 sec  GAIT: 08/08/2022 Distance walked: 200' Assistive device  utilized: None Level of assistance: SBA cues only for deviations Comments: antalgic with decreased LLE stance, left knee flexed in stance, decreased left knee flexion in swing                    TODAY'S TREATMENT                                  DATE:  09/07/2022 Therapeutic Exercise: Recumbent bike seat 9 level 4 for 9 min TRX supported squat over chair 2  x 15 Lateral stepping green band around knees  20 ft x 3 each way  Knee extension machine double leg up, Lt leg lowering 10 lbs 2 x 10   Neuromuscular Re-education: SLS with vector reaching light touch contralateral leg fwd, lateral, back x 8 each, performed bilaterally with occasional HHA Lateral stepping blazepod reactive lights 3 laterally 30 seconds x 2 bilateral with 15 sec rest periods.  2.5 second timeout on lights.     TODAY'S TREATMENT                                  DATE:  09/07/2022 Therapeutic Exercise: Recumbent bike seat 9 level 4 for 8 min Side step squat with green T-band around knees 10 reps to right & to left. Seated long sitting V hamstring stretch 1 rep centered, 1 rep over RLE & 1 rep over LLE 20 sec holds Seated piriformis stretch 20 sec hold 1 reps Supine SLR with strap adduction ITB stretch 20 sec hold 2 reps  Neuromuscular Re-education: Slider to 3 cones (ant-lat, lat & post-lat) with stance knee flexion on outward motion and extension on inward motion 5 reps BLEs.  Therapeutic Activities: Functional squat with green T-band figure 8 around knees 5 reps with cues for midline Lunge kneel bw 2 chairs with intermittent light touch BUE onto pillow 10 reps ea LE.  Kneeling on foam pad with BUE on chair seat for transition. Then to floor for cross-legged sitting like what she needs in school.   Quick side step to 4 pods 30 sec 3 bouts (11 hits, 12 hits & 1 hits) with 60 sec rest bw. Forward / backward suicide lunge to 4 pods 4' apart.   3 reps    TREATMENT                                  DATE:   09/05/2022: Therapeutic Exercise: Recumbent bike seat 9 level 4 for 6 min Side step squat with green T-band around knees 10 reps to right & to left.   Neuromuscular Re-education: Slider to 3 cones (ant-lat, lat & post-lat) with stance knee flexion on outward motion and extension on inward motion 5 reps BLEs.  Therapeutic Activities: Functional squat 5 reps with cues for midline Kneeling in chair -stand to tall knee leading with RLE & off lead with LLE 10 reps.  Progressed to reaching single UE & BUEs overhead for 2 min.  Lunge kneel bw 2 chairs with BUE support onto pillow 10 reps ea LE.  Quick side step to 4 pods 30 sec 3 bouts (8 hits, 10 & 11) with 15 sec rest bw. Forward / backward suicide lunge to 4 pods 3' apart.  1st attempt 18.62sec, 2nd 16.25sec & 3rd 16.56sec.  PT recommended standing for 20 min of each work hour during day to build stamina for return to work. When 20 min per hour for 8 hours is not challenging, then try 30 min ea hour. Then 40 min & 45 min. Pt verbalized understanding including rationale.     PATIENT EDUCATION:  Education details: rationale for interventions, HEP Person educated: Patient Education method: Programmer, multimedia, Demonstration, Verbal cues, and Handouts Education comprehension: verbalized understanding, returned demonstration, and verbal cues required  HOME EXERCISE PROGRAM: Access Code: [email or web address] URL: [email or web address] Date: 08/22/2022 Prepared by: Vladimir Faster  Exercises - Supine Straight Leg Raises  -  2-3 x daily - 7 x weekly - 2-3 sets - 10 reps - 5 seconds hold - Seated straight leg lifts  - 2-3 x daily - 7 x weekly - 2-3 sets - 10 reps - 5 seconds hold - Seated Hamstring Stretch with Strap  - 2-4 x daily - 7 x weekly - 2-3 sets - 3 reps - 20-30 seconds hold - Seated Long Arc Quad  - 2-4 x daily - 7 x weekly - 2-3 sets - 10 reps - 5 seconds hold - Gastroc Stretch on Step  - 2 x daily - 7 x weekly - 1 sets - 3 reps - 20-30  seconds hold - Standing Eccentric Heel Raise  - 1 x daily - 7 x weekly - 2 sets - 15 reps - 5 seconds hold - squat over chair like "dirty toilet"  - 1 x daily - 7 x weekly - 2 sets - 15 reps - 5 seconds hold - Seated Hamstring Curl with Anchored Resistance  - 1 x daily - 7 x weekly - 2 sets - 15 reps - 5 seconds hold - Supine Quadriceps Stretch with Strap on Table  - 2 x daily - 7 x weekly - 1 sets - 3 reps - 20-30 seconds hold  ASSESSMENT: CLINICAL IMPRESSION: Pt continued to show benefit from progressive strengthening and movement coordination intervention with good tolerance related to after effect symptoms.  Plan to continue to improve agility and compliant surface balance improvements.   OBJECTIVE IMPAIRMENTS: Abnormal gait, decreased activity tolerance, decreased balance, decreased endurance, decreased knowledge of condition, decreased knowledge of use of DME, decreased mobility, difficulty walking, decreased ROM, decreased strength, increased edema, and pain.   ACTIVITY LIMITATIONS: carrying, lifting, bending, sitting, standing, squatting, sleeping, stairs, transfers, and locomotion level  PARTICIPATION LIMITATIONS: meal prep, cleaning, driving, community activity, occupation, and recreation  PERSONAL FACTORS: 1-2 comorbidities: see PMH  are also affecting patient's functional outcome.   REHAB POTENTIAL: Good  CLINICAL DECISION MAKING: Stable/uncomplicated  EVALUATION COMPLEXITY: Low   GOALS: Goals reviewed with patient? Yes  SHORT TERM GOALS: (target date for Short term goals 09/02/2022)   1.  Patient will demonstrate independent use of home exercise program to maintain progress from in clinic treatments. Baseline: See objective data Goal status: MET 08/22/2022  2. PROM left knee 0* ext to 115* flexion Baseline: See objective data Goal status: MET 08/22/2022  3. Interim FOTO 55% Baseline: See objective data Goal status: MET 09/05/2022  LONG TERM GOALS: (target dates for  all long term goals  09/30/2022 )   1. Patient will demonstrate/report pain at worst less than or equal to 2/10 to facilitate minimal limitation in daily activity secondary to pain symptoms. Baseline: See objective data Goal status: Ongoing 09/05/2022   2. Patient will demonstrate independent use of home exercise program to facilitate ability to maintain/progress functional gains from skilled physical therapy services. Baseline: See objective data Goal status: Ongoing 09/05/2022   3. Patient will demonstrate FOTO outcome > or = 62% to indicate reduced disability due to condition. Baseline: See objective data Goal status: MET 09/05/2022   4.  Patient will demonstrate left knee MMT 5/5 to faciltiate usual transfers, stairs, squatting at [facility] for daily life.  Baseline: See objective data Goal status: Ongoing 09/05/2022   5.  Patient ambulate >1000' and negotiates ramps, curbs & stairs without device independently.  Baseline: See objective data Goal status: Ongoing 09/05/2022   6.  patient will be able to demonstrate work related tasks  including short distance run.  Baseline: See objective data Goal status: Ongoing 09/05/2022    PLAN:  PT FREQUENCY:  2x/week  PT DURATION: 8 weeks  PLANNED INTERVENTIONS: Therapeutic exercises, Therapeutic activity, Neuro Muscular re-education, Balance training, Gait training, Patient/Family education, Joint mobilization, Stair training, DME instructions, Dry Needling, Electrical stimulation, Traction, Cryotherapy, vasopneumatic deviceMoist heat, Taping, Ultrasound, Ionotophoresis 4mg /ml Dexamethasone, and aquatic therapy, Manual therapy.  All included unless contraindicated  PLAN FOR NEXT SESSION:  Progressive strengthening continued in WB.  Improve compliant surface and dynamic balance.    Chyrel Masson, PT, DPT, OCS, ATC 09/13/22  8:39 AM

## 2022-09-15 ENCOUNTER — Ambulatory Visit: Payer: BC Managed Care – PPO | Admitting: Physical Therapy

## 2022-09-15 ENCOUNTER — Encounter: Payer: Self-pay | Admitting: Physical Therapy

## 2022-09-15 DIAGNOSIS — M6281 Muscle weakness (generalized): Secondary | ICD-10-CM | POA: Diagnosis not present

## 2022-09-15 DIAGNOSIS — R600 Localized edema: Secondary | ICD-10-CM | POA: Diagnosis not present

## 2022-09-15 DIAGNOSIS — M25562 Pain in left knee: Secondary | ICD-10-CM

## 2022-09-15 DIAGNOSIS — M25662 Stiffness of left knee, not elsewhere classified: Secondary | ICD-10-CM

## 2022-09-15 DIAGNOSIS — R2689 Other abnormalities of gait and mobility: Secondary | ICD-10-CM

## 2022-09-15 DIAGNOSIS — R2681 Unsteadiness on feet: Secondary | ICD-10-CM

## 2022-09-15 NOTE — Therapy (Signed)
OUTPATIENT PHYSICAL THERAPY TREATMENT NOTE   Patient Name: Anna Glover MRN: [id number] DOB:11-03-1975, 47 y.o., female Today's Date: 09/15/2022  END OF SESSION:  PT End of Session - 09/15/22 1304     Visit Number 10    Number of Visits 16    Date for PT Re-Evaluation 09/30/22    Authorization Type [employer] state    Authorization Time Period $52 copay    Progress Note Due on Visit 15    PT Start Time 1301    PT Stop Time 1349    PT Time Calculation (min) 48 min    Activity Tolerance Patient tolerated treatment well    Behavior During Therapy [facility] for tasks assessed/performed                    Past Medical History:  Diagnosis Date   Anxiety    Depression    Hypertension    Hypothyroidism    Osteoarthritis 2012   L knee   Past Surgical History:  Procedure Laterality Date   ADENOIDECTOMY  1991   BIOPSY  01/04/2021   Procedure: BIOPSY;  Surgeon: Tressia Danas, MD;  Location: [facility] ENDOSCOPY;  Service: Gastroenterology;;   CESAREAN SECTION  02/25/2003   CESAREAN SECTION  05/01/2009   CHOLECYSTECTOMY  07/15/2012   ESOPHAGOGASTRODUODENOSCOPY (EGD) WITH PROPOFOL N/A 01/04/2021   Procedure: ESOPHAGOGASTRODUODENOSCOPY (EGD) WITH PROPOFOL;  Surgeon: Tressia Danas, MD;  Location: [facility] ENDOSCOPY;  Service: Gastroenterology;  Laterality: N/A;   TOTAL KNEE ARTHROPLASTY Left 07/18/2022   Procedure: LEFT TOTAL KNEE REPLACEMENT;  Surgeon: Tarry Kos, MD;  Location: [facility] OR;  Service: Orthopedics;  Laterality: Left;   Patient Active Problem List   Diagnosis Date Noted   Chest pain, rule out acute myocardial infarction 07/20/2022   Near syncope 07/20/2022   Status post total left knee replacement 07/18/2022   Gastritis and gastroduodenitis    Acute esophagitis    RUQ pain    Alcohol dependence syndrome (HCC) 01/03/2021   Essential hypertension 01/03/2021   Hypothyroidism (acquired) 01/03/2021   Depression with anxiety 01/03/2021   Hematemesis 01/02/2021   Acute  medial meniscus tear of left knee 06/30/2020   Primary osteoarthritis of right knee 03/31/2020   Primary osteoarthritis of left knee 03/31/2020   Headache 06/03/2014   [facility] spotted fever 06/03/2014   Thrombocytopenia (HCC) 06/03/2014   Elevated LFTs 06/03/2014    PCP: Courtney Paris, NP  REFERRING PROVIDER: Virgia Land. Dorise Hiss  REFERRING DIAG: [phone] (ICD-10-CM) - S/P total knee arthroplasty, left   THERAPY DIAG:  Stiffness of left knee, not elsewhere classified  Muscle weakness (generalized)  Acute pain of left knee  Localized edema  Other abnormalities of gait and mobility  Unsteadiness on feet  Rationale for Evaluation and Treatment: Rehabilitation  ONSET DATE: 07/18/2022 L TKA   SUBJECTIVE:   SUBJECTIVE STATEMENT: Her knee is doing better. She can do more work activities and knee feels good. Standing still like in line makes her knee feel stiff.   PERTINENT HISTORY: TKA left 07/18/22, Anxiety, depression, HTN, OA, 2016 [facility] Spotted Fever with no ongoing  PAIN:  NPRS scale: today 0/10 sitting 0/10 walking 0/10 exercising more muscle soreness than joint pain. Pain location: left knee medial & lateral joint line Pain description: sharp & pulling Aggravating factors: stretches, standing or walking >15 min Relieving factors: sit down, tylenol & at night muscle relaxor, elevation, ice  PRECAUTIONS: Fall  WEIGHT BEARING RESTRICTIONS: Yes LLE WBAT  FALLS:  Has patient  fallen in last 6 months? 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 4 cm, below knee 42.4 cm RLE: above knee 48.4cm,  around knee 43.2 cm, below knee 39.4 cm  POSTURE: 08/08/2022 weight shift right  PALPATION: 08/08/2022 Tenderness along joint line and incision  LOWER EXTREMITY ROM:   ROM Right 08/08/2022 Left 08/08/2022 Left 08/11/22 Left 08/15/22 Left 08/22/22  Hip flexion       Hip extension       Hip abduction       Hip adduction       Hip internal rotation       Hip external rotation       Knee flexion  Seated A: 98* P: 104* Supine AAROM 115 deg mild pain A: 111 deg no pain, supine Supine A: 111* P: 114*  Knee extension  Seated A: quad set -6* LAQ -9* P: -4*   Seated LAQ -3* Supine P: 0*  Ankle dorsiflexion       Ankle plantarflexion       Ankle inversion       Ankle eversion        (Blank rows = not tested)  LOWER EXTREMITY MMT:  MMT Left 08/08/2022 Right 08/22/22 Left 08/22/22  Hip flexion     Hip extension     Hip abduction     Hip adduction     Hip internal rotation     Hip external rotation     Knee flexion 3-/5    Knee extension 3-/5 HH dynameter 89.8# HH dynameter 55# 51.8# LLE:RLE 59%  Ankle dorsiflexion     Ankle plantarflexion     Ankle inversion     Ankle eversion      (Blank rows = not tested)  FUNCTIONAL TESTS: 09/13/2022:  5 x sit to stand:  11.09 seconds   08/08/2022: 18 inch chair transfer: 5X sit to stand 17.06 sec Lt SLS: 23 sec Rt SLS: 17  sec  GAIT: 08/08/2022 Distance walked: 200' Assistive device utilized: None Level of assistance: SBA cues only for deviations Comments: antalgic with decreased LLE stance, left knee flexed in stance, decreased left knee flexion in swing                    TODAY'S TREATMENT                                  DATE: 09/15/2022 Therapeutic Exercise: Recumbent bike seat 9  level 4 for 8 min LLE Step up, over & down on BOSU round side up without UE support 15 reps LLE Lateral step up with RLE reaching across LLE ant with knee ext & post with slight knee flexion on BOSU round side up with light BUE support 15 reps TRX supported squat over chair 15 reps  Leg press with back flat BLEs 100# 10 reps; BLE jump 75# 15 reps; single leg jump catching with opposite LE 50# 15 reps Lateral stepping squat blue band around knees  20 ft x 3 each way  Knee extension machine double leg up, Lt leg lowering 10 lbs 2 x 10   Neuromuscular Re-education: SLS on foam with vector reaching light touch contralateral leg fwd, lateral, back x 8 each, performed bilaterally with occasional HHA Lateral stepping blazepod reactive lights 3 laterally 30 seconds x 2 bilateral with 15 sec rest periods.  2.5 second timeout on lights.    TREATMENT                                  DATE:  09/07/2022 Therapeutic Exercise: Recumbent bike seat 9 level 4 for 9 min TRX supported squat over chair 2 x 15 Lateral stepping green band around knees  20 ft x 3 each way  Knee extension machine double leg up, Lt leg lowering 10 lbs 2 x 10   Neuromuscular Re-education: SLS with vector reaching light touch contralateral leg fwd, lateral, back x 8 each, performed bilaterally with occasional HHA Lateral stepping blazepod reactive lights 3 laterally 30 seconds x 2 bilateral with 15 sec rest periods.  2.5 second timeout on lights.    TREATMENT                                  DATE:  09/07/2022 Therapeutic Exercise: Recumbent bike seat 9 level 4 for  8 min Side step squat with green T-band around knees 10 reps to right & to left. Seated long sitting V hamstring stretch 1 rep centered, 1 rep over RLE & 1 rep over LLE 20 sec holds Seated piriformis stretch 20 sec hold 1 reps Supine SLR with strap adduction ITB stretch 20 sec hold 2 reps  Neuromuscular Re-education: Slider to 3 cones (ant-lat, lat & post-lat) with stance knee flexion on outward motion and extension on inward motion 5 reps BLEs.  Therapeutic Activities: Functional squat with green T-band figure 8 around knees 5 reps with cues for midline Lunge kneel bw 2 chairs with intermittent light touch BUE onto pillow 10 reps ea LE.  Kneeling on foam pad with BUE on chair seat for transition. Then to floor for cross-legged sitting like what she needs in school.   Quick side step to 4 pods 30 sec 3 bouts (11 hits, 12 hits & 1 hits) with 60 sec rest bw. Forward / backward suicide lunge to 4 pods 4' apart.   3 reps    PATIENT EDUCATION:  Education details: rationale for interventions, HEP Person educated: Patient Education method: Explanation, Demonstration, Verbal cues, and Handouts Education comprehension: verbalized understanding, returned demonstration, and verbal cues required  HOME EXERCISE PROGRAM: Access Code: [email or web address] URL: [email or web address] Date: 08/22/2022 Prepared by: Vladimir Faster  Exercises - Supine Straight Leg Raises  - 2-3 x daily - 7 x weekly - 2-3 sets - 10 reps - 5 seconds hold - Seated straight  leg lifts  - 2-3 x daily - 7 x weekly - 2-3 sets - 10 reps - 5 seconds hold - Seated Hamstring Stretch with Strap  - 2-4 x daily - 7 x weekly - 2-3 sets - 3 reps - 20-30 seconds hold - Seated Long Arc Quad  - 2-4 x daily - 7 x weekly - 2-3 sets - 10 reps - 5 seconds hold - Gastroc Stretch on Step  - 2 x daily - 7 x weekly - 1 sets - 3 reps - 20-30 seconds hold - Standing Eccentric Heel Raise  - 1 x daily - 7 x weekly - 2 sets - 15 reps - 5 seconds  hold - squat over chair like "dirty toilet"  - 1 x daily - 7 x weekly - 2 sets - 15 reps - 5 seconds hold - Seated Hamstring Curl with Anchored Resistance  - 1 x daily - 7 x weekly - 2 sets - 15 reps - 5 seconds hold - Supine Quadriceps Stretch with Strap on Table  - 2 x daily - 7 x weekly - 1 sets - 3 reps - 20-30 seconds hold  ASSESSMENT: CLINICAL IMPRESSION: PT added jump using leg press machine today which she tolerated well.  She continues to improve knee control with agility exercises.  Plan to continue to improve agility and compliant surface balance improvements.   OBJECTIVE IMPAIRMENTS: Abnormal gait, decreased activity tolerance, decreased balance, decreased endurance, decreased knowledge of condition, decreased knowledge of use of DME, decreased mobility, difficulty walking, decreased ROM, decreased strength, increased edema, and pain.   ACTIVITY LIMITATIONS: carrying, lifting, bending, sitting, standing, squatting, sleeping, stairs, transfers, and locomotion level  PARTICIPATION LIMITATIONS: meal prep, cleaning, driving, community activity, occupation, and recreation  PERSONAL FACTORS: 1-2 comorbidities: see PMH  are also affecting patient's functional outcome.   REHAB POTENTIAL: Good  CLINICAL DECISION MAKING: Stable/uncomplicated  EVALUATION COMPLEXITY: Low   GOALS: Goals reviewed with patient? Yes  SHORT TERM GOALS: (target date for Short term goals 09/02/2022)   1.  Patient will demonstrate independent use of home exercise program to maintain progress from in clinic treatments. Baseline: See objective data Goal status: MET 08/22/2022  2. PROM left knee 0* ext to 115* flexion Baseline: See objective data Goal status: MET 08/22/2022  3. Interim FOTO 55% Baseline: See objective data Goal status: MET 09/05/2022  LONG TERM GOALS: (target dates for all long term goals  09/30/2022 )   1. Patient will demonstrate/report pain at worst less than or equal to 2/10 to  facilitate minimal limitation in daily activity secondary to pain symptoms. Baseline: See objective data Goal status: Ongoing 09/05/2022   2. Patient will demonstrate independent use of home exercise program to facilitate ability to maintain/progress functional gains from skilled physical therapy services. Baseline: See objective data Goal status: Ongoing 09/05/2022   3. Patient will demonstrate FOTO outcome > or = 62% to indicate reduced disability due to condition. Baseline: See objective data Goal status: MET 09/05/2022   4.  Patient will demonstrate left knee MMT 5/5 to faciltiate usual transfers, stairs, squatting at [facility] for daily life.  Baseline: See objective data Goal status: Ongoing 09/05/2022   5.  Patient ambulate >1000' and negotiates ramps, curbs & stairs without device independently.  Baseline: See objective data Goal status: Ongoing 09/05/2022   6.  patient will be able to demonstrate work related tasks including short distance run.  Baseline: See objective data Goal status: Ongoing 09/05/2022    PLAN:  PT FREQUENCY:  2x/week  PT DURATION: 8 weeks  PLANNED INTERVENTIONS: Therapeutic exercises, Therapeutic activity, Neuro Muscular re-education, Balance training, Gait training, Patient/Family education, Joint mobilization, Stair training, DME instructions, Dry Needling, Electrical stimulation, Traction, Cryotherapy, vasopneumatic deviceMoist heat, Taping, Ultrasound, Ionotophoresis 4mg /ml Dexamethasone, and aquatic therapy, Manual therapy.  All included unless contraindicated  PLAN FOR NEXT SESSION: continue with modified jump using leg press machine;  Progressive strengthening continued in WB.  Improve compliant surface and dynamic balance.    Vladimir Faster, PT, DPT 09/15/2022, 1:52 PM

## 2022-09-20 ENCOUNTER — Encounter: Payer: Self-pay | Admitting: Rehabilitative and Restorative Service Providers"

## 2022-09-20 ENCOUNTER — Ambulatory Visit: Payer: BC Managed Care – PPO | Admitting: Rehabilitative and Restorative Service Providers"

## 2022-09-20 ENCOUNTER — Ambulatory Visit: Payer: BC Managed Care – PPO | Admitting: Physician Assistant

## 2022-09-20 DIAGNOSIS — M6281 Muscle weakness (generalized): Secondary | ICD-10-CM

## 2022-09-20 DIAGNOSIS — M25562 Pain in left knee: Secondary | ICD-10-CM

## 2022-09-20 DIAGNOSIS — M25662 Stiffness of left knee, not elsewhere classified: Secondary | ICD-10-CM

## 2022-09-20 DIAGNOSIS — R2689 Other abnormalities of gait and mobility: Secondary | ICD-10-CM

## 2022-09-20 DIAGNOSIS — R600 Localized edema: Secondary | ICD-10-CM

## 2022-09-20 DIAGNOSIS — Z96652 Presence of left artificial knee joint: Secondary | ICD-10-CM

## 2022-09-20 DIAGNOSIS — R2681 Unsteadiness on feet: Secondary | ICD-10-CM

## 2022-09-20 NOTE — Therapy (Signed)
OUTPATIENT PHYSICAL THERAPY TREATMENT NOTE /DISCHARGE   Patient Name: Anna Glover MRN: [id number] DOB:1975-05-09, 47 y.o., female Today's Date: 09/20/2022  PHYSICAL THERAPY DISCHARGE SUMMARY  Visits from Start of Care: 11  Current functional level related to goals / functional outcomes: See note   Remaining deficits: See note   Education / Equipment: HEP  Patient goals were met. Patient is being discharged due to being pleased with the current functional level.   END OF SESSION:  PT End of Session - 09/20/22 0959     Visit Number 11    Number of Visits 16    Date for PT Re-Evaluation 09/30/22    Authorization Type BCBS state    Authorization Time Period $52 copay    Progress Note Due on Visit 15    PT Start Time 0956    PT Stop Time 1035    PT Time Calculation (min) 39 min    Activity Tolerance Patient tolerated treatment well    Behavior During Therapy [facility] for tasks assessed/performed                     Past Medical History:  Diagnosis Date   Anxiety    Depression    Hypertension    Hypothyroidism    Osteoarthritis 2012   L knee   Past Surgical History:  Procedure Laterality Date   ADENOIDECTOMY  1991   BIOPSY  01/04/2021   Procedure: BIOPSY;  Surgeon: Tressia Danas, MD;  Location: [facility] ENDOSCOPY;  Service: Gastroenterology;;   CESAREAN SECTION  02/25/2003   CESAREAN SECTION  05/01/2009   CHOLECYSTECTOMY  07/15/2012   ESOPHAGOGASTRODUODENOSCOPY (EGD) WITH PROPOFOL N/A 01/04/2021   Procedure: ESOPHAGOGASTRODUODENOSCOPY (EGD) WITH PROPOFOL;  Surgeon: Tressia Danas, MD;  Location: [facility] ENDOSCOPY;  Service: Gastroenterology;  Laterality: N/A;   TOTAL KNEE ARTHROPLASTY Left 07/18/2022   Procedure: LEFT TOTAL KNEE REPLACEMENT;  Surgeon: Tarry Kos, MD;  Location: [facility] OR;  Service: Orthopedics;  Laterality: Left;   Patient Active Problem List   Diagnosis Date Noted   Chest pain, rule out acute myocardial infarction 07/20/2022   Near  syncope 07/20/2022   Status post total left knee replacement 07/18/2022   Gastritis and gastroduodenitis    Acute esophagitis    RUQ pain    Alcohol dependence syndrome (HCC) 01/03/2021   Essential hypertension 01/03/2021   Hypothyroidism (acquired) 01/03/2021   Depression with anxiety 01/03/2021   Hematemesis 01/02/2021   Acute medial meniscus tear of left knee 06/30/2020   Primary osteoarthritis of right knee 03/31/2020   Primary osteoarthritis of left knee 03/31/2020   Headache 06/03/2014   [facility] spotted fever 06/03/2014   Thrombocytopenia (HCC) 06/03/2014   Elevated LFTs 06/03/2014    PCP: Courtney Paris, NP  REFERRING PROVIDER: Virgia Land. Dorise Hiss  REFERRING DIAG: [phone] (ICD-10-CM) - S/P total knee arthroplasty, left   THERAPY DIAG:  Stiffness of left knee, not elsewhere classified  Muscle weakness (generalized)  Acute pain of left knee  Localized edema  Other abnormalities of gait and mobility  Unsteadiness on feet  Rationale for Evaluation and Treatment: Rehabilitation  ONSET DATE: 07/18/2022 L TKA   SUBJECTIVE:   SUBJECTIVE STATEMENT: Pt indicated having no pain complaints in last week. She felt ready to to discharge to HEP.   PERTINENT HISTORY: TKA left 07/18/22, Anxiety, depression, HTN, OA, [facility] Spotted Fever with no ongoing  PAIN:  NPRS scale:  0/10 Pain location: left knee medial & lateral joint line Pain description:  sharp & pulling Aggravating factors: stretches, standing or walking >15 min Relieving factors: sit down, tylenol & at night muscle relaxor, elevation, ice  PRECAUTIONS: Fall  WEIGHT BEARING RESTRICTIONS: Yes LLE WBAT  FALLS:  Has patient fallen in last 6 months? 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 4 cm, below knee 42.4 cm RLE: above knee 48.4cm,  around knee 43.2 cm, below knee 39.4 cm  POSTURE: 08/08/2022 weight shift right  PALPATION: 08/08/2022 Tenderness along joint line and incision  LOWER EXTREMITY ROM:   ROM Right 08/08/2022 Left 08/08/2022 Left 08/11/22 Left 08/15/22 Left 08/22/22  Hip flexion       Hip extension       Hip abduction       Hip adduction       Hip internal rotation       Hip external rotation       Knee flexion  Seated A: 98* P: 104* Supine AAROM 115 deg mild pain A: 111 deg no pain, supine Supine A: 111* P: 114*  Knee extension  Seated A: quad set -6* LAQ -9* P: -4*   Seated LAQ -3* Supine P: 0*  Ankle dorsiflexion       Ankle plantarflexion       Ankle inversion       Ankle eversion        (Blank rows = not tested)  LOWER EXTREMITY MMT:  MMT Left 08/08/2022 Right 08/22/22 Left 08/22/22 Left 09/20/2022  Hip flexion      Hip extension      Hip abduction      Hip adduction      Hip internal rotation      Hip external rotation      Knee flexion 3-/5   5/5  Knee extension 3-/5 HH dynameter 89.8# HH dynameter 55# 51.8# LLE:RLE 59% 5/5  78, 76.4  lbs  Ankle dorsiflexion      Ankle plantarflexion      Ankle inversion      Ankle eversion       (Blank rows = not tested)  FUNCTIONAL TESTS: 09/13/2022:  5 x sit to stand:  11.09 seconds   08/08/2022: 18 inch chair transfer: 5X sit to stand 17.06 sec Lt SLS: 23 sec Rt SLS: 17 sec  GAIT: 08/08/2022 Distance walked: 200' Assistive device utilized: None Level of assistance: SBA cues only for deviations Comments: antalgic with decreased LLE stance, left knee flexed in stance, decreased left knee flexion in swing  TODAY'S TREATMENT                                  DATE: 09/20/2022 Therapeutic Exercise: Recumbent bike seat 9 level 4 for 10 min Leg press with back flat BLEs 100# 15 reps; double leg jump 75 lbs with single leg eccentric loading x 15 bilateral   Discussion on run/walk program for 5k as well as continued cross training.   Neuromuscular Re-education: Blazepod lateral shuttle drill reactive to 4 lights with squat to touch 30 sec x 4 with 15 second rest breaks  Blazepod SLS reactive 3 lights anteriorly 30 sec x 2 bilateral with 15 sec rest breaks  Extra time for set up and recovery after each exercise  TODAY'S TREATMENT                                  DATE: 09/15/2022 Therapeutic Exercise: Recumbent bike seat 9 level 4 for 8 min LLE Step up, over & down on BOSU round side up without UE support 15 reps LLE Lateral step up with RLE reaching across LLE ant with knee ext & post with slight knee flexion on BOSU round side up with light BUE support 15 reps TRX supported squat over chair 15 reps  Leg press with back flat BLEs 100# 10 reps; BLE jump 75# 15 reps; single leg jump catching with opposite LE 50# 15 reps Lateral stepping squat blue band around knees  20 ft x 3 each way  Knee extension machine double leg up, Lt leg lowering 10 lbs 2 x 10   Neuromuscular Re-education: SLS on foam with vector reaching light touch contralateral leg fwd, lateral, back x  8 each, performed bilaterally with occasional HHA Lateral stepping blazepod reactive lights 3 laterally 30 seconds x 2 bilateral with 15 sec rest periods.  2.5 second timeout on lights.    TREATMENT                                  DATE:  09/07/2022 Therapeutic Exercise: Recumbent bike seat 9 level 4 for 9 min TRX supported squat over chair 2 x 15 Lateral stepping green band around knees  20 ft x 3 each way  Knee extension machine double leg up, Lt leg lowering 10 lbs 2 x 10   Neuromuscular Re-education: SLS with vector reaching light touch contralateral leg fwd, lateral, back x 8 each, performed bilaterally with occasional HHA Lateral stepping blazepod reactive lights 3 laterally 30 seconds x 2 bilateral with 15 sec rest periods.  2.5 second timeout on lights.    TREATMENT                                  DATE:  09/07/2022 Therapeutic Exercise: Recumbent bike seat 9 level 4 for 8 min Side step squat with green T-band around knees 10 reps to right & to left. Seated long sitting V hamstring stretch 1 rep centered, 1 rep over RLE & 1 rep over LLE 20 sec holds Seated piriformis stretch 20 sec hold 1 reps Supine SLR with strap adduction ITB stretch 20 sec hold 2 reps  Neuromuscular Re-education: Slider to 3 cones (ant-lat, lat & post-lat) with stance knee flexion on outward motion  and extension on inward motion 5 reps BLEs.  Therapeutic Activities: Functional squat with green T-band figure 8 around knees 5 reps with cues for midline Lunge kneel bw 2 chairs with intermittent light touch BUE onto pillow 10 reps ea LE.  Kneeling on foam pad with BUE on chair seat for transition. Then to floor for cross-legged sitting like what she needs in school.   Quick side step to 4 pods 30 sec 3 bouts (11 hits, 12 hits & 1 hits) with 60 sec rest bw. Forward / backward suicide lunge to 4 pods 4' apart.   3 reps    PATIENT EDUCATION:  Education details: rationale for interventions, HEP Person  educated: Patient Education method: Explanation, Demonstration, Verbal cues, and Handouts Education comprehension: verbalized understanding, returned demonstration, and verbal cues required  HOME EXERCISE PROGRAM: Access Code: [email or web address] URL: [email or web address] Date: 08/22/2022 Prepared by: Vladimir Faster  Exercises - Supine Straight Leg Raises  - 2-3 x daily - 7 x weekly - 2-3 sets - 10 reps - 5 seconds hold - Seated straight leg lifts  - 2-3 x daily - 7 x weekly - 2-3 sets - 10 reps - 5 seconds hold - Seated Hamstring Stretch with Strap  - 2-4 x daily - 7 x weekly - 2-3 sets - 3 reps - 20-30 seconds hold - Seated Long Arc Quad  - 2-4 x daily - 7 x weekly - 2-3 sets - 10 reps - 5 seconds hold - Gastroc Stretch on Step  - 2 x daily - 7 x weekly - 1 sets - 3 reps - 20-30 seconds hold - Standing Eccentric Heel Raise  - 1 x daily - 7 x weekly - 2 sets - 15 reps - 5 seconds hold - squat over chair like "dirty toilet"  - 1 x daily - 7 x weekly - 2 sets - 15 reps - 5 seconds hold - Seated Hamstring Curl with Anchored Resistance  - 1 x daily - 7 x weekly - 2 sets - 15 reps - 5 seconds hold - Supine Quadriceps Stretch with Strap on Table  - 2 x daily - 7 x weekly - 1 sets - 3 reps - 20-30 seconds hold  ASSESSMENT: CLINICAL IMPRESSION: The patient has attended 11 visits over the course of treatment cycle.  Patient has reported overall improvement to better than pre surgery with global rating of change at +7.  See objective data above for updated information regarding current presentation.   Noted gains in all areas and reaching established goals noted.  Recommend discharge to HEP at this time.     OBJECTIVE IMPAIRMENTS: Abnormal gait, decreased activity tolerance, decreased balance, decreased endurance, decreased knowledge of condition, decreased knowledge of use of DME, decreased mobility, difficulty walking, decreased ROM, decreased strength, increased edema, and pain.   ACTIVITY  LIMITATIONS: carrying, lifting, bending, sitting, standing, squatting, sleeping, stairs, transfers, and locomotion level  PARTICIPATION LIMITATIONS: meal prep, cleaning, driving, community activity, occupation, and recreation  PERSONAL FACTORS: 1-2 comorbidities: see PMH  are also affecting patient's functional outcome.   REHAB POTENTIAL: Good  CLINICAL DECISION MAKING: Stable/uncomplicated  EVALUATION COMPLEXITY: Low   GOALS: Goals reviewed with patient? Yes  SHORT TERM GOALS: (target date for Short term goals 09/02/2022)   1.  Patient will demonstrate independent use of home exercise program to maintain progress from in clinic treatments. Baseline: See objective data Goal status: MET 08/22/2022  2. PROM left knee 0* ext to 115* flexion Baseline: See objective data Goal  status: MET 08/22/2022  3. Interim FOTO 55% Baseline: See objective data Goal status: MET 09/05/2022  LONG TERM GOALS: (target dates for all long term goals  09/30/2022 )   1. Patient will demonstrate/report pain at worst less than or equal to 2/10 to facilitate minimal limitation in daily activity secondary to pain symptoms. Baseline: See objective data Goal status: Met 09/20/2022   2. Patient will demonstrate independent use of home exercise program to facilitate ability to maintain/progress functional gains from skilled physical therapy services. Baseline: See objective data Goal status: Met 09/20/2022   3. Patient will demonstrate FOTO outcome > or = 62% to indicate reduced disability due to condition. Baseline: See objective data Goal status: MET 09/05/2022   4.  Patient will demonstrate left knee MMT 5/5 to faciltiate usual transfers, stairs, squatting at [facility] for daily life.  Baseline: See objective data Goal status: Met 09/20/2022   5.  Patient ambulate >1000' and negotiates ramps, curbs & stairs without device independently.  Baseline: See objective data Goal status: Met 09/20/2022   6.  patient will be  able to demonstrate work related tasks including short distance run.  Baseline: See objective data Goal status: Met 09/20/2022    PLAN:  PT FREQUENCY:  2x/week  PT DURATION: 8 weeks  PLANNED INTERVENTIONS: Therapeutic exercises, Therapeutic activity, Neuro Muscular re-education, Balance training, Gait training, Patient/Family education, Joint mobilization, Stair training, DME instructions, Dry Needling, Electrical stimulation, Traction, Cryotherapy, vasopneumatic deviceMoist heat, Taping, Ultrasound, Ionotophoresis 4mg /ml Dexamethasone, and aquatic therapy, Manual therapy.  All included unless contraindicated  PLAN FOR NEXT SESSION:  HEP discharge.    Chyrel Masson, PT, DPT, OCS, ATC 09/20/22  10:35 AM

## 2022-09-20 NOTE — Progress Notes (Addendum)
Post-Op Visit Note   Patient: Anna Glover           Date of Birth: 08-Dec-1975           MRN: [id number] Visit Date: 09/20/2022 PCP: Courtney Paris, NP   Assessment & Plan:  Chief Complaint:  Chief Complaint  Patient presents with   Left Knee - Routine Post Op   Visit Diagnoses:  1. Status post total left knee replacement     Plan: Patient is a pleasant 47 year old female who comes in today approximately 2 months status post left total knee replacement 07/18/2022.  She has been doing great.  She has been in outpatient physical therapy making great progress.  Examination of her left knee reveals a fully healed surgical scar without complication.  Range of motion 0 to 120 degrees.  She is stable to valgus varus stress.  She is neurovascularly intact distally.  At this point, she will continue to advance with activity as tolerated.  Follow-up with [place] in 4 months when she is 6 months out from surgery.  Dental prophylaxis reinforced.  She plans to get a tattoo and she is 3 months postop and will call us for her doxycycline prescription prior to getting this done.  Follow-Up Instructions: Return in about 4 months (around 01/20/2023).   Orders:  No orders of the defined types were placed in this encounter.  No orders of the defined types were placed in this encounter.   Imaging: No new imaging  PMFS History: Patient Active Problem List   Diagnosis Date Noted   Chest pain, rule out acute myocardial infarction 07/20/2022   Near syncope 07/20/2022   Status post total left knee replacement 07/18/2022   Gastritis and gastroduodenitis    Acute esophagitis    RUQ pain    Alcohol dependence syndrome (HCC) 01/03/2021   Essential hypertension 01/03/2021   Hypothyroidism (acquired) 01/03/2021   Depression with anxiety 01/03/2021   Hematemesis 01/02/2021   Acute medial meniscus tear of left knee 06/30/2020   Primary osteoarthritis of right knee 03/31/2020   Primary osteoarthritis of  left knee 03/31/2020   Headache 06/03/2014   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   Thrombocytopenia (HCC) 06/03/2014   Elevated LFTs 06/03/2014   Past Medical History:  Diagnosis Date   Anxiety    Depression    Hypertension    Hypothyroidism    Osteoarthritis 2012   L knee    Family History  Problem Relation Age of Onset   Hypertension Mother    Hyperlipidemia Mother    Hypertension Father    Heart disease Father    Pulmonary fibrosis Father    Heart attack Father 64   Migraines Neg Hx     Past Surgical History:  Procedure Laterality Date   ADENOIDECTOMY  1991   BIOPSY  01/04/2021   Procedure: BIOPSY;  Surgeon: Tressia Danas, MD;  Location: [facility] ENDOSCOPY;  Service: Gastroenterology;;   CESAREAN SECTION  02/25/2003   CESAREAN SECTION  05/01/2009   CHOLECYSTECTOMY  07/15/2012   ESOPHAGOGASTRODUODENOSCOPY (EGD) WITH PROPOFOL N/A 01/04/2021   Procedure: ESOPHAGOGASTRODUODENOSCOPY (EGD) WITH PROPOFOL;  Surgeon: Tressia Danas, MD;  Location: [facility] ENDOSCOPY;  Service: Gastroenterology;  Laterality: N/A;   TOTAL KNEE ARTHROPLASTY Left 07/18/2022   Procedure: LEFT TOTAL KNEE REPLACEMENT;  Surgeon: Tarry Kos, MD;  Location: [facility] OR;  Service: Orthopedics;  Laterality: Left;   Social History   Occupational History   Occupation: Midwife  Tobacco Use   Smoking status: Former  Current packs/day: 0.00    Types: Cigarettes    Start date: 07/11/1999    Quit date: 07/10/2009    Years since quitting: 13.2   Smokeless tobacco: Never  Vaping Use   Vaping status: Never Used  Substance and Sexual Activity   Alcohol use: Yes    Alcohol/week: 1.0 standard drink of alcohol    Types: 1 Glasses of wine per week    Comment: "too much" - a bottle of wine a night   Drug use: No   Sexual activity: Yes    Birth control/protection: None

## 2022-09-22 ENCOUNTER — Encounter: Payer: BC Managed Care – PPO | Admitting: Physical Therapy

## 2022-09-27 ENCOUNTER — Encounter: Payer: BC Managed Care – PPO | Admitting: Physical Therapy

## 2022-09-29 ENCOUNTER — Encounter: Payer: BC Managed Care – PPO | Admitting: Physical Therapy

## 2022-12-16 ENCOUNTER — Telehealth: Payer: Self-pay | Admitting: Orthopaedic Surgery

## 2022-12-16 NOTE — Telephone Encounter (Signed)
Pt needs to call in and confirm new appt or RS new appt, Dub Mikes is not going to be in office on 12/13 and pt was moved to Xu's schedule that morning. I called pt and left VM for her to callback. Please advise if pt calls back in

## 2022-12-29 ENCOUNTER — Telehealth: Payer: Self-pay | Admitting: Orthopaedic Surgery

## 2022-12-29 NOTE — Telephone Encounter (Signed)
Patient called and letting you know that she had her teeth cleaned yesterday and needs antibiotics. She forgot to call. Also she has been on Vpap from Friday to Tuesday. CB#[phone]

## 2022-12-29 NOTE — Telephone Encounter (Signed)
Notified patient.

## 2022-12-29 NOTE — Telephone Encounter (Signed)
Not sure what vpap is.  Does not need abx after cleaning.  Just needs prior to

## 2023-01-27 ENCOUNTER — Other Ambulatory Visit (INDEPENDENT_AMBULATORY_CARE_PROVIDER_SITE_OTHER): Payer: Self-pay

## 2023-01-27 ENCOUNTER — Ambulatory Visit (INDEPENDENT_AMBULATORY_CARE_PROVIDER_SITE_OTHER): Payer: BC Managed Care – PPO | Admitting: Orthopaedic Surgery

## 2023-01-27 ENCOUNTER — Encounter: Payer: Self-pay | Admitting: Orthopaedic Surgery

## 2023-01-27 ENCOUNTER — Ambulatory Visit: Payer: BC Managed Care – PPO | Admitting: Physician Assistant

## 2023-01-27 DIAGNOSIS — Z96652 Presence of left artificial knee joint: Secondary | ICD-10-CM

## 2023-01-27 NOTE — Progress Notes (Signed)
Post-Op Visit Note   Patient: Anna Glover           Date of Birth: [birth year]           MRN: [id number] Visit Date: 01/27/2023 PCP: Courtney Paris, NP   Assessment & Plan:  Chief Complaint:  Chief Complaint  Patient presents with   Left Knee - Follow-up    Left total knee arthroplasty 07/18/2022   Visit Diagnoses:  1. Status post total left knee replacement     Plan: Chennel is 6 months status post press-fit total knee replacement.  She has no complaints.  Exam of the left knee shows a fully healed surgical scar.  No swelling.  Collaterals are stable.  Excellent range of motion.  X-rays demonstrate stable implant without any complications.  Dental prophylaxis reinforced.  Activity as tolerated.  Follow-up in 6 months with repeat x-rays of the left knee.  Follow-Up Instructions: Return in about 6 months (around 07/28/2023) for with lindsey.   Orders:  Orders Placed This Encounter  Procedures   XR Knee 1-2 Views Left   No orders of the defined types were placed in this encounter.   Imaging: XR Knee 1-2 Views Left Result Date: 01/27/2023 X-rays of the left knee show a stable left total knee replacement in good alignment.    PMFS History: Patient Active Problem List   Diagnosis Date Noted   Chest pain, rule out acute myocardial infarction 07/20/2022   Near syncope 07/20/2022   Status post total left knee replacement 07/18/2022   Gastritis and gastroduodenitis    Acute esophagitis    RUQ pain    Alcohol dependence syndrome (HCC) 01/03/2021   Essential hypertension 01/03/2021   Hypothyroidism (acquired) 01/03/2021   Depression with anxiety 01/03/2021   Hematemesis 01/02/2021   Acute medial meniscus tear of left knee 06/30/2020   Primary osteoarthritis of right knee 03/31/2020   Primary osteoarthritis of left knee 03/31/2020   Headache 06/03/2014   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   Thrombocytopenia (HCC) 06/03/2014   Elevated LFTs 06/03/2014   Past  Medical History:  Diagnosis Date   Anxiety    Depression    Hypertension    Hypothyroidism    Osteoarthritis 2012   L knee    Family History  Problem Relation Age of Onset   Hypertension Mother    Hyperlipidemia Mother    Hypertension Father    Heart disease Father    Pulmonary fibrosis Father    Heart attack Father 23   Migraines Neg Hx     Past Surgical History:  Procedure Laterality Date   ADENOIDECTOMY  1991   BIOPSY  01/04/2021   Procedure: BIOPSY;  Surgeon: Tressia Danas, MD;  Location: [facility] ENDOSCOPY;  Service: Gastroenterology;;   CESAREAN SECTION  02/25/2003   CESAREAN SECTION  05/01/2009   CHOLECYSTECTOMY  07/15/2012   ESOPHAGOGASTRODUODENOSCOPY (EGD) WITH PROPOFOL N/A 01/04/2021   Procedure: ESOPHAGOGASTRODUODENOSCOPY (EGD) WITH PROPOFOL;  Surgeon: Tressia Danas, MD;  Location: [facility] ENDOSCOPY;  Service: Gastroenterology;  Laterality: N/A;   TOTAL KNEE ARTHROPLASTY Left 07/18/2022   Procedure: LEFT TOTAL KNEE REPLACEMENT;  Surgeon: Tarry Kos, MD;  Location: [facility] OR;  Service: Orthopedics;  Laterality: Left;   Social History   Occupational History   Occupation: Midwife  Tobacco Use   Smoking status: Former    Current packs/day: 0.00    Types: Cigarettes    Start date: 07/11/1999    Quit date: 07/10/2009    Years since quitting: 13.5  Smokeless tobacco: Never  Vaping Use   Vaping status: Never Used  Substance and Sexual Activity   Alcohol use: Yes    Alcohol/week: 1.0 standard drink of alcohol    Types: 1 Glasses of wine per week    Comment: "too much" - a bottle of wine a night   Drug use: No   Sexual activity: Yes    Birth control/protection: None

## 2023-03-21 ENCOUNTER — Ambulatory Visit: Payer: Self-pay | Admitting: Physician Assistant

## 2023-03-21 ENCOUNTER — Other Ambulatory Visit (INDEPENDENT_AMBULATORY_CARE_PROVIDER_SITE_OTHER): Payer: Self-pay

## 2023-03-21 ENCOUNTER — Ambulatory Visit: Payer: 59 | Admitting: Physician Assistant

## 2023-03-21 ENCOUNTER — Encounter: Payer: Self-pay | Admitting: Physician Assistant

## 2023-03-21 DIAGNOSIS — M79671 Pain in right foot: Secondary | ICD-10-CM | POA: Diagnosis not present

## 2023-03-21 DIAGNOSIS — M25571 Pain in right ankle and joints of right foot: Secondary | ICD-10-CM | POA: Diagnosis not present

## 2023-03-21 NOTE — Progress Notes (Signed)
 Office Visit Note   Patient: Anna Glover           Date of Birth: 14-Mar-1975           MRN: [id number] Visit Date: 03/21/2023              Requested by: Leron Millman, NP [address] [place],  [place] [address] PCP: Leron Millman, NP   Assessment & Plan: Visit Diagnoses:  1. Pain in right foot   2. Pain in right ankle and joints of right foot     Plan: Patient is 8 months status post left knee replacement with Dr. Jerri.  Her knee replacement is doing quite well.  She is complaining of right medial foot pain x 2 months.  This may coordinate when to when she increased her exercising including high-impact activities.  She does think she had a bruise in this area a while back and has more swelling.  She is focally tender over the talonavicular joint.  Cannot rule out a stress injury.  Ideally would like to get an MRI she would like to defer this at this time.  Could try immobilizing her in a short cam boot with an arch support.  Should follow-up with me in 2 weeks  Follow-Up Instructions: Return in about 2 weeks (around 04/04/2023).   Orders:  Orders Placed This Encounter  Procedures   XR Foot 2 Views Right   No orders of the defined types were placed in this encounter.     Procedures: No procedures performed   Clinical Data: No additional findings.   Subjective: Chief Complaint  Patient presents with   Right Foot - Pain    HPI pleasant 48 year old woman who is status post left total knee replacement.  Comes in today with a 38-month history of right medial knee pain.  Denies any injuries but has significantly upped her exercising including high-impact activities such as running.  Notices the pain is quite severe after she is done these activities  Review of Systems  All other systems reviewed and are negative.    Objective: Vital Signs: There were no vitals taken for this visit.  Physical Exam Constitutional:      Appearance: Normal appearance.  Pulmonary:      Effort: Pulmonary effort is normal.  Skin:    General: Skin is warm and dry.  Neurological:     General: No focal deficit present.     Mental Status: She is alert and oriented to person, place, and time.  Psychiatric:        Mood and Affect: Mood normal.        Behavior: Behavior normal.    Ortho Exam Examination mild soft tissue swelling neck but no ecchymosis at this time.  She is neurovascularly intact she has good dorsiflexion plantarflexion eversion and inversion without any pain.  She is tender along the course of the posterior tibial tendon she is somewhat tender to palpation over the talonavicular joint medially no evidence of cellulitis or infection Specialty Comments:  No specialty comments available.  Imaging: XR Foot 2 Views Right Result Date: 03/21/2023 Radiographs demonstrate well-maintained alignment cannot defy an acute fracture of the navicular some irregularity    PMFS History: Patient Active Problem List   Diagnosis Date Noted   Pain in right ankle and joints of right foot 03/21/2023   Chest pain, rule out acute myocardial infarction 07/20/2022   Near syncope 07/20/2022   Status post total left knee replacement 07/18/2022  Gastritis and gastroduodenitis    Acute esophagitis    RUQ pain    Alcohol dependence syndrome (HCC) 01/03/2021   Essential hypertension 01/03/2021   Hypothyroidism (acquired) 01/03/2021   Depression with anxiety 01/03/2021   Hematemesis 01/02/2021   Acute medial meniscus tear of left knee 06/30/2020   Primary osteoarthritis of right knee 03/31/2020   Primary osteoarthritis of left knee 03/31/2020   Headache 06/03/2014   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   Thrombocytopenia (HCC) 06/03/2014   Elevated LFTs 06/03/2014   Past Medical History:  Diagnosis Date   Anxiety    Depression    Hypertension    Hypothyroidism    Osteoarthritis 2012   L knee    Family History  Problem Relation Age of Onset   Hypertension Mother     Hyperlipidemia Mother    Hypertension Father    Heart disease Father    Pulmonary fibrosis Father    Heart attack Father 100   Migraines Neg Hx     Past Surgical History:  Procedure Laterality Date   ADENOIDECTOMY  1991   BIOPSY  01/04/2021   Procedure: BIOPSY;  Surgeon: Eda Iha, MD;  Location: [facility] ENDOSCOPY;  Service: Gastroenterology;;   CESAREAN SECTION  02/25/2003   CESAREAN SECTION  05/01/2009   CHOLECYSTECTOMY  07/15/2012   ESOPHAGOGASTRODUODENOSCOPY (EGD) WITH PROPOFOL  N/A 01/04/2021   Procedure: ESOPHAGOGASTRODUODENOSCOPY (EGD) WITH PROPOFOL ;  Surgeon: Eda Iha, MD;  Location: [facility] ENDOSCOPY;  Service: Gastroenterology;  Laterality: N/A;   TOTAL KNEE ARTHROPLASTY Left 07/18/2022   Procedure: LEFT TOTAL KNEE REPLACEMENT;  Surgeon: Jerri Kay HERO, MD;  Location: [facility] OR;  Service: Orthopedics;  Laterality: Left;   Social History   Occupational History   Occupation: Midwife  Tobacco Use   Smoking status: Former    Current packs/day: 0.00    Types: Cigarettes    Start date: 07/11/1999    Quit date: 07/10/2009    Years since quitting: 13.7   Smokeless tobacco: Never  Vaping Use   Vaping status: Never Used  Substance and Sexual Activity   Alcohol use: Yes    Alcohol/week: 1.0 standard drink of alcohol    Types: 1 Glasses of wine per week    Comment: too much - a bottle of wine a night   Drug use: No   Sexual activity: Yes    Birth control/protection: None

## 2023-04-04 ENCOUNTER — Ambulatory Visit: Payer: 59 | Admitting: Physician Assistant

## 2023-04-04 ENCOUNTER — Encounter: Payer: Self-pay | Admitting: Physician Assistant

## 2023-04-04 DIAGNOSIS — M25571 Pain in right ankle and joints of right foot: Secondary | ICD-10-CM | POA: Diagnosis not present

## 2023-04-04 NOTE — Progress Notes (Signed)
Office Visit Note   Patient: Anna Glover           Date of Birth: 01-12-1976           MRN: [id number] Visit Date: 04/04/2023              Requested by: Courtney Paris, NP [id number] [address] [place],  [place] [address] PCP: Courtney Paris, NP  Chief Complaint  Patient presents with   Right Foot - Follow-up      HPI: Patient is a pleasant 48 year old woman who follows up today for her right dorsal foot pain.  She has been immobilized in a cam boot does not really have any pain ambulating.  Hard to know if she is improved since she said the pain came on when she started doing more active things.  Assessment & Plan: Visit Diagnoses: Right foot pain.  Plan: She is going to put the orthotics in regular shoes while she has sandals she does when she returns to regular activity again I think she would do well with an MRI but she would like to defer this for now  I did review the patient's x-rays with Dr. Roda Shutters.  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  He did not see any acute fracture she does have some degenerative changes.  He recommended a steroid taper Dosepak.  I discussed with this with the patient she does not like the side effects when she is taking this in the past.  We did discuss trying to take meloxicam every day for a few weeks see if this helps.  She is willing to do this.  She knows not to take other anti-inflammatories with this  Follow-Up Instructions: No follow-ups on file.   Ortho Exam  Patient is alert, oriented, no adenopathy, well-dressed, normal affect, normal respiratory effort. Foot no swelling no erythema compartments are soft and compressible she is neurovascularly intact she does have some tenderness to deep palpation of the medial naviculocuneiform joint good inversion eversion dorsiflexion plantarflexion  Imaging: No results found. No images are attached to the encounter.  Labs: Lab Results  Component Value Date   HGBA1C 5.5 07/08/2022   REPTSTATUS  06/05/2014 FINAL 06/01/2014   REPTSTATUS 06/01/2014 FINAL 06/01/2014   GRAMSTAIN  06/01/2014    CYTOSPIN NO WBC SEEN NO ORGANISMS SEEN Performed at [facility] Performed at [facility]    GRAMSTAIN NO WBC SEEN NO ORGANISMS SEEN CYTOSPIN SLIDE 06/01/2014   CULT  06/01/2014    NO GROWTH 3 DAYS Performed at [facility]      Lab Results  Component Value Date   ALBUMIN 3.7 07/20/2022   ALBUMIN 3.4 (L) 01/04/2021   ALBUMIN 3.7 01/03/2021   PREALBUMIN 28 07/08/2022    Lab Results  Component Value Date   MG 1.9 07/20/2022   No results found for: "VD25OH"  Lab Results  Component Value Date   PREALBUMIN 28 07/08/2022      Latest Ref Rng & Units 07/21/2022    4:49 AM 07/20/2022    3:48 PM 07/19/2022    2:08 AM  CBC EXTENDED  WBC 4.0 - 10.5 K/uL 9.5  14.9  16.5   RBC 3.87 - 5.11 MIL/uL 4.44  4.76  4.67   Hemoglobin 12.0 - 15.0 g/dL 84.6  96.2  95.2   HCT 36.0 - 46.0 % 43.5  45.5  43.4   Platelets 150 - 400 K/uL 189  217  226      There  is no height or weight on file to calculate BMI.  Orders:  No orders of the defined types were placed in this encounter.  No orders of the defined types were placed in this encounter.    Procedures: No procedures performed  Clinical Data: No additional findings.  ROS:  All other systems negative, except as noted in the HPI. Review of Systems  Objective: Vital Signs: There were no vitals taken for this visit.  Specialty Comments:  No specialty comments available.  PMFS History: Patient Active Problem List   Diagnosis Date Noted   Pain in right ankle and joints of right foot 03/21/2023   Chest pain, rule out acute myocardial infarction 07/20/2022   Near syncope 07/20/2022   Status post total left knee replacement 07/18/2022   Gastritis and gastroduodenitis    Acute esophagitis    RUQ pain    Alcohol dependence syndrome (HCC) 01/03/2021   Essential hypertension 01/03/2021   Hypothyroidism  (acquired) 01/03/2021   Depression with anxiety 01/03/2021   Hematemesis 01/02/2021   Acute medial meniscus tear of left knee 06/30/2020   Primary osteoarthritis of right knee 03/31/2020   Primary osteoarthritis of left knee 03/31/2020   Headache 06/03/2014   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COPY;  Service: Gastroenterology;;   CESAREAN SECTION  02/25/2003   CESAREAN SECTION  05/01/2009   CHOLECYSTECTOMY  07/15/2012   ESOPHAGOGASTRODUODENOSCOPY (EGD) WITH PROPOFOL N/A 01/04/2021   Procedure: ESOPHAGOGASTRODUODENOSCOPY (EGD) WITH PROPOFOL;  Surgeon: Tressia Danas, MD;  Location: [facility] ENDOSCOPY;  Service: Gastroenterology;  Laterality: N/A;   TOTAL KNEE ARTHROPLASTY Left 07/18/2022   Procedure: LEFT TOTAL KNEE REPLACEMENT;  Surgeon: Tarry Kos, MD;  Location: [facility] OR;  Service: Orthopedics;  Laterality: Left;   Social History   Occupational History   Occupation: Midwife  Tobacco Use   Smoking status: Former    Current packs/day: 0.00    Types: Cigarettes    Start date: 07/11/1999    Quit date: 07/10/2009    Years since quitting: 13.7   Smokeless tobacco: Never  Vaping Use   Vaping status: Never Used  Substance and Sexual Activity   Alcohol use: Yes    Alcohol/week: 1.0 standard drink of alcohol    Types: 1 Glasses of wine per week    Comment: "too much" - a bottle of  wine a night   Drug use: No   Sexual activity: Yes    Birth control/protection: None

## 2023-04-06 ENCOUNTER — Other Ambulatory Visit: Payer: Self-pay | Admitting: Physician Assistant

## 2023-04-06 MED ORDER — MELOXICAM 15 MG PO TABS: 15.0000 mg | ORAL_TABLET | Freq: Every day | ORAL | 0 refills | Status: AC

## 2023-07-18 ENCOUNTER — Ambulatory Visit: Admitting: Orthopaedic Surgery

## 2023-07-18 ENCOUNTER — Other Ambulatory Visit (INDEPENDENT_AMBULATORY_CARE_PROVIDER_SITE_OTHER): Payer: Self-pay

## 2023-07-18 DIAGNOSIS — Z96652 Presence of left artificial knee joint: Secondary | ICD-10-CM | POA: Diagnosis not present

## 2023-07-18 NOTE — Progress Notes (Signed)
 Post-Op Visit Note   Patient: Anna Glover           Date of Birth: Jul 14, 1975           MRN: [id number] Visit Date: 07/18/2023 PCP: Earlis Glimpse, NP   Assessment & Plan:  Chief Complaint: No chief complaint on file.  Visit Diagnoses:  1. Status post total left knee replacement     Plan: History of Present Illness Anna Glover is a 48 year old female who presents for a one-year follow-up after knee replacement surgery.  She walks and runs without significant issues, though she feels slow. There is no knee swelling or buckling. Her calf appears larger in tighter pants, and her ankle and foot sometimes swell. Red dots are present on her leg, not on the other leg.  Physical Exam MUSCULOSKELETAL: Knee full extension. Knee flexion 115 degrees. Knee scar fully healed. Collateral ligaments intact.  Assessment and Plan Left knee replacement, post-operative follow-up One year post-operative follow-up shows optimal knee function with full extension and flexion. X-ray confirms well-bonded press fit tibial base plate and cemented femoral component. No complications. - Order knee x-ray for one year follow-up. - Schedule follow-up appointment in one year. - Advised antibiotics are not needed for tattoos. - Advised antibiotics are needed for dental work for 1 more year.  Follow-Up Instructions: Return in about 1 year (around 07/17/2024).   Orders:  Orders Placed This Encounter  Procedures   XR Knee 1-2 Views Left   No orders of the defined types were placed in this encounter.   Imaging: XR Knee 1-2 Views Left Result Date: 07/18/2023 X-rays of the left knee show a stable left total knee replacement in good alignment.    PMFS History: Patient Active Problem List   Diagnosis Date Noted   Pain in right ankle and joints of right foot 03/21/2023   Chest pain, rule out acute myocardial infarction 07/20/2022   Near syncope 07/20/2022   Status post total left knee replacement  07/18/2022   Gastritis and gastroduodenitis    Acute esophagitis    RUQ pain    Alcohol dependence syndrome (HCC) 01/03/2021   Essential hypertension 01/03/2021   Hypothyroidism (acquired) 01/03/2021   Depression with anxiety 01/03/2021   Hematemesis 01/02/2021   Acute medial meniscus tear of left knee 06/30/2020   Primary osteoarthritis of right knee 03/31/2020   Primary osteoarthritis of left knee 03/31/2020   Headache 06/03/2014   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   Thrombocytopenia (HCC) 06/03/2014   Elevated LFTs 06/03/2014   Past Medical History:  Diagnosis Date   Anxiety    Depression    Hypertension    Hypothyroidism    Osteoarthritis 2012   L knee    Family History  Problem Relation Age of Onset   Hypertension Mother    Hyperlipidemia Mother    Hypertension Father    Heart disease Father    Pulmonary fibrosis Father    Heart attack Father 36   Migraines Neg Hx     Past Surgical History:  Procedure Laterality Date   ADENOIDECTOMY  1991   BIOPSY  01/04/2021   Procedure: BIOPSY;  Surgeon: Lindle Rhea, MD;  Location: [facility] ENDOSCOPY;  Service: Gastroenterology;;   CESAREAN SECTION  02/25/2003   CESAREAN SECTION  05/01/2009   CHOLECYSTECTOMY  07/15/2012   ESOPHAGOGASTRODUODENOSCOPY (EGD) WITH PROPOFOL  N/A 01/04/2021   Procedure: ESOPHAGOGASTRODUODENOSCOPY (EGD) WITH PROPOFOL ;  Surgeon: Lindle Rhea, MD;  Location: [facility] ENDOSCOPY;  Service: Gastroenterology;  Laterality:  N/A;   TOTAL KNEE ARTHROPLASTY Left 07/18/2022   Procedure: LEFT TOTAL KNEE REPLACEMENT;  Surgeon: Wes Hamman, MD;  Location: [facility] OR;  Service: Orthopedics;  Laterality: Left;   Social History   Occupational History   Occupation: Midwife  Tobacco Use   Smoking status: Former    Current packs/day: 0.00    Types: Cigarettes    Start date: 07/11/1999    Quit date: 07/10/2009    Years since quitting: 14.0   Smokeless tobacco: Never  Vaping Use   Vaping status: Never  Used  Substance and Sexual Activity   Alcohol use: Yes    Alcohol/week: 1.0 standard drink of alcohol    Types: 1 Glasses of wine per week    Comment: "too much" - a bottle of wine a night   Drug use: No   Sexual activity: Yes    Birth control/protection: None

## 2023-10-17 ENCOUNTER — Ambulatory Visit: Admitting: Orthopaedic Surgery

## 2023-10-17 ENCOUNTER — Other Ambulatory Visit (INDEPENDENT_AMBULATORY_CARE_PROVIDER_SITE_OTHER)

## 2023-10-17 DIAGNOSIS — M25561 Pain in right knee: Secondary | ICD-10-CM

## 2023-10-17 DIAGNOSIS — G8929 Other chronic pain: Secondary | ICD-10-CM | POA: Diagnosis not present

## 2023-10-17 MED ORDER — MELOXICAM 15 MG PO TABS: 15.0000 mg | ORAL_TABLET | Freq: Every day | ORAL | 0 refills | Status: AC

## 2023-10-17 NOTE — Progress Notes (Signed)
 Office Visit Note   Patient: Anna Glover           Date of Birth: 1975/07/23           MRN: [id number] Visit Date: 10/17/2023              Requested by: Leron Millman, NP [address] [place],  [place] [address] PCP: Leron Millman, NP   Assessment & Plan: Visit Diagnoses:  1. Chronic pain of right knee   2. Acute pain of right knee     Plan: History of Present Illness Anna Glover is a 48 year old female who presents with right knee pain following a running incident.  She experienced immediate lateral knee pain while running, with a sensation of possible kneecap dislocation. The pain is primarily lateral, with occasional anterior pain during ambulation. Walking and knee flexion worsen the pain, causing significant discomfort. There is no swelling, locking, or catching, unlike her previous meniscus tear. She has not experienced any swelling since the incident.  Physical Exam MUSCULOSKELETAL: Right knee exam shows no joint effusion.  No joint line tenderness.  Collaterals and cruciates are stable.  Normal strength and sensation.  She has pain along the lateral patellofemoral joint.  Results RADIOLOGY Knee X-ray: Degenerative spurring on the lateral side of the knee  Assessment and Plan Right knee lateral compartment osteoarthritis with acute pain Acute lateral knee pain post-running incident. X-ray shows degenerative spurring. No meniscus tear indicated. - Advise rest and avoid running until symptoms improve. - Prescribe meloxicam  for two weeks, one tablet daily. Option to discontinue if symptoms improve. Explained meloxicam  is a stronger NSAID and does not impair driving. - Recommend anti-inflammatory medication and reduced activity for two weeks. - Consider cortisone injection if no improvement. - Order MRI if cortisone injection fails to improve symptoms.  Follow-Up Instructions: No follow-ups on file.   Orders:  Orders Placed This Encounter  Procedures   XR  KNEE 3 VIEW RIGHT   Meds ordered this encounter  Medications   meloxicam  (MOBIC ) 15 MG tablet    Sig: Take 1 tablet (15 mg total) by mouth daily.    Dispense:  14 tablet    Refill:  0      Subjective: Chief Complaint  Patient presents with   Right Knee - Pain    Review of Systems  Constitutional: Negative.   HENT: Negative.    Eyes: Negative.   Respiratory: Negative.    Cardiovascular: Negative.   Endocrine: Negative.   Musculoskeletal: Negative.   Neurological: Negative.   Hematological: Negative.   Psychiatric/Behavioral: Negative.    All other systems reviewed and are negative.    Objective: Vital Signs: There were no vitals taken for this visit.  Physical Exam Vitals and nursing note reviewed.  Constitutional:      Appearance: She is well-developed.  HENT:     Head: Normocephalic and atraumatic.  Pulmonary:     Effort: Pulmonary effort is normal.  Abdominal:     Palpations: Abdomen is soft.  Musculoskeletal:     Cervical back: Neck supple.  Skin:    General: Skin is warm.     Capillary Refill: Capillary refill takes less than 2 seconds.  Neurological:     Mental Status: She is alert and oriented to person, place, and time.  Psychiatric:        Behavior: Behavior normal.        Thought Content: Thought content normal.  Judgment: Judgment normal.     Imaging: XR KNEE 3 VIEW RIGHT Result Date: 10/17/2023 X-rays of the right knee show degenerative spurring in all 3 compartments.  No acute abnormalities.    PMFS History: Patient Active Problem List   Diagnosis Date Noted   Pain in right ankle and joints of right foot 03/21/2023   Chest pain, rule out acute myocardial infarction 07/20/2022   Near syncope 07/20/2022   Status post total left knee replacement 07/18/2022   Gastritis and gastroduodenitis    Acute esophagitis    RUQ pain    Alcohol dependence syndrome (HCC) 01/03/2021   Essential hypertension 01/03/2021   Hypothyroidism  (acquired) 01/03/2021   Depression with anxiety 01/03/2021   Hematemesis 01/02/2021   Acute medial meniscus tear of left knee 06/30/2020   Primary osteoarthritis of right knee 03/31/2020   Primary osteoarthritis of left knee 03/31/2020   Headache 06/03/2014   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   Thrombocytopenia (HCC) 06/03/2014   Elevated LFTs 06/03/2014   Past Medical History:  Diagnosis Date   Anxiety    Depression    Hypertension    Hypothyroidism    Osteoarthritis 2012   L knee    Family History  Problem Relation Age of Onset   Hypertension Mother    Hyperlipidemia Mother    Hypertension Father    Heart disease Father    Pulmonary fibrosis Father    Heart attack Father 66   Migraines Neg Hx     Past Surgical History:  Procedure Laterality Date   ADENOIDECTOMY  1991   BIOPSY  01/04/2021   Procedure: BIOPSY;  Surgeon: Eda Iha, MD;  Location: [facility] ENDOSCOPY;  Service: Gastroenterology;;   CESAREAN SECTION  02/25/2003   CESAREAN SECTION  05/01/2009   CHOLECYSTECTOMY  07/15/2012   ESOPHAGOGASTRODUODENOSCOPY (EGD) WITH PROPOFOL  N/A 01/04/2021   Procedure: ESOPHAGOGASTRODUODENOSCOPY (EGD) WITH PROPOFOL ;  Surgeon: Eda Iha, MD;  Location: [facility] ENDOSCOPY;  Service: Gastroenterology;  Laterality: N/A;   TOTAL KNEE ARTHROPLASTY Left 07/18/2022   Procedure: LEFT TOTAL KNEE REPLACEMENT;  Surgeon: Jerri Kay HERO, MD;  Location: [facility] OR;  Service: Orthopedics;  Laterality: Left;   Social History   Occupational History   Occupation: Midwife  Tobacco Use   Smoking status: Former    Current packs/day: 0.00    Types: Cigarettes    Start date: 07/11/1999    Quit date: 07/10/2009    Years since quitting: 14.2   Smokeless tobacco: Never  Vaping Use   Vaping status: Never Used  Substance and Sexual Activity   Alcohol use: Yes    Alcohol/week: 1.0 standard drink of alcohol    Types: 1 Glasses of wine per week    Comment: too much - a bottle of  wine a night   Drug use: No   Sexual activity: Yes    Birth control/protection: None

## 2023-10-26 ENCOUNTER — Other Ambulatory Visit (HOSPITAL_BASED_OUTPATIENT_CLINIC_OR_DEPARTMENT_OTHER): Payer: Self-pay | Admitting: Student

## 2023-10-26 ENCOUNTER — Ambulatory Visit (HOSPITAL_BASED_OUTPATIENT_CLINIC_OR_DEPARTMENT_OTHER): Admitting: Student

## 2023-10-26 ENCOUNTER — Ambulatory Visit (INDEPENDENT_AMBULATORY_CARE_PROVIDER_SITE_OTHER)

## 2023-10-26 ENCOUNTER — Telehealth: Payer: Self-pay

## 2023-10-26 DIAGNOSIS — G8929 Other chronic pain: Secondary | ICD-10-CM

## 2023-10-26 DIAGNOSIS — M25561 Pain in right knee: Secondary | ICD-10-CM

## 2023-10-26 NOTE — Progress Notes (Signed)
 Chief Complaint: Right knee pain     History of Present Illness:    Anna Glover is a 48 y.o. female who presents to clinic today with right knee pain.  She was seen on 9/2 by Dr. Jerri for pain of the same knee after running incident.  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  This has caused severe pain and she has been unable to put any weight on her right foot.  Pain is worse with the knee in extension.  She has history of a TKA on the contralateral side.   Surgical History:   None of right knee  PMH/PSH/Family History/Social History/Meds/Allergies:    Past Medical History:  Diagnosis Date   Anxiety    Depression    Hypertension    Hypothyroidism    Osteoarthritis 2012   L knee   Past Surgical History:  Procedure Laterality Date   ADENOIDECTOMY  1991   BIOPSY  01/04/2021   Procedure: BIOPSY;  Surgeon: Eda Iha, MD;  Location: [facility] ENDOSCOPY;  Service: Gastroenterology;;   CESAREAN SECTION  02/25/2003   CESAREAN SECTION  05/01/2009   CHOLECYSTECTOMY  07/15/2012   ESOPHAGOGASTRODUODENOSCOPY (EGD) WITH PROPOFOL  N/A 01/04/2021   Procedure: ESOPHAGOGASTRODUODENOSCOPY (EGD) WITH PROPOFOL ;  Surgeon: Eda Iha, MD;  Location: [facility] ENDOSCOPY;  Service: Gastroenterology;  Laterality: N/A;   TOTAL KNEE ARTHROPLASTY Left 07/18/2022   Procedure: LEFT TOTAL KNEE REPLACEMENT;  Surgeon: Jerri Kay CHRISTELLA, MD;  Location: [facility] OR;  Service: Orthopedics;  Laterality: Left;   Social History   Socioeconomic History   Marital status: Married    Spouse name: Terrance    Number of children: 2   Years of education: Masters    Highest education level: Not on file  Occupational History   Occupation: Midwife  Tobacco Use   Smoking status: Former    Current packs/day: 0.00    Types: Cigarettes    Start date: 07/11/1999    Quit date: 07/10/2009    Years since  quitting: 14.3   Smokeless tobacco: Never  Vaping Use   Vaping status: Never Used  Substance and Sexual Activity   Alcohol use: Yes    Alcohol/week: 1.0 standard drink of alcohol    Types: 1 Glasses of wine per week    Comment: too much - a bottle of wine a night   Drug use: No   Sexual activity: Yes    Birth control/protection: None  Other Topics Concern   Not on file  Social History Narrative   Lives at home with husband   Caffeine  use: Drinks coffee every day    Social Drivers of Corporate investment banker Strain: Not on file  Food Insecurity: No Food Insecurity (07/18/2022)   Hunger Vital Sign    Worried About Running Out of Food in the Last Year: Never true    Ran Out of Food in the Last Year: Never true  Transportation Needs: No Transportation Needs (07/18/2022)   PRAPARE - Administrator, Civil Service (Medical): No    Lack of Transportation (Non-Medical): No  Physical Activity: Not on file  Stress: Not on file  Social Connections: Not on file   Family History  Problem Relation Age  of Onset   Hypertension Mother    Hyperlipidemia Mother    Hypertension Father    Heart disease Father    Pulmonary fibrosis Father    Heart attack Father 44   Migraines Neg Hx    Allergies  Allergen Reactions   Cefdinir Other (See Comments)    Rectal bleeding   Erythromycin Nausea And Vomiting   Penicillins Hives   Sulfa Antibiotics Other (See Comments)    Rectal Bleeding    Clindamycin/Lincomycin Nausea And Vomiting   Current Outpatient Medications  Medication Sig Dispense Refill   meloxicam  (MOBIC ) 15 MG tablet Take 1 tablet (15 mg total) by mouth daily. 30 tablet 0   amLODipine  (NORVASC ) 10 MG tablet Take 10 mg by mouth at bedtime.     fluticasone (FLONASE) 50 MCG/ACT nasal spray Place 2 sprays into both nostrils daily.     HYDROcodone -acetaminophen  (NORCO) 5-325 MG tablet Take 1-2 tablets by mouth 3 (three) times daily as needed. 30 tablet 0   levothyroxine   (SYNTHROID ) 175 MCG tablet Take 175 mcg by mouth daily before breakfast.     loratadine  (CLARITIN ) 10 MG tablet Take 10 mg by mouth at bedtime.     meloxicam  (MOBIC ) 15 MG tablet Take 1 tablet (15 mg total) by mouth daily. 14 tablet 0   methocarbamol  (ROBAXIN -750) 750 MG tablet Take 1 tablet (750 mg total) by mouth 3 (three) times daily as needed for muscle spasms. 30 tablet 2   Multiple Vitamin (MULTIVITAMIN) tablet Take 1 tablet by mouth daily.     omeprazole (PRILOSEC OTC) 20 MG tablet Take 20 mg by mouth daily as needed (acid reflux).     ondansetron  (ZOFRAN ) 4 MG tablet Take 1 tablet (4 mg total) by mouth every 8 (eight) hours as needed for nausea or vomiting. 40 tablet 0   oxyCODONE -acetaminophen  (PERCOCET) 5-325 MG tablet Take 1-2 tablets by mouth every 6 (six) hours as needed. To be taken after surgery 40 tablet 0   sertraline  (ZOLOFT ) 50 MG tablet Take 50 mg by mouth at bedtime.     No current facility-administered medications for this visit.   No results found.  Review of Systems:   A ROS was performed including pertinent positives and negatives as documented in the HPI.  Physical Exam :   Constitutional: NAD and appears stated age Neurological: Alert and oriented Psych: Appropriate affect and cooperative There were no vitals taken for this visit.   Comprehensive Musculoskeletal Exam:    Exam of the right knee demonstrates active range of motion from 20 to 100 degrees.  There is a mild to moderate effusion present.  No laxity with varus or valgus stress.  Tenderness over both medial and lateral joint lines.  Patient nonweightbearing and unable to ambulate.  Imaging:   Xray (right knee 4 views): Negative for acute fracture or dislocation.  There is mild medial joint space narrowing with osteophyte formation.   I personally reviewed and interpreted the radiographs.   Assessment:   48 y.o. female with severe right knee pain after she experienced a sudden pop earlier today.   Pain is severe and she is unable to weight-bear.  She does have some known mild degenerative changes within the knee although x-rays today show nothing acute.  Based on her symptoms and exam I do have some suspicion for internal derangement within the knee and will go ahead and proceed with an MRI particularly to evaluate for a meniscal injury or evidence of a subluxation event.  Will  plan to have her follow-up on the MRI with Dr. Jerri.  Discussed potential for cortisone injection, although we also decided to hold off on this until MRI can be reviewed.  Will provide crutches for ambulation.  Plan :    - Obtain MRI of the right knee and follow-up with Dr. Jerri for review and treatment discussion     I personally saw and evaluated the patient, and participated in the management and treatment plan.  Leonce Reveal, PA-C Orthopedics

## 2023-10-26 NOTE — Telephone Encounter (Signed)
 Patient called concerning right knee pain and a pop while she was walking.  Patient has an appt.to see Leonce at [facility].

## 2023-10-31 ENCOUNTER — Ambulatory Visit (HOSPITAL_COMMUNITY)
Admission: RE | Admit: 2023-10-31 | Discharge: 2023-10-31 | Disposition: A | Discharge status: Home / Self Care | Source: Ambulatory Visit | Attending: Student | Admitting: Student

## 2023-10-31 DIAGNOSIS — M25561 Pain in right knee: Secondary | ICD-10-CM | POA: Diagnosis present

## 2023-11-02 ENCOUNTER — Encounter: Payer: Self-pay | Admitting: Orthopaedic Surgery

## 2023-11-07 ENCOUNTER — Ambulatory Visit: Admitting: Orthopaedic Surgery

## 2023-11-07 DIAGNOSIS — S83241A Other tear of medial meniscus, current injury, right knee, initial encounter: Secondary | ICD-10-CM | POA: Diagnosis not present

## 2023-11-07 NOTE — Progress Notes (Signed)
 Office Visit Note   Patient: Anna Glover           Date of Birth: Aug 15, 1975           MRN: [id number] Visit Date: 11/07/2023              Requested by: Leron Millman, NP [address] [place],  [place] [address] PCP: Leron Millman, NP   Assessment & Plan: Visit Diagnoses:  1. Acute medial meniscus tear of right knee, initial encounter     Plan: History of Present Illness Anna Glover is a 48 year old female who presents with acute right knee pain following a popping sensation while walking.  On September 11th, she experienced a sudden popping sensation in her right knee while walking, followed by severe pain rated ten out of ten and an inability to bear weight. The entire lower leg felt unstable. Initial management with ice and elevation was ineffective. She was provided with crutches for mobility. Over the following weeks, pain improved to five to six out of ten but persists, especially with knee flexion.  An MRI was conducted shortly after the incident. Prior to the popping, she had knee pain managed effectively with meloxicam . She experiences pain in the posterior knee when bending and reports numbness and an unusual sensation in the area. Her activities include running, though the pop occurred during walking.  Physical Exam MUSCULOSKELETAL: Right knee swollen with pain in the back of the right knee on flexion.  Medial joint line tenderness.  Trace effusion.  Assessment and Plan Right knee medial meniscal root tear, partial Based on MRI findings, has high likelihood of healing.  Slight extrusion as a result but still functional meniscus.   - Advise wearing a knee brace during activity. - Recommend avoiding running. - Encourage walking and speed walking. - Educate on expected healing time of 6 to 12 weeks.   Follow-Up Instructions: No follow-ups on file.   Orders:  No orders of the defined types were placed in this encounter.  No orders of the defined types were  placed in this encounter.     Procedures: No procedures performed   Clinical Data: No additional findings.   Subjective: Chief Complaint  Patient presents with   Right Knee - Follow-up    MRI review    HPI  Review of Systems  Constitutional: Negative.   HENT: Negative.    Eyes: Negative.   Respiratory: Negative.    Cardiovascular: Negative.   Endocrine: Negative.   Musculoskeletal: Negative.   Neurological: Negative.   Hematological: Negative.   Psychiatric/Behavioral: Negative.    All other systems reviewed and are negative.    Objective: Vital Signs: LMP 10/09/2023 (Exact Date)   Physical Exam Vitals and nursing note reviewed.  Constitutional:      Appearance: She is well-developed.  HENT:     Head: Normocephalic and atraumatic.  Pulmonary:     Effort: Pulmonary effort is normal.  Abdominal:     Palpations: Abdomen is soft.  Musculoskeletal:     Cervical back: Neck supple.  Skin:    General: Skin is warm.     Capillary Refill: Capillary refill takes less than 2 seconds.  Neurological:     Mental Status: She is alert and oriented to person, place, and time.  Psychiatric:        Behavior: Behavior normal.        Thought Content: Thought content normal.        Judgment: Judgment  normal.     Ortho Exam  Specialty Comments:  No specialty comments available.  Imaging: No results found.   PMFS History: Patient Active Problem List   Diagnosis Date Noted   Pain in right ankle and joints of right foot 03/21/2023   Chest pain, rule out acute myocardial infarction 07/20/2022   Near syncope 07/20/2022   Status post total left knee replacement 07/18/2022   Gastritis and gastroduodenitis    Acute esophagitis    RUQ pain    Alcohol dependence syndrome (HCC) 01/03/2021   Essential hypertension 01/03/2021   Hypothyroidism (acquired) 01/03/2021   Depression with anxiety 01/03/2021   Hematemesis 01/02/2021   Acute medial meniscus tear of left knee  06/30/2020   Primary osteoarthritis of right knee 03/31/2020   Primary osteoarthritis of left knee 03/31/2020   Headache 06/03/2014   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   Thrombocytopenia 06/03/2014   Elevated LFTs 06/03/2014   Past Medical History:  Diagnosis Date   Anxiety    Depression    Hypertension    Hypothyroidism    Osteoarthritis 2012   L knee    Family History  Problem Relation Age of Onset   Hypertension Mother    Hyperlipidemia Mother    Hypertension Father    Heart disease Father    Pulmonary fibrosis Father    Heart attack Father 45   Migraines Neg Hx     Past Surgical History:  Procedure Laterality Date   ADENOIDECTOMY  1991   BIOPSY  01/04/2021   Procedure: BIOPSY;  Surgeon: Eda Iha, MD;  Location: [facility] ENDOSCOPY;  Service: Gastroenterology;;   CESAREAN SECTION  02/25/2003   CESAREAN SECTION  05/01/2009   CHOLECYSTECTOMY  07/15/2012   ESOPHAGOGASTRODUODENOSCOPY (EGD) WITH PROPOFOL  N/A 01/04/2021   Procedure: ESOPHAGOGASTRODUODENOSCOPY (EGD) WITH PROPOFOL ;  Surgeon: Eda Iha, MD;  Location: [facility] ENDOSCOPY;  Service: Gastroenterology;  Laterality: N/A;   TOTAL KNEE ARTHROPLASTY Left 07/18/2022   Procedure: LEFT TOTAL KNEE REPLACEMENT;  Surgeon: Jerri Kay HERO, MD;  Location: [facility] OR;  Service: Orthopedics;  Laterality: Left;   Social History   Occupational History   Occupation: Midwife  Tobacco Use   Smoking status: Former    Current packs/day: 0.00    Types: Cigarettes    Start date: 07/11/1999    Quit date: 07/10/2009    Years since quitting: 14.3   Smokeless tobacco: Never  Vaping Use   Vaping status: Never Used  Substance and Sexual Activity   Alcohol use: Yes    Alcohol/week: 1.0 standard drink of alcohol    Types: 1 Glasses of wine per week    Comment: too much - a bottle of wine a night   Drug use: No   Sexual activity: Yes    Birth control/protection: None

## 2023-12-18 ENCOUNTER — Encounter: Payer: Self-pay | Admitting: Radiology

## 2024-01-17 ENCOUNTER — Other Ambulatory Visit: Payer: Self-pay

## 2024-01-17 ENCOUNTER — Emergency Department (HOSPITAL_BASED_OUTPATIENT_CLINIC_OR_DEPARTMENT_OTHER): Admitting: Radiology

## 2024-01-17 ENCOUNTER — Emergency Department (HOSPITAL_BASED_OUTPATIENT_CLINIC_OR_DEPARTMENT_OTHER)
Admission: EM | Admit: 2024-01-17 | Discharge: 2024-01-17 | Disposition: A | Discharge status: Home / Self Care | Attending: Emergency Medicine | Admitting: Emergency Medicine

## 2024-01-17 ENCOUNTER — Emergency Department (HOSPITAL_BASED_OUTPATIENT_CLINIC_OR_DEPARTMENT_OTHER)

## 2024-01-17 DIAGNOSIS — E039 Hypothyroidism, unspecified: Secondary | ICD-10-CM | POA: Diagnosis not present

## 2024-01-17 DIAGNOSIS — R079 Chest pain, unspecified: Secondary | ICD-10-CM

## 2024-01-17 DIAGNOSIS — R404 Transient alteration of awareness: Secondary | ICD-10-CM

## 2024-01-17 DIAGNOSIS — R55 Syncope and collapse: Secondary | ICD-10-CM | POA: Diagnosis not present

## 2024-01-17 DIAGNOSIS — I10 Essential (primary) hypertension: Secondary | ICD-10-CM | POA: Insufficient documentation

## 2024-01-17 DIAGNOSIS — Z79899 Other long term (current) drug therapy: Secondary | ICD-10-CM | POA: Insufficient documentation

## 2024-01-17 DIAGNOSIS — I1A0 Resistant hypertension: Secondary | ICD-10-CM

## 2024-01-17 LAB — PREGNANCY, URINE: Preg Test, Ur: NEGATIVE

## 2024-01-17 LAB — BASIC METABOLIC PANEL WITH GFR
Anion gap: 12 (ref 5–15)
BUN: 17 mg/dL (ref 6–20)
CO2: 26 mmol/L (ref 22–32)
Calcium: 10 mg/dL (ref 8.9–10.3)
Chloride: 101 mmol/L (ref 98–111)
Creatinine, Ser: 0.78 mg/dL (ref 0.44–1.00)
GFR, Estimated: >60 mL/min (ref >60)
Glucose, Bld: 107 mg/dL — ABNORMAL HIGH (ref 70–99)
Potassium: 3.5 mmol/L (ref 3.5–5.1)
Sodium: 139 mmol/L (ref 135–145)

## 2024-01-17 LAB — TROPONIN T, HIGH SENSITIVITY: Troponin T High Sensitivity: <15 ng/L (ref 0–19)

## 2024-01-17 LAB — CBC
HCT: 41.3 % (ref 36.0–46.0)
Hemoglobin: 14.4 g/dL (ref 12.0–15.0)
MCH: 32.4 pg (ref 26.0–34.0)
MCHC: 34.9 g/dL (ref 30.0–36.0)
MCV: 92.8 fL (ref 80.0–100.0)
Platelets: 225 K/uL (ref 150–400)
RBC: 4.45 MIL/uL (ref 3.87–5.11)
RDW: 12.2 % (ref 11.5–15.5)
WBC: 7.5 K/uL (ref 4.0–10.5)
nRBC: 0 % (ref 0.0–0.2)

## 2024-01-17 MED ORDER — ASPIRIN 81 MG PO CHEW
324.0000 mg | CHEWABLE_TABLET | Freq: Once | ORAL | Status: AC
  Administered 2024-01-17: 324 mg via ORAL
  Filled 2024-01-17: qty 4

## 2024-01-17 NOTE — Discharge Instructions (Signed)
 Please continue your medications and follow-up with your primary care doctor.  Return to the emergency department if any worsening or concerning symptoms.

## 2024-01-17 NOTE — ED Provider Notes (Signed)
 Anna Glover Provider Note   CSN: [id number] Arrival date & time: 01/17/24  1844     Patient presents with: Chest Pain   Anna Glover is a 48 y.o. female.  She has a history of hypertension hypothyroidism.  She accidentally has not taken her blood pressure medication for a period of time.  She said she felt very dizzy and unwell this morning.  On the way to work she felt like she spaced out a few times although did not get in an accident.  She has had some left-sided chest pain radiating into her left arm today.  Went to urgent care where they referred her here for further evaluation.  General headache.  No blurry vision double vision no numbness or weakness.  No vomiting or diarrhea no urinary symptoms.  Last menstrual period was 5 days ago  {Add pertinent medical, surgical, social history, OB history to YEP:67052} The history is provided by the patient.  Chest Pain Pain location:  Substernal area Pain quality: aching   Pain radiates to:  L arm Pain severity:  Moderate Onset quality:  Gradual Progression:  Unchanged Chronicity:  New Relieved by:  None tried Ineffective treatments:  None tried Associated symptoms: dizziness and headache   Associated symptoms: no abdominal pain, no fever and no shortness of breath        Prior to Admission medications   Medication Sig Start Date End Date Taking? Authorizing Provider  meloxicam  (MOBIC ) 15 MG tablet Take 1 tablet (15 mg total) by mouth daily. 04/06/23   Persons, Ronal Dragon, PA  amLODipine  (NORVASC ) 10 MG tablet Take 10 mg by mouth at bedtime.    [provider]  fluticasone (FLONASE) 50 MCG/ACT nasal spray Place 2 sprays into both nostrils daily.    [provider]  HYDROcodone -acetaminophen  (NORCO) 5-325 MG tablet Take 1-2 tablets by mouth 3 (three) times daily as needed. 08/02/22   Jule Ronal CROME, PA-C  levothyroxine  (SYNTHROID ) 175 MCG tablet Take 175 mcg by mouth  daily before breakfast.    [provider]  loratadine  (CLARITIN ) 10 MG tablet Take 10 mg by mouth at bedtime.    [provider]  meloxicam  (MOBIC ) 15 MG tablet Take 1 tablet (15 mg total) by mouth daily. 10/17/23   Jerri Kay CHRISTELLA, MD  methocarbamol  (ROBAXIN -750) 750 MG tablet Take 1 tablet (750 mg total) by mouth 3 (three) times daily as needed for muscle spasms. 07/12/22   Jule Ronal CROME, PA-C  Multiple Vitamin (MULTIVITAMIN) tablet Take 1 tablet by mouth daily.    [provider]  omeprazole (PRILOSEC OTC) 20 MG tablet Take 20 mg by mouth daily as needed (acid reflux).    [provider]  ondansetron  (ZOFRAN ) 4 MG tablet Take 1 tablet (4 mg total) by mouth every 8 (eight) hours as needed for nausea or vomiting. 07/12/22   Jule Ronal CROME, PA-C  oxyCODONE -acetaminophen  (PERCOCET) 5-325 MG tablet Take 1-2 tablets by mouth every 6 (six) hours as needed. To be taken after surgery 07/12/22   Jule Ronal CROME, PA-C  sertraline  (ZOLOFT ) 50 MG tablet Take 50 mg by mouth at bedtime.    [provider]    Allergies: Cefdinir, Erythromycin, Penicillins, Sulfa antibiotics, and Clindamycin/lincomycin    Review of Systems  Constitutional:  Negative for fever.  Respiratory:  Negative for shortness of breath.   Cardiovascular:  Positive for chest pain.  Gastrointestinal:  Negative for abdominal pain.  Genitourinary:  Negative for  dysuria.  Neurological:  Positive for dizziness and headaches.    Updated Vital Signs BP (!) 175/93   Pulse 66   Temp 98.3 F (36.8 C) (Temporal)   Resp 17   Ht 6' (1.829 m)   Wt 104.3 kg   LMP 01/14/2024 (Exact Date)   SpO2 100%   BMI 31.19 kg/m   Physical Exam Vitals and nursing note reviewed.  Constitutional:      General: She is not in acute distress.    Appearance: Normal appearance. She is well-developed.  HENT:     Head: Normocephalic and atraumatic.  Eyes:     Conjunctiva/sclera: Conjunctivae normal.   Cardiovascular:     Rate and Rhythm: Normal rate and regular rhythm.     Heart sounds: No murmur heard. Pulmonary:     Effort: Pulmonary effort is normal. No respiratory distress.     Breath sounds: Normal breath sounds.  Abdominal:     Palpations: Abdomen is soft.     Tenderness: There is no abdominal tenderness.  Musculoskeletal:        General: No deformity.     Cervical back: Neck supple.  Skin:    General: Skin is warm and dry.     Capillary Refill: Capillary refill takes less than 2 seconds.  Neurological:     General: No focal deficit present.     Mental Status: She is alert.     Cranial Nerves: No cranial nerve deficit.     Sensory: No sensory deficit.     Motor: No weakness.     (all labs ordered are listed, but only abnormal results are displayed) Labs Reviewed  BASIC METABOLIC PANEL WITH GFR  CBC  PREGNANCY, URINE  TROPONIN T, HIGH SENSITIVITY    EKG: EKG Interpretation Date/Time:  Wednesday January 17 2024 19:05:43 EST Ventricular Rate:  69 PR Interval:  165 QRS Duration:  110 QT Interval:  435 QTC Calculation: 466 R Axis:   98  Text Interpretation: Sinus rhythm Borderline right axis deviation Nonspecific T abnormalities, anterior leads Confirmed by Towana Sharper [phone]) on 01/17/2024 7:08:55 PM  Radiology: No results found.  {Document cardiac monitor, telemetry assessment procedure when appropriate:32947} Procedures   Medications Ordered in the ED - No data to display    {Click here for ABCD2, HEART and other calculators REFRESH Note before signing:1}                              Medical Decision Making Amount and/or Complexity of Data Reviewed Labs: ordered. Radiology: ordered.   This patient complains of ***; this involves an extensive number of treatment Options and is a complaint that carries with it a high risk of complications and morbidity. The differential includes ***  I ordered, reviewed and interpreted labs, which included  *** I ordered medication *** and reviewed PMP when indicated. I ordered imaging studies which included *** and I independently    visualized and interpreted imaging which showed *** Additional history obtained from *** Previous records obtained and reviewed *** I consulted *** and discussed lab and imaging findings and discussed disposition.  Cardiac monitoring reviewed, *** Social determinants considered, *** Critical Interventions: ***  After the interventions stated above, I reevaluated the patient and found *** Admission and further testing considered, ***   {Document critical care time when appropriate  Document review of labs and clinical decision tools ie CHADS2VASC2, etc  Document your independent review of radiology images  and any outside records  Document your discussion with family members, caretakers and with consultants  Document social determinants of health affecting pt's care  Document your decision making why or why not admission, treatments were needed:32947:::1}   Final diagnoses:  None    ED Discharge Orders     None

## 2024-01-17 NOTE — ED Triage Notes (Signed)
 Pt POV reporting L side chest pain and slight pain in L arm, also reporting headache, seen at [facility] and advised to come to ED for further evaluation.

## 2024-01-17 NOTE — ED Notes (Signed)
Patient verbalizes understanding of discharge instructions. Opportunity for questioning and answers were provided. Armband removed by staff, pt discharged from ED. Ambulated out to lobby with family ? ?
